# Patient Record
Sex: Male | Born: 1970 | Race: Black or African American | Hispanic: No | Smoking: Current every day smoker
Health system: Southern US, Community
[De-identification: ages and names within clinical notes are randomized; demographics above are authoritative.]

## PROBLEM LIST (undated history)

## (undated) DIAGNOSIS — K5730 Diverticulosis of large intestine without perforation or abscess without bleeding: Secondary | ICD-10-CM

## (undated) DIAGNOSIS — F32A Depression, unspecified: Secondary | ICD-10-CM

## (undated) DIAGNOSIS — F329 Major depressive disorder, single episode, unspecified: Secondary | ICD-10-CM

## (undated) DIAGNOSIS — N390 Urinary tract infection, site not specified: Secondary | ICD-10-CM

## (undated) DIAGNOSIS — K219 Gastro-esophageal reflux disease without esophagitis: Secondary | ICD-10-CM

## (undated) DIAGNOSIS — I10 Essential (primary) hypertension: Secondary | ICD-10-CM

## (undated) DIAGNOSIS — Z8719 Personal history of other diseases of the digestive system: Secondary | ICD-10-CM

## (undated) DIAGNOSIS — F419 Anxiety disorder, unspecified: Secondary | ICD-10-CM

## (undated) HISTORY — DX: Depression, unspecified: F32.A

## (undated) HISTORY — DX: Anxiety disorder, unspecified: F41.9

## (undated) HISTORY — PX: MOUTH SURGERY: SHX715

## (undated) HISTORY — DX: Major depressive disorder, single episode, unspecified: F32.9

---
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
## 2003-10-29 ENCOUNTER — Emergency Department (HOSPITAL_COMMUNITY): Admission: EM | Admit: 2003-10-29 | Discharge: 2003-10-30 | Payer: Self-pay | Admitting: Emergency Medicine

## 2004-01-25 ENCOUNTER — Emergency Department (HOSPITAL_COMMUNITY): Admission: EM | Admit: 2004-01-25 | Discharge: 2004-01-25 | Payer: Self-pay | Admitting: Emergency Medicine

## 2004-09-05 ENCOUNTER — Ambulatory Visit: Payer: Self-pay | Admitting: Internal Medicine

## 2004-09-05 ENCOUNTER — Inpatient Hospital Stay (HOSPITAL_COMMUNITY): Admission: EM | Admit: 2004-09-05 | Discharge: 2004-09-12 | Payer: Self-pay | Admitting: Emergency Medicine

## 2004-09-20 ENCOUNTER — Ambulatory Visit: Payer: Self-pay | Admitting: Internal Medicine

## 2004-11-30 ENCOUNTER — Emergency Department (HOSPITAL_COMMUNITY): Admission: EM | Admit: 2004-11-30 | Discharge: 2004-11-30 | Payer: Self-pay | Admitting: Emergency Medicine

## 2004-12-24 ENCOUNTER — Emergency Department (HOSPITAL_COMMUNITY): Admission: EM | Admit: 2004-12-24 | Discharge: 2004-12-24 | Payer: Self-pay | Admitting: Emergency Medicine

## 2004-12-28 ENCOUNTER — Emergency Department (HOSPITAL_COMMUNITY): Admission: EM | Admit: 2004-12-28 | Discharge: 2004-12-29 | Payer: Self-pay | Admitting: Emergency Medicine

## 2005-05-02 ENCOUNTER — Emergency Department: Payer: Self-pay | Admitting: Emergency Medicine

## 2005-05-03 ENCOUNTER — Emergency Department: Payer: Self-pay | Admitting: Emergency Medicine

## 2005-06-29 ENCOUNTER — Emergency Department (HOSPITAL_COMMUNITY): Admission: EM | Admit: 2005-06-29 | Discharge: 2005-06-29 | Payer: Self-pay | Admitting: Emergency Medicine

## 2005-07-17 ENCOUNTER — Emergency Department: Payer: Self-pay | Admitting: Emergency Medicine

## 2005-10-09 ENCOUNTER — Emergency Department (HOSPITAL_COMMUNITY): Admission: EM | Admit: 2005-10-09 | Discharge: 2005-10-09 | Payer: Self-pay | Admitting: Emergency Medicine

## 2005-12-27 ENCOUNTER — Emergency Department (HOSPITAL_COMMUNITY): Admission: EM | Admit: 2005-12-27 | Discharge: 2005-12-27 | Payer: Self-pay | Admitting: Emergency Medicine

## 2006-07-06 ENCOUNTER — Emergency Department (HOSPITAL_COMMUNITY): Admission: EM | Admit: 2006-07-06 | Discharge: 2006-07-06 | Payer: Self-pay | Admitting: Family Medicine

## 2006-09-10 ENCOUNTER — Emergency Department (HOSPITAL_COMMUNITY): Admission: EM | Admit: 2006-09-10 | Discharge: 2006-09-10 | Payer: Self-pay | Admitting: Emergency Medicine

## 2006-12-11 ENCOUNTER — Inpatient Hospital Stay (HOSPITAL_COMMUNITY): Admission: EM | Admit: 2006-12-11 | Discharge: 2006-12-16 | Payer: Self-pay | Admitting: Emergency Medicine

## 2006-12-14 ENCOUNTER — Encounter (INDEPENDENT_AMBULATORY_CARE_PROVIDER_SITE_OTHER): Payer: Self-pay | Admitting: Gastroenterology

## 2007-05-31 ENCOUNTER — Inpatient Hospital Stay (HOSPITAL_COMMUNITY): Admission: RE | Admit: 2007-05-31 | Discharge: 2007-06-06 | Payer: Self-pay | Admitting: Surgery

## 2007-05-31 ENCOUNTER — Encounter (INDEPENDENT_AMBULATORY_CARE_PROVIDER_SITE_OTHER): Payer: Self-pay | Admitting: Surgery

## 2007-05-31 HISTORY — PX: OTHER SURGICAL HISTORY: SHX169

## 2007-06-15 ENCOUNTER — Emergency Department (HOSPITAL_COMMUNITY): Admission: EM | Admit: 2007-06-15 | Discharge: 2007-06-15 | Payer: Self-pay | Admitting: Family Medicine

## 2007-06-29 ENCOUNTER — Emergency Department (HOSPITAL_COMMUNITY): Admission: EM | Admit: 2007-06-29 | Discharge: 2007-06-29 | Payer: Self-pay | Admitting: Emergency Medicine

## 2007-08-15 ENCOUNTER — Emergency Department (HOSPITAL_COMMUNITY): Admission: EM | Admit: 2007-08-15 | Discharge: 2007-08-15 | Payer: Self-pay | Admitting: Emergency Medicine

## 2007-09-06 ENCOUNTER — Emergency Department (HOSPITAL_COMMUNITY): Admission: EM | Admit: 2007-09-06 | Discharge: 2007-09-06 | Payer: Self-pay | Admitting: Family Medicine

## 2007-09-26 ENCOUNTER — Emergency Department (HOSPITAL_COMMUNITY): Admission: EM | Admit: 2007-09-26 | Discharge: 2007-09-26 | Payer: Self-pay | Admitting: Emergency Medicine

## 2007-11-12 ENCOUNTER — Other Ambulatory Visit: Payer: Self-pay

## 2007-11-12 ENCOUNTER — Emergency Department: Payer: Self-pay | Admitting: Emergency Medicine

## 2007-11-19 ENCOUNTER — Emergency Department (HOSPITAL_COMMUNITY): Admission: EM | Admit: 2007-11-19 | Discharge: 2007-11-19 | Payer: Self-pay | Admitting: Emergency Medicine

## 2007-12-28 ENCOUNTER — Emergency Department: Payer: Self-pay | Admitting: Internal Medicine

## 2007-12-28 ENCOUNTER — Emergency Department (HOSPITAL_COMMUNITY): Admission: EM | Admit: 2007-12-28 | Discharge: 2007-12-29 | Payer: Self-pay | Admitting: Emergency Medicine

## 2008-03-10 ENCOUNTER — Emergency Department (HOSPITAL_COMMUNITY): Admission: EM | Admit: 2008-03-10 | Discharge: 2008-03-10 | Payer: Self-pay | Admitting: Family Medicine

## 2008-04-03 ENCOUNTER — Emergency Department (HOSPITAL_COMMUNITY): Admission: EM | Admit: 2008-04-03 | Discharge: 2008-04-03 | Payer: Self-pay | Admitting: Emergency Medicine

## 2008-06-10 ENCOUNTER — Emergency Department (HOSPITAL_COMMUNITY): Admission: EM | Admit: 2008-06-10 | Discharge: 2008-06-10 | Payer: Self-pay | Admitting: Emergency Medicine

## 2008-07-22 ENCOUNTER — Emergency Department (HOSPITAL_COMMUNITY): Admission: EM | Admit: 2008-07-22 | Discharge: 2008-07-22 | Payer: Self-pay | Admitting: Family Medicine

## 2008-11-20 ENCOUNTER — Emergency Department: Payer: Self-pay | Admitting: Emergency Medicine

## 2009-12-31 ENCOUNTER — Emergency Department: Payer: Self-pay | Admitting: Emergency Medicine

## 2010-01-18 IMAGING — CR DG FINGER THUMB 2+V*L*
3 series · 3 of 3 positions shown · non-contrast
Comparison: None

CLINICAL DATA: Fell this morning.  Pain left MCP region

LEFT THUMB 2+V

[view not recorded (1 of 3)]
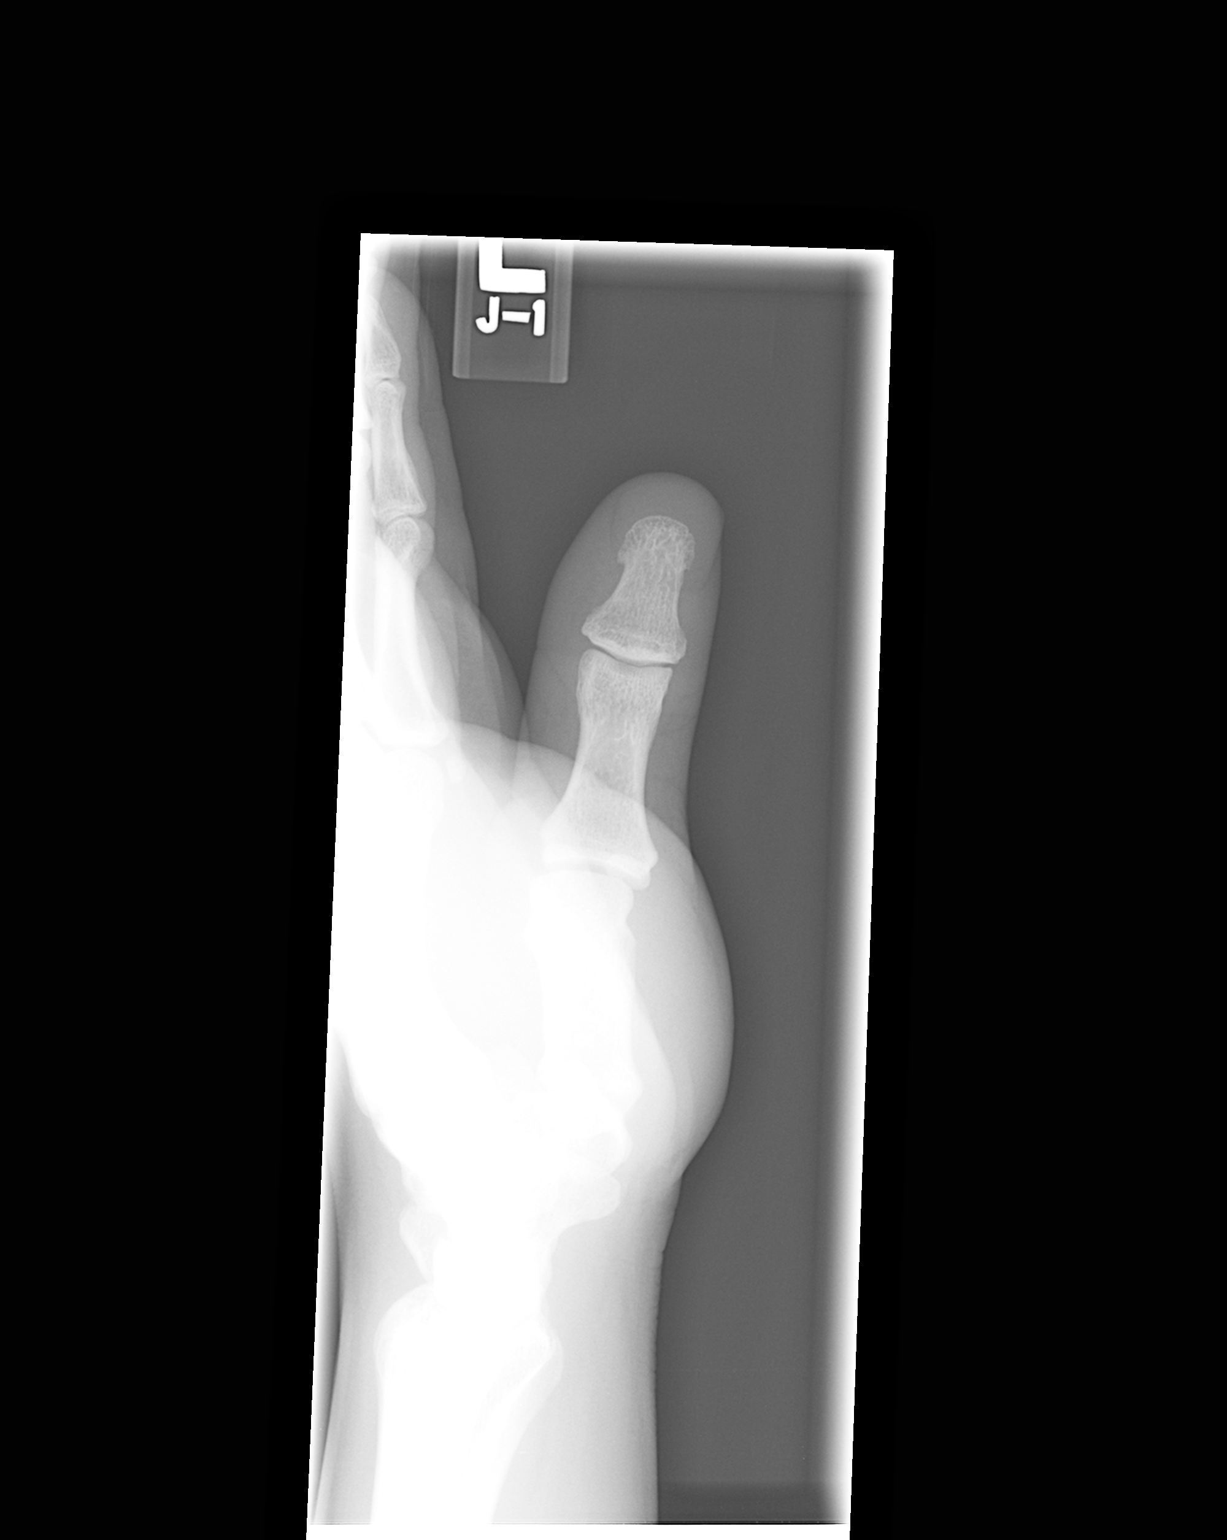

[view not recorded (2 of 3)]
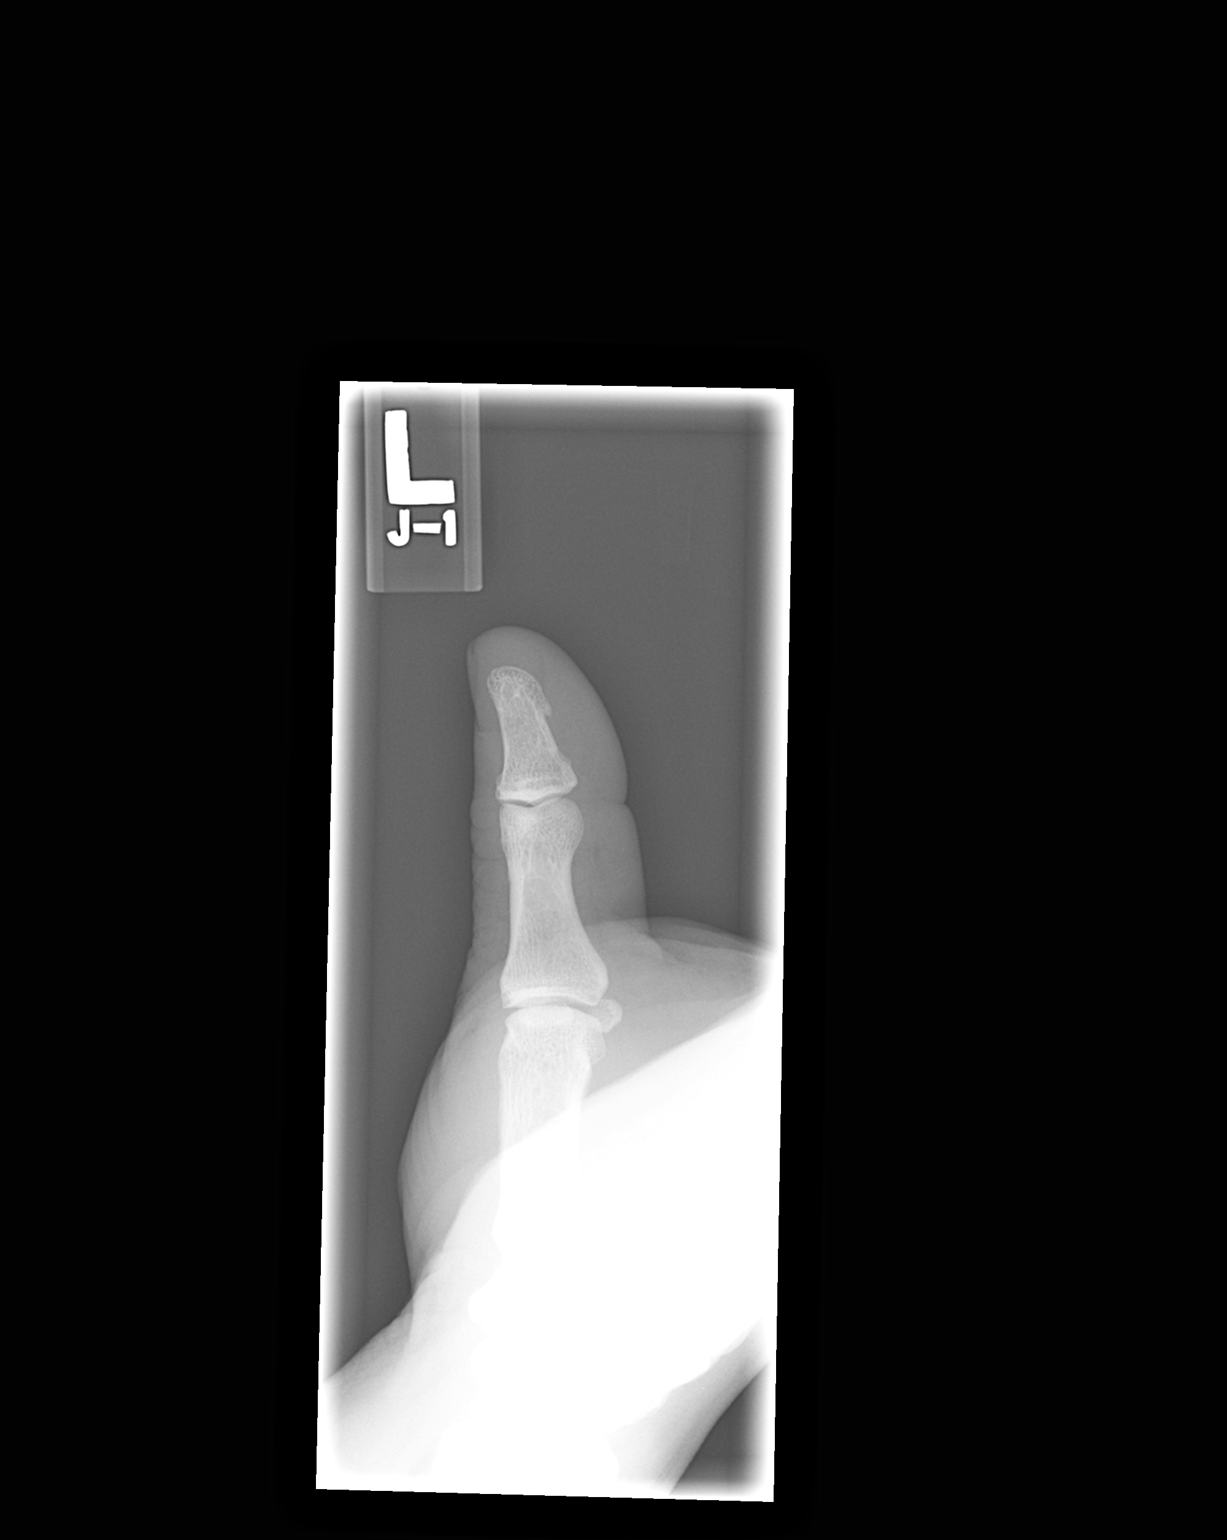

[view not recorded (3 of 3)]
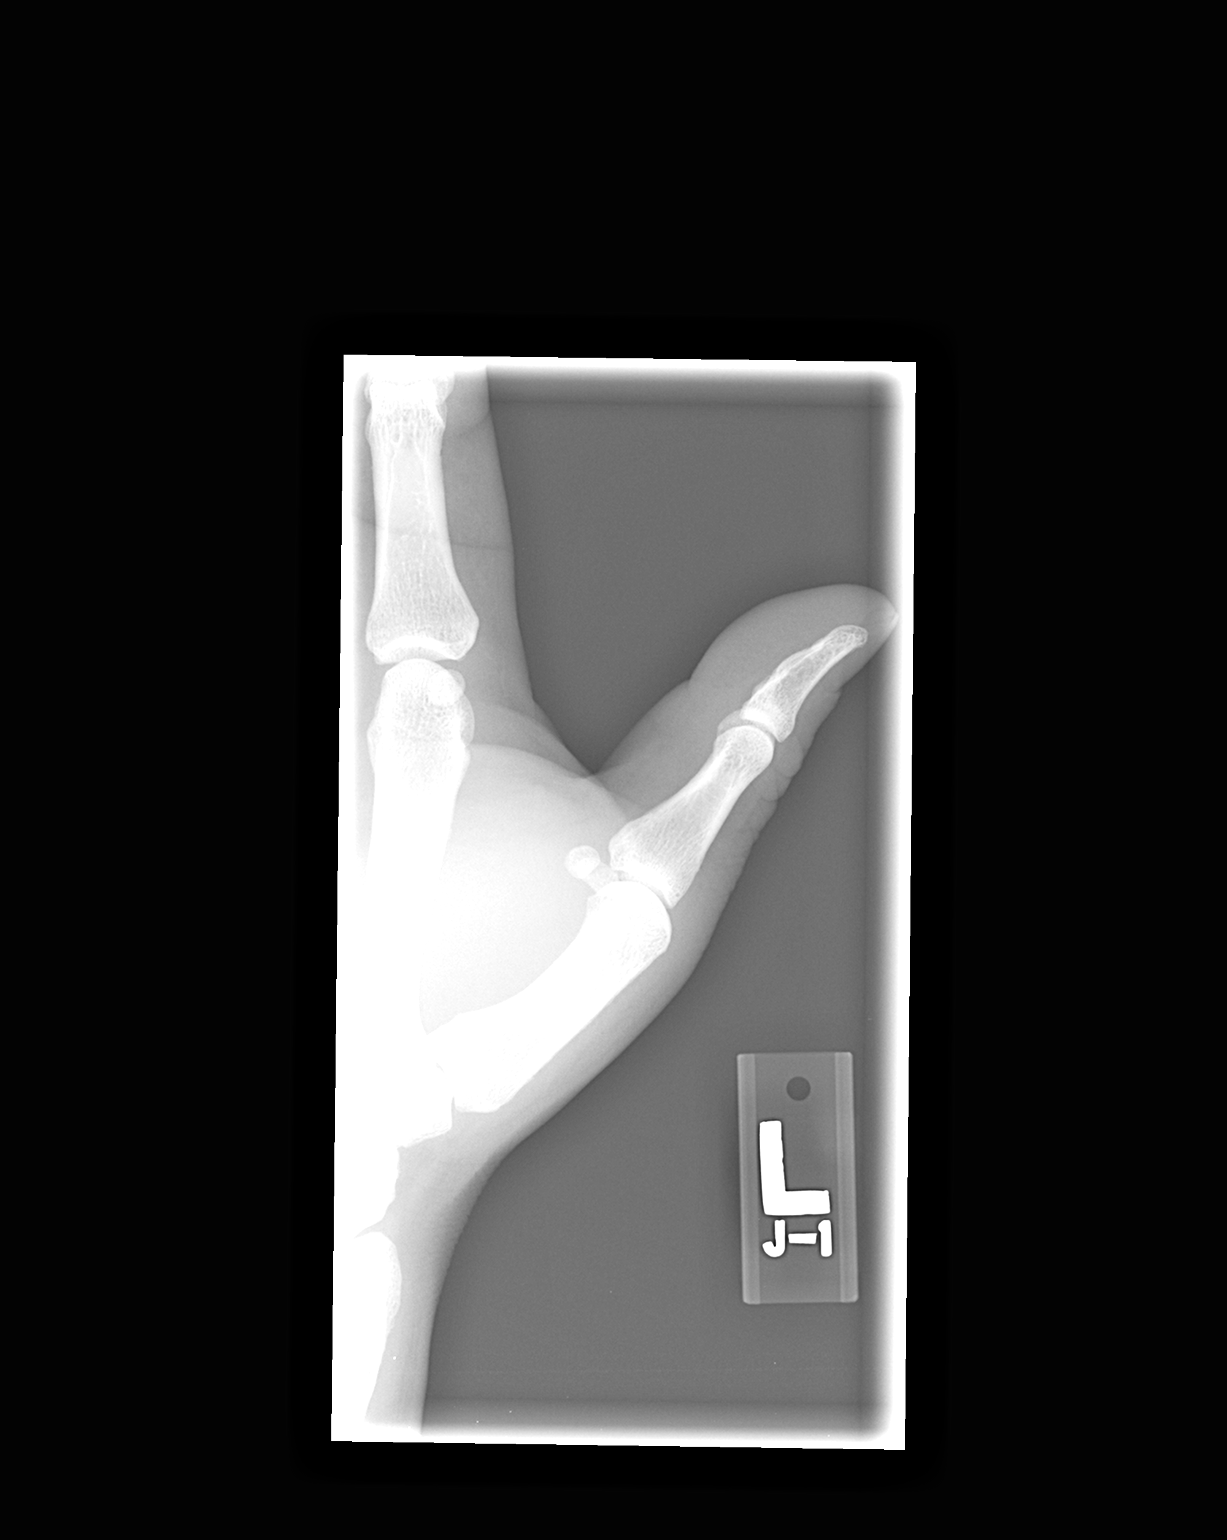

[3 of 3 positions shown; findings below may reference images not displayed]

FINDINGS: No fracture.  On the AP view the proximal phalanx appears
subluxed laterally to a slight degree in relation to the
metacarpal.  I would wonder if this may be due to ligamentous
injury.
IMPRESSION: No fracture.  Query mild pathologic subluxation at the left MCP
articulation.

## 2010-03-26 ENCOUNTER — Emergency Department: Payer: Self-pay | Admitting: Emergency Medicine

## 2010-06-23 LAB — BASIC METABOLIC PANEL
CO2: 24 mEq/L (ref 19–32)
Chloride: 102 mEq/L (ref 96–112)
GFR calc Af Amer: 52 mL/min — ABNORMAL LOW (ref >60)
Glucose, Bld: 118 mg/dL — ABNORMAL HIGH (ref 70–99)
Sodium: 132 mEq/L — ABNORMAL LOW (ref 135–145)

## 2010-06-23 LAB — CBC
HCT: 45.9 % (ref 39.0–52.0)
Hemoglobin: 15.6 g/dL (ref 13.0–17.0)
MCHC: 33.9 g/dL (ref 30.0–36.0)
MCV: 87.3 fL (ref 78.0–100.0)
Platelets: 269 10*3/uL (ref 150–400)
RBC: 5.26 MIL/uL (ref 4.22–5.81)
RDW: 14.4 % (ref 11.5–15.5)
WBC: 10.5 10*3/uL (ref 4.0–10.5)

## 2010-06-27 LAB — CBC
HCT: 45.8 % (ref 39.0–52.0)
Hemoglobin: 14.9 g/dL (ref 13.0–17.0)
MCHC: 32.4 g/dL (ref 30.0–36.0)
MCV: 89.2 fL (ref 78.0–100.0)
RBC: 5.14 MIL/uL (ref 4.22–5.81)

## 2010-06-27 LAB — BASIC METABOLIC PANEL
CO2: 26 mEq/L (ref 19–32)
Calcium: 9.3 mg/dL (ref 8.4–10.5)
Chloride: 104 mEq/L (ref 96–112)
GFR calc Af Amer: >60 mL/min (ref >60)
Potassium: 4.8 mEq/L (ref 3.5–5.1)
Sodium: 137 mEq/L (ref 135–145)

## 2010-06-27 LAB — DIFFERENTIAL
Basophils Relative: 1 % (ref 0–1)
Monocytes Absolute: 0.5 10*3/uL (ref 0.1–1.0)
Monocytes Relative: 10 % (ref 3–12)
Neutro Abs: 2.6 10*3/uL (ref 1.7–7.7)

## 2010-07-26 NOTE — Op Note (Signed)
NAMEMarland Kitchen  GIOVONI, BUNCH                  ACCOUNT NO.:  [id number]   MEDICAL RECORD NO.:  [id number]          PATIENT TYPE:  INP   LOCATION:  5705                         FACILITY:  [facility]   PHYSICIAN:  John C. Madilyn Fireman, M.D.    DATE OF BIRTH:  06/18/1970   DATE OF PROCEDURE:  12/14/2006  DATE OF DISCHARGE:                               OPERATIVE REPORT   INDICATIONS FOR PROCEDURE:  Patient with presentation of a third bout of  acute abdominal pain associated with CT abnormality to the left and  possibly right colon, previously presumed secondary to diverticulitis  but with current skip areas.  Also, suspicious for Crohn's colitis.   PROCEDURE:  The patient was placed in the left lateral decubitus  position and placed on the pulse monitor with continuous low flow oxygen  delivered by nasal cannula.  He was sedated with 100 mcg IV Fentanyl and  10 mg IV Versed, in addition to the 12.5 mg IV Phenergan.  The Olympus  video colonoscope was inserted into the rectum and advanced to the  cecum, confirmed by transillumination of McBurney's point and  visualization of the ileocecal valve and appendiceal orifice.  The prep  was good.  The terminal ileum was intubated and explored for several  centimeters and appeared normal.  Basically, the cecum, ascending, and  transverse colon appeared normal with no masses, polyps, diverticula or  any visible signs of inflammation.  Within the descending and sigmoid  colon there were a few scattered diverticula with no strictures, visible  edema, erythema, or other signs of inflammation.  The rectum likewise  appeared normal and retroflexed view of the anus revealed no obvious  internal hemorrhoids.  Due to the thickening or inflammation suggested  on CT scan, biopsies were taken of the ascending, descending, and  sigmoid colon respectively and sent in separate specimen containers.  The scope was then withdrawn and the patient returned to the recovery  room in stable  condition.  He tolerated the procedure well.  There were  no immediate complications.   IMPRESSION:  Remarkably normal colonoscopy with the exception of sigmoid  diverticulosis.  No definite endoscopic impression of colitis or any  visible abnormality strongly suggestive of diverticulitis with terminal  ileum appearing normal.   PLAN:  We will continue to treat for presumed diverticulitis and await  biopsy results.           ______________________________  Everardo All Madilyn Fireman, M.D.     JCH/MEDQ  D:  12/14/2006  T:  12/14/2006  Job:  [id number]   cc:   [name], MD

## 2010-07-26 NOTE — Discharge Summary (Signed)
NAMEMarland Harmon  RAEF, SPRIGG                  ACCOUNT NO.:  [id number]   MEDICAL RECORD NO.:  [id number]          PATIENT TYPE:  INP   LOCATION:  5705                         FACILITY:  [facility]   PHYSICIAN:  John C. Madilyn Fireman, M.D.    DATE OF BIRTH:  10/11/1970   DATE OF ADMISSION:  12/11/2006  DATE OF DISCHARGE:  12/16/2006                               DISCHARGE SUMMARY   HISTORY OF PRESENT ILLNESS:  The patient is a 40 year old black male who  during evaluation for possible surgical resection of presumed recurrent  diverticulitis as an outpatient developed sudden onset of left-sided  abdominal pain consistent with his two previous presentations and was  admitted for further evaluation.  His CT scan as on previous ones had  shown thickening of the sigmoid colon as well as air in the bowel wall,  but also some inflammation or thickening of the cecum and ascending  colon area raising a question of possible inflammatory bowel disease.  For details, please see admission history and physical.   COURSE IN HOSPITAL:  The patient was placed on Cipro and Flagyl.  He was  in significant pain the first day, but this improved steadily.  His WBC  count was normal and he was not febrile during this admission.  He was  kept on clear liquid diet and on October 2, we decided to prep him for  colonoscopy on October 3.  This was done and surprisingly showed hardly  any mucosal abnormality.  There were sigmoid diverticula noted but no  endoscopically obvious evidence of inflammation and no visible mucosal  inflammatory abnormalities.  The terminal ileum was intubated and  appeared to be within normal limits.  Due to the thickening on CT scan,  biopsy specimens were obtained and sent in separate bottles from the  cecum, splenic flexure area and sigmoid colon.  These were pending at  the time of discharge.  The patient was continued on Cipro and Flagyl  and did well, and on October 3, 4 and 5 his diet was advanced.  He  was  having some small semi-formed bowel movement, and his pain was  significantly improved and well controlled and on October 5 he was felt  ready for discharge.   DISCHARGE DIAGNOSIS:  Probable diverticulitis, rule out inflammatory  bowel disease.   CONDITION ON DISCHARGE:  Improved.   DISCHARGE MEDICATIONS:  1. Cipro 5 mg b.i.d.  2. Flagyl 500 mg b.i.d.  3. Vicodin p.r.n. for pain.   FOLLOWUP:  Follow up with Dr. Gerrit Friends in 2-3 weeks and Dr. Madilyn Fireman in 2-3  weeks.  Will need to reconsider surgical intervention in light of the  endoscopic findings and pending pathology as well as having his third  clinical episode.           ______________________________  Everardo All. Madilyn Fireman, M.D.     JCH/MEDQ  D:  12/16/2006  T:  12/17/2006  Job:  [id number]   cc:   [name], MD  Graylin Shiver, M.D.

## 2010-07-26 NOTE — Consult Note (Signed)
NAMEMarland Kitchen  MICHELL, KADER                  ACCOUNT NO.:  [id number]   MEDICAL RECORD NO.:  [id number]          PATIENT TYPE:  INP   LOCATION:  5705                         FACILITY:  [facility]   PHYSICIAN:  Lennie Muckle, MD      DATE OF BIRTH:  1971-01-10   DATE OF CONSULTATION:  12/11/2006  DATE OF DISCHARGE:                                 CONSULTATION   CONSULTING SURGEON:  Lennie Muckle, MD   Gastroenterology:  GI.  Primary:  None.   REASON FOR CONSULTATION:  Left upper quadrant abdominal pain.   HISTORY OF PRESENT ILLNESS:  [name] is a 40 year old male patient  known to see [facility] from prior hospitalization in 2006 for diverticulitis  with microperforation and left upper quadrant phlegmon.  At that time  the patient had fever and leukocytosis but was treated successfully with  medication and bowel rest.  He was seen during that admission by Dr.  Gerrit Friends.  The patient apparently has been stable until this summer, where  he presented to the ER in June of this year with similar symptoms but  again was stable without significant fever or leukocytosis and was able  to keep a diet down, so he was discharged on Cipro with recommendations  to follow up with Dr. Gerrit Friends as an outpatient.  The patient finally did  follow up with Dr. Gerrit Friends within the past 4-7 days because of increasing  left upper quadrant pain.  I do not have Dr. Ardine Eng notes but the  patient tells me that he did not feel at this time that the patient's  symptoms were coming from diverticular disease and recommended that he  follow up with GI for a colonoscopy.  He is planned to have a  colonoscopy with [facility] this Thursday but because of increased  severity of left upper quadrant pain, the patient states the pain was so  severe he could not walk, he presented to the ER today.  In the ER the  patient had no leukocytosis and a CT was ordered that showed diffuse  colitis with extension up into the terminal ileum, a pattern  consistent  with possible Crohn disease.  He also has a persistent left upper  quadrant phlegmon, no evidence of microdots or free air.  I have  discussed this patient with the EDP as well as with Dr. Freida Busman and we  recommend that gastroenterology admit the patient for further workup of  the colitis, possible Crohn symptoms.   REVIEW OF SYSTEMS:  The patient reports diarrhea that began on June 28.  He relates this to starting a liquid diet on December 08, 2006.  He  denies starting a bowel prep prior to the onset of the diarrhea.  He  denies fevers or chills or any melena or redness in his stool.   SOCIAL HISTORY:  He smokes one to 1-1/2 packs of cigarettes per day.  He  drinks alcohol socially.  He has a girlfriend.  He is disabled because  of his mental health issues.  He has two small children.  FAMILY HISTORY:  Colon cancer, diabetes and hypertension.   PAST MEDICAL HISTORY:  1. Depression.  2. Constipation.  3. Suspected hemorrhoids.   PAST SURGICAL HISTORY:  Hydrocele repair.   ALLERGIES:  No known drug allergies.   CURRENT MEDICATIONS:  Lexapro and Seroquel.  He obtains these from  [facility]   PHYSICAL EXAM:  GENERAL:  He is a pleasant male patient in obvious pain,  complaining of left upper quadrant abdominal pain.  VITAL SIGNS:  Temperature 98.1, BP 134/87, pulse 87, respirations 16.  NEUROLOGIC:  The patient is alert and oriented x3, moving all  extremities x4 without focal deficits.  HEENT:  Head normocephalic.  Sclera are not injected.  NECK:  Supple with no adenopathy.  CHEST:  Bilateral lung sounds clear to auscultation.  Respiratory effort  is nonlabored.  CARDIAC:  S1 and S2 without rubs, murmurs, thrills or gallops.  Pulse is  regular.  ABDOMEN:  Soft.  Bowel sounds are diminished.  He is tender in the left  middle and left upper quadrants with guarding, no rebounding.  EXTREMITIES:  Symmetrical in appearance.  No edema, cyanosis,  clubbing.  SKIN:  The patient has several tattoos on his extremities and one across  his abdomen that I noted.   LABS:  White count 9600, neutrophils 70%, hemoglobin 14.8.  Sodium 137,  potassium 4.8, CO2 24, glucose 93, BUN 9, creatinine 1.17.  LFTs are  normal.  Urinalysis is negative.  Diagnostic CT of the abdomen and  pelvis as noted.   IMPRESSION:  1. Known diverticular disease and prior left upper quadrant phlegmon,      which is currently stable.  2. Left upper quadrant pain with a new finding of colitis extending to      the terminal ileum, suspicious for possible new-onset Crohn      disease.   PLAN:  1. Admission to GI as noted so they can work up and possibly treat the      colitis first.  2. At this time there is no indication for surgery.  We will continue      to follow along with you.  Once the colitis issues are resolved,      then we can possibly reevaluate for colon surgery if indicated.  At      this time, the CT is not signifiantly changed since his last      imagining study and it seems as though his entire colon is affected      rather than a limited area.  3. Follow up on labs and other workup as noted.      Allison L. Kennith Center, MD  Electronically Signed    ALE/MEDQ  D:  12/11/2006  T:  12/12/2006  Job:  [id number]   cc:   [name], MD

## 2010-07-26 NOTE — H&P (Signed)
NAMEMarland Kitchen  Manuel Harmon, Manuel Harmon                  ACCOUNT NO.:  [id number]   MEDICAL RECORD NO.:  [id number]          PATIENT TYPE:  INP   LOCATION:  5705                         FACILITY:  [facility]   PHYSICIAN:  John C. Madilyn Fireman, M.D.    DATE OF BIRTH:  05/18/1970   DATE OF ADMISSION:  12/11/2006  DATE OF DISCHARGE:                              HISTORY & PHYSICAL   CHIEF COMPLAINT:  Abdominal pain.   HISTORY OF ILLNESS:  The patient is a 40 year old black male who  presents with third bout of left lower quadrant abdominal pain with the  first in 2006 requiring hospitalization and the second one treated in  2008 as an outpatient with Cipro and Flagyl.  On his initial  presentation, he had a CT scan which suggested diverticulitis with a  contained perforation.  After his bout treated as an outpatient in June,  he was referred to Dr. Gerrit Friends for possible surgery.  Dr. Gerrit Friends referred  him to Dr. Evette Cristal who set him for a colonoscopy to be scheduled next  week; however, yesterday morning he began having progressive left lower  quadrant abdominal pain, some small squirts of non-bloody diarrhea with  the pain persisting and worsening during the night and this morning to  where he could barely walk.  He came to the emergency room, was afebrile  with stable vital signs but appeared to be in significant distress with  focal significant tenderness in the left lower quadrant.  He had a  normal temperature, normal white blood cell count, but a CT scan showing  inflammation of the sigmoid and splenic flexure area with, again,  suggesting a contained perforation in the same area as seen in 2006, but  also with some ascending colon inflammation, raising the possibility of  a segmental colitis rather than diverticulitis; however, some  diverticula were noted.  The patient was admitted for inpatient  management and pain control.   PAST MEDICAL HISTORY:  1. Diverticulitis.  2. Anxiety and depression.   MEDICATIONS:   Lexapro, Seroquel.   FAMILY HISTORY:  Noncontributory.   SOCIAL HISTORY:  Patient admits to both alcohol and tobacco use.   ALLERGIES:  NONE KNOWN.   PHYSICAL EXAM:  GENERAL:  A well-developed, well-nourished black male in  no acute distress.  HEART:  Regular rate and rhythm without murmur.  LUNGS:  Clear.  ABDOMEN:  Soft, nondistended with normoactive bowel sounds.  No  hepatosplenomegaly or a mass.  There is marked tenderness with guarding  in the left lower quadrant.   IMPRESSION:  Presumed third bout of diverticulitis versus a segmental  colitis.   PLAN:  Admit, treat his diverticulitis, and when improved will need a  colonoscopy, or at least a sigmoidoscopy, to better discern whether he  has inflammatory bowel disease rather than diverticulitis.  If this is  diverticulitis, he will probably need surgery at some point.           ______________________________  Everardo All. Madilyn Fireman, M.D.     JCH/MEDQ  D:  12/11/2006  T:  12/12/2006  Job:  [id number]  cc:   Graylin Shiver, M.D.  [name], MD

## 2010-07-26 NOTE — Op Note (Signed)
NAMEMarland Kitchen  Manuel, Harmon                  ACCOUNT NO.:  [id number]   MEDICAL RECORD NO.:  [id number]          PATIENT TYPE:  INP   LOCATION:  5156                         FACILITY:  [facility]   PHYSICIAN:  [name], MD      DATE OF BIRTH:  [birth year]   DATE OF PROCEDURE:  05/31/2007  DATE OF DISCHARGE:                               OPERATIVE REPORT   PREOPERATIVE DIAGNOSIS:  Diverticular disease, left colon and sigmoid  colon.   POSTOPERATIVE DIAGNOSIS:  Diverticular disease, left colon and sigmoid  colon.   PROCEDURE:  1. Left colectomy.  2. Incidental appendectomy.   SURGEON:  [name], MD, FACS   ASSISTANT:  Consuello Bossier, MD, FACS   ANESTHESIA:  General.   ESTIMATED BLOOD LOSS:  100 mL.   PREPARATION:  Betadine.   COMPLICATIONS:  None.   INDICATIONS:  The patient is a 40 year old black male who had been seen  in September 2008 in our office for recurrent episodes of sigmoid  diverticulitis.  The patient was referred to Dr. Dorena Harmon for  colonoscopy.  However, the patient developed recurrent diverticulitis  and was admitted to [facility].  CT scan demonstrated  perforation at the sigmoid flexure which was contained.  There were  changes consistent with acute diverticulitis.  While hospitalized, the  patient underwent bowel prep and colonoscopy by Dr. Dorena Harmon; this  showed some diverticular disease in the sigmoid colon, but otherwise no  significant of abnormality was identified.  The patient improved on  antibiotic therapy and returned to our practice in February 2009 for  reevaluation for segmental colonic resection for treatment of recurrent  episodes of diverticular disease with history of microperforation at  splenic flexure.  The patient now comes to surgery for resection.   BODY OF REPORT:  Procedure is done in OR #16 at the [facility].  The patient is brought to the operating room and  placed in a supine position on the  operating room table.  Following  administration of general anesthesia, the patient is positioned in low  lithotomy and then prepped and draped in the usual strict aseptic  fashion.  After ascertaining that an adequate level of anesthesia had  been achieved, a low midline abdominal incision was made with a #10  blade.  Dissection was carried down through subcutaneous tissues.  Fascia was incised in the midline and peritoneal cavity is entered  cautiously.  Abdomen is explored.  A Balfour retractor is placed for  exposure.  Exploration reveals minimal diverticular disease involving  the sigmoid colon.  The descending colon is mildly thick-walled.  No  gross abnormality is identified.  Likewise, the splenic flexure and  distal transverse colon is mildly thick-walled, but again without gross  abnormality.  After discussion and review of the CT scans from October  2008 intraoperatively, a decision is made to proceed with left  colectomy.  Therefore, the splenic flexure is mobilized by lifting the  omentum off of the bowel and mobilizing it with the LigaSure.  A point  just  past to midway in the transverse colon is selected and the bowel is  transected with a GIA stapler.  Mesentery is taken down so as to  mobilize the entire splenic flexure.  Mesentery is divided with the  LigaSure as well as 2-0 silk suture ligatures.  Descending colon  mesentery is transected with the LigaSure.  Dissection is carried  distally to the sigmoid colon.  Approximately 80% of the sigmoid colon  is resected.  The distal sigmoid is transected with a GIA stapler.  Specimen is marked with sutures proximally and distally and submitted to  Pathology for review.  Good hemostasis is noted.  Balfour retractor is  repositioned.  The transverse colon is mobilized by dividing selected  mesenteric vessels with the LigaSure, providing for mobilization of the  bowel to reach the distal sigmoid colon.  Small bowel is moved to  the  right side of the abdomen.  Next, an end-to-end anastomosis is created  between the distal transverse colon and the distal sigmoid colon.  Previous staple lines are excised.  Anastomosis is created with  interrupted 3-0 silk sutures circumferentially.  Mesenteric defect is  closed with interrupted 2-0 silk sutures.  No tension is present on the  anastomosis.  Anastomosis admits 2 fingertips easily on palpation.  Abdomen is copiously irrigated with warm saline; this is evacuated.  Good hemostasis is noted.  Appendix is mobilized.  The mesoappendix is  divided with the LigaSure.  Base of the appendix is transected with a  GIA stapler and the appendix is submitted to Pathology for review.  Omentum is used to cover the small bowel and the anastomosis.  Midline  wound is closed with interrupted #1 Novofil sutures.  Subcutaneous  tissues are irrigated.  Skin is closed with stainless steel staples.  Sterile dressings are applied.  The patient is awakened from anesthesia  and brought to the recovery room in stable condition.  The patient  tolerated the procedure well.      [name], MD  Electronically Signed     TMG/MEDQ  D:  05/31/2007  T:  06/01/2007  Job:  [id number]   cc:   Everardo All. Madilyn Fireman, M.D.

## 2010-07-29 NOTE — Discharge Summary (Signed)
NAMEMarland Kitchen  Manuel, Harmon                  ACCOUNT NO.:  [id number]   MEDICAL RECORD NO.:  [id number]          PATIENT TYPE:  INP   LOCATION:  5727                         FACILITY:  [facility]   PHYSICIAN:  Yetta Barre, M.D.  DATE OF BIRTH:  [birth year]   DATE OF ADMISSION:  09/05/2004  DATE OF DISCHARGE:                                 DISCHARGE SUMMARY   No dictation for this job number.       SS/MEDQ  D:  09/12/2004  T:  09/12/2004  Job:  [id number]

## 2010-07-29 NOTE — Discharge Summary (Signed)
NAMEMarland Kitchen  DEJOHN, IBARRA                  ACCOUNT NO.:  [id number]   MEDICAL RECORD NO.:  [id number]          PATIENT TYPE:  INP   LOCATION:  5156                         FACILITY:  [facility]   PHYSICIAN:  [name], MD      DATE OF BIRTH:  [birth year]   DATE OF ADMISSION:  05/31/2007  DATE OF DISCHARGE:  06/06/2007                               DISCHARGE SUMMARY   REASON FOR ADMISSION:  Diverticular disease.   HISTORY OF PRESENT ILLNESS:  Manuel Harmon is a 40 year old black male,  well known to my surgical practice.  He has had recurrent episodes of  sigmoid diverticulitis.  He has developed a diverticular stricture.  He  is admitted at this time for sigmoid colectomy for diverticular disease  with stricture.   HOSPITAL COURSE:  The patient was admitted on May 31, 2007.  He was  taken directly to the operating room where he underwent left colectomy  and incidental appendectomy for diverticular disease.  Postoperative  course was relatively straightforward.  The patient began on a clear  liquid diet on the first postoperative day.  He was advanced to full  liquids on the second postoperative day.  The patient had gradual  resolution of his ileus and was finally advanced to a regular diet on  the fifth postoperative day.  He was discharged to home on June 06, 2007, the sixth postoperative day.   DISCHARGE PLAN:  The patient is discharged to home in good condition,  tolerating a regular diet, and ambulating independently.  Discharge  medications include Percocet as needed for pain.  The patient will be  seen back in my office at [facility] for wound check and  staple removal in 5 days.   FINAL PATHOLOGY:  Benign appendix, diverticulosis, and diverticular  stricture.   CONDITION ON DISCHARGE:  Good.      [name], MD  Electronically Signed     TMG/MEDQ  D:  07/31/2007  T:  08/01/2007  Job:  [id number]

## 2010-07-29 NOTE — Consult Note (Signed)
NAME:  Manuel Harmon, Manuel Harmon                  ACCOUNT NO.:  [id number]   MEDICAL RECORD NO.:  [id number]          PATIENT TYPE:  INP   LOCATION:  1829                         FACILITY:  [facility]   PHYSICIAN:  [name], MD      DATE OF BIRTH:  1971-01-11   DATE OF CONSULTATION:  09/05/2004  DATE OF DISCHARGE:                                   CONSULTATION   HPI: Manuel Harmon is a 40 year old male patient who had a two-day history of  progressive left lower quadrant pain as well as nausea and vomiting.  He  presented to the emergency room where a CT scan confirmed an acute left  ascending colon diverticulitis with tiny contained perforation with no  abscess.  The white blood cell count was 17,000 with a left shift.  The  patient remembers another episode in August of 2005 and he states I came  here and was treated and it resolved.  He did not require hospitalization at  that time.   REVIEW OF SYMPTOMS:  No chest pain, no shortness of breath.  Review of  systems otherwise negative.   ALLERGIES:  No known drug allergies.   MEDICATIONS:  None per patient, although the chart shows that he is on  Lexapro.   PAST MEDICAL HISTORY:  1.  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  2.  History of depression.   SOCIAL HISTORY:  No tobacco, alcohol or illicit drug use.  [place]  He is not married.   FAMILY HISTORY:  His dads history is unknown.  Mom has history of  hypertension.   LABORATORY STUDIES:  Normal lipase.  Normal BMET.  White blood cell count  17,000 with a left shift.   PHYSICAL EXAMINATION:  VITAL SIGNS:  T-max 102.8, blood pressure 144/76,  pulse 110, respirations 24, 97% on room air.  HEENT:  Grossly normal.  Sclerae are clear.  Conjunctivae normal.  Nares  without discharge.  NECK:  No carotid or subclavian bruit.  No JVD or thyromegaly.  LUNGS:  Clear to auscultation bilaterally.  No wheezing or rhonchi.  CARDIOVASCULAR:  Regular rate and rhythm,  no gross murmur, rub or ectopy.  SKIN:  Warm and dry.  ABDOMEN:  Left-sided tenderness.  Good bowel sounds.  Nondistended, no  masses.  NEUROLOGIC:  Cranial nerves II-XII grossly intact.   ASSESSMENT/PLAN:  1.  Acute diverticulitis with microperforations.  Agree with IV Cipro and IV      Flagyl.  Recommend re-CT the patient in three to four days to      reevaluate.  Eventually, because of his recurrent diverticulitis, he      will require a left colon/sigmoid resection and we hopefully can do that      when his infection has cleared in approximately six      to eight weeks.  2.  Depression, treated.   Patient was seen and examined by Dr. [name].       LB/MEDQ  D:  09/05/2004  T:  09/05/2004  Job:  [id number]  cc:   [name], MD  [phone] [address] [place]  [place] [address]

## 2010-07-29 NOTE — Discharge Summary (Signed)
NAMEMarland Kitchen  Manuel Harmon, Manuel Harmon                  ACCOUNT NO.:  [id number]   MEDICAL RECORD NO.:  [id number]          PATIENT TYPE:  INP   LOCATION:  5727                         FACILITY:  [facility]   PHYSICIAN:  Alvester Morin, M.D.  DATE OF BIRTH:  [birth year]   DATE OF ADMISSION:  09/05/2004  DATE OF DISCHARGE:  09/12/2004                                 DISCHARGE SUMMARY   DISCHARGE DIAGNOSIS:  1.  Acute diverticulitis with microperforation.  2.  Bipolar disorder/depression.   MEDICATIONS:  Lexapro 10 mg b.i.d. p.o., Zyprexa 15 mg q.h.s. p.o.,  Ciprofloxacin 500 mg p.o. q.8h., Flagyl 500 mg p.o. t.i.d., Tapazole 40 mg  p.o. q.d.w.c., sodium chloride IV 1000 mL q.7h., Phenergan 12.5 mg q.8h.  p.r.n., Tylenol 650 mg q.4h. p.r.n., Ambien 10 mg p.o. q.h.s. p.r.n.,  nicotine patch 21 mg daily.   PROCEDURES THIS ADMISSION:  None.   CONSULTATIONS:  [name], M.D., surgery.   IMAGING:  1.  CT of the abdomen with contrast done on September 05, 2004, shows minimal      dependent bibasilar atelectasis, normal heart size, trace small      bilateral effusions, the liver, gallbladder, biliary system, pancreas,      adrenal glands, kidneys, and spleen are all normal.  In the left upper      quadrant, there is acute inflammation and surrounding fluid about the      left descending colon.  There is wall thickening of the colon, as well.      A small amount of extraluminal air is suspected along the inflamed left      colon.  The findings are consistent with acute left descending colon      diverticulitis with contained perforation, no evidence of obstruction.      Final impression acute descending colon diverticulitis with contained      perforation and a small amount of entrapped air via adjacent mesentery.  2.  Pelvis CT with contrast shows small amount of pelvic fluid.  3.  CT pelvis with contrast done on September 08, 2004, shows stable CT      appearance of the pelvis.  4.  CT abdomen with contrast done on  September 08, 2004, shows inflammatory      phlegmon predominantly in the left upper quadrant but also tracking down      in the pericolic gutter and retroperitoneum, with reactive      retroperitoneal adenopathy and with increased amount of extraluminal gas      adjacent to the descending colon.  This is consistent with perforated      diverticulitis.   HISTORY OF PRESENT ILLNESS:  Manuel Harmon is a 40 year old male African American  who had a two day history of progressive left lower quadrant pain as well as  nausea and vomiting who presented to the emergency room where a CT scan  confirms an acute left colon diverticulitis with a tiny contained  perforation with no abscess.  The white blood cell count was 17,000 with a  left shift.  The patient remembers another episode in August  2005 and he  states he came here and was treated and it resolved.  He did not require  hospitalization at that time.   ALLERGIES:  No known drug allergies.   PAST MEDICAL HISTORY:  1.  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  2.  History of depression/bipolar disorder.   He is a smoker, he smokes 1 1/2 packs per day.  No history of alcohol or  illicit drug use.  [place].  He is not  married.   FAMILY HISTORY:  Mother has history of hypertension.  Father's history is  unknown.   PHYSICAL EXAMINATION:  Vital signs:  Temperature max 102.8, blood pressure  144/76, pulse 110, respirations 24, O2 saturations 97% on room air.  HEENT:  Grossly normal.  Sclerae clear.  Conjunctivae normal.  Nares without  discharge.  Neck:  No carotid or subclavian bruit, no JVD or thyromegaly.  Lungs:  Clear to auscultation bilaterally.  No wheezing or rhonchi.  Cardiovascular:  Regular rate and rhythm, no gross murmurs, gallops, and  rubs.  Skin warm and dry.  Abdomen:  Left sided tenderness.  Good bowel  sounds. Nondistended, no masses.  Neurological:  Cranial nerves 2-12 grossly  intact.    LABORATORY DATA:  Sodium 134, potassium 3.8, chloride 105, glucose 119, BUN  11, creatinine 1.1, calcium 8.9.  White blood cell count 17, hemoglobin  14.6, hematocrit 43.5, absolute neutrophil 13.1, platelets 293.  Lipase 20.  Prothrombin time 15.6, INR 1.2.  Total bilirubin 0.7, alkaline phos 65, AST  13, ALT 12, total protein 6.3, albumin 3.3.  Stools for occult blood  negative.   HOSPITAL COURSE:  Problem 1:  Acute diverticulitis with microperforation.  The patient was  seen by surgery, Dr. Darnell Level, who recommends conservative management.  The patient is given IV Ciprofloxacin and IV Flagyl, that has been stopped.  The patient is also being given Metronidazole 500 mg IV q.h.s.  Morphine  sulfate 2 mg being given for pain and Promethazine IV q.h.s.   Problem 2:  The patient also a second problem with leukocytosis and that is  secondary to diverticulitis being treated by antibiotics.   Problem 3:  Bipolar and depression.  The patient is being given Zyprexa and  Lexapro daily and the dosages were referred to earlier and Ambien was given  at night.   Problem 4:  GI prophylaxis, IV Protonix.   Problem 5:  DVT prophylaxis, PAS hoses.   ADDENDUM:  For diverticulitis, the patient needs to be seen by surgery to  decide what further management will be.       SS/MEDQ  D:  09/12/2004  T:  09/12/2004  Job:  [id number]

## 2010-10-13 ENCOUNTER — Emergency Department: Payer: Self-pay | Admitting: Internal Medicine

## 2010-10-23 ENCOUNTER — Emergency Department: Payer: Self-pay | Admitting: Emergency Medicine

## 2010-11-06 ENCOUNTER — Emergency Department: Payer: Self-pay | Admitting: Internal Medicine

## 2010-12-05 LAB — CBC
HCT: 44.2
HCT: 45.4
HCT: 46.7
Hemoglobin: 14.9
Hemoglobin: 15.4
Hemoglobin: 15.8
MCHC: 33.7
MCHC: 33.8
MCHC: 33.9
MCV: 86.9
MCV: 87.1
MCV: 87.2
Platelets: 271
Platelets: 287
Platelets: 290
RBC: 5.07
RBC: 5.23
RBC: 5.36
RDW: 14.9
RDW: 15.2
RDW: 15.4
WBC: 15.1 — ABNORMAL HIGH
WBC: 6.8
WBC: 8.5

## 2010-12-05 LAB — BASIC METABOLIC PANEL WITH GFR
BUN: 5 — ABNORMAL LOW
CO2: 29
Calcium: 9.5
Chloride: 103
Creatinine, Ser: 1.34
GFR calc non Af Amer: >60
Glucose, Bld: 126 — ABNORMAL HIGH
Potassium: 4.5
Sodium: 139

## 2010-12-05 LAB — DIFFERENTIAL
Lymphocytes Relative: 32
Lymphs Abs: 2.2
Neutrophils Relative %: 56

## 2010-12-05 LAB — COMPREHENSIVE METABOLIC PANEL
CO2: 29
Calcium: 10
Creatinine, Ser: 1.41
GFR calc Af Amer: >60
GFR calc non Af Amer: 58 — ABNORMAL LOW
Glucose, Bld: 111 — ABNORMAL HIGH

## 2010-12-05 LAB — PROTIME-INR
INR: 1
Prothrombin Time: 13.2

## 2010-12-05 LAB — URINALYSIS, ROUTINE W REFLEX MICROSCOPIC
Nitrite: NEGATIVE
Specific Gravity, Urine: 1.019
Urobilinogen, UA: 0.2

## 2010-12-05 LAB — BASIC METABOLIC PANEL
Calcium: 8.6
GFR calc Af Amer: >60
GFR calc non Af Amer: >60
Glucose, Bld: 111 — ABNORMAL HIGH
Sodium: 135

## 2010-12-08 LAB — GC/CHLAMYDIA PROBE AMP, GENITAL: Chlamydia, DNA Probe: NEGATIVE

## 2010-12-09 LAB — DIFFERENTIAL
Eosinophils Relative: 3
Lymphocytes Relative: 26
Lymphs Abs: 2.2
Monocytes Absolute: 0.5
Monocytes Relative: 5
Neutro Abs: 5.6

## 2010-12-09 LAB — COMPREHENSIVE METABOLIC PANEL
Alkaline Phosphatase: 75
BUN: 13
CO2: 25
Chloride: 106
Creatinine, Ser: 1.37
GFR calc non Af Amer: 59 — ABNORMAL LOW
Potassium: 3.9
Total Bilirubin: 0.9

## 2010-12-09 LAB — URINALYSIS, ROUTINE W REFLEX MICROSCOPIC
Glucose, UA: NEGATIVE
Ketones, ur: 15 — AB
Leukocytes, UA: NEGATIVE
Protein, ur: NEGATIVE
Urobilinogen, UA: 0.2

## 2010-12-09 LAB — CBC
HCT: 47.7
Hemoglobin: 16.5
RBC: 5.49
WBC: 8.5

## 2010-12-12 LAB — URINE CULTURE
Colony Count: NO GROWTH
Culture: NO GROWTH

## 2010-12-12 LAB — BASIC METABOLIC PANEL
BUN: 13
CO2: 29
Chloride: 104
Creatinine, Ser: 1.38
GFR calc Af Amer: >60
Potassium: 3.9

## 2010-12-12 LAB — CBC
HCT: 45.3
MCHC: 33.7
MCV: 86.3
RBC: 5.25
WBC: 5.5

## 2010-12-12 LAB — DIFFERENTIAL
Basophils Relative: 1
Eosinophils Absolute: 0.2
Eosinophils Relative: 5
Lymphs Abs: 2.3
Monocytes Absolute: 0.4
Monocytes Relative: 7
Neutrophils Relative %: 45

## 2010-12-12 LAB — URINALYSIS, ROUTINE W REFLEX MICROSCOPIC
Bilirubin Urine: NEGATIVE
Specific Gravity, Urine: 1.027
pH: 6

## 2010-12-12 LAB — HEPATIC FUNCTION PANEL
ALT: 11
AST: 17
Albumin: 4
Alkaline Phosphatase: 61
Bilirubin, Direct: 0.2
Indirect Bilirubin: 0.7
Total Bilirubin: 0.9
Total Protein: 6.9

## 2010-12-12 LAB — URINE MICROSCOPIC-ADD ON

## 2010-12-12 LAB — LIPASE, BLOOD: Lipase: 21

## 2010-12-14 LAB — POCT URINALYSIS DIP (DEVICE)
Glucose, UA: NEGATIVE
Nitrite: NEGATIVE
Protein, ur: NEGATIVE
Specific Gravity, Urine: 1.02
Urobilinogen, UA: 0.2
pH: 7

## 2010-12-14 LAB — URINE CULTURE

## 2010-12-14 LAB — GC/CHLAMYDIA PROBE AMP, GENITAL: GC Probe Amp, Genital: NEGATIVE

## 2010-12-22 LAB — BASIC METABOLIC PANEL
BUN: 2 — ABNORMAL LOW
CO2: 27
CO2: 29
CO2: 29
Chloride: 102
Chloride: 106
Chloride: 107
Creatinine, Ser: 1.15
Creatinine, Ser: 1.21
Creatinine, Ser: 1.38
GFR calc Af Amer: >60
GFR calc Af Amer: >60
Glucose, Bld: 106 — ABNORMAL HIGH
Glucose, Bld: 117 — ABNORMAL HIGH
Sodium: 136
Sodium: 139

## 2010-12-22 LAB — COMPREHENSIVE METABOLIC PANEL
ALT: 17
Alkaline Phosphatase: 71
Glucose, Bld: 93
Potassium: 4.8
Sodium: 137
Total Protein: 6.3

## 2010-12-22 LAB — OCCULT BLOOD X 1 CARD TO LAB, STOOL: Fecal Occult Bld: POSITIVE

## 2010-12-22 LAB — CBC
Hemoglobin: 13.4
Hemoglobin: 14
Hemoglobin: 14.8
MCHC: 33.8
MCHC: 34.1
MCV: 87.3
MCV: 87.7
MCV: 87.7
MCV: 89.1
Platelets: 278
Platelets: 336
RBC: 4.53
RBC: 4.77
RBC: 5.07
RDW: 14.1 — ABNORMAL HIGH
RDW: 14.5 — ABNORMAL HIGH
RDW: 14.8 — ABNORMAL HIGH
WBC: 11.7 — ABNORMAL HIGH
WBC: 4.4
WBC: 9.6

## 2010-12-22 LAB — URINALYSIS, ROUTINE W REFLEX MICROSCOPIC
Bilirubin Urine: NEGATIVE
Ketones, ur: NEGATIVE
Protein, ur: NEGATIVE
Urobilinogen, UA: 0.2

## 2010-12-22 LAB — DIFFERENTIAL
Basophils Relative: 0
Eosinophils Absolute: 0.2
Monocytes Absolute: 0.7
Monocytes Relative: 7
Neutrophils Relative %: 74

## 2011-09-04 ENCOUNTER — Emergency Department: Payer: Self-pay | Admitting: Emergency Medicine

## 2011-09-04 LAB — BASIC METABOLIC PANEL
Calcium, Total: 8.9 mg/dL (ref 8.5–10.1)
EGFR (African American): >60
EGFR (Non-African Amer.): >60
Glucose: 91 mg/dL (ref 65–99)
Osmolality: 277 (ref 275–301)
Potassium: 3.9 mmol/L (ref 3.5–5.1)
Sodium: 138 mmol/L (ref 136–145)

## 2011-09-04 LAB — TROPONIN I: Troponin-I: <0.02 ng/mL

## 2011-09-04 LAB — CBC
HCT: 45.8 % (ref 40.0–52.0)
HGB: 14.8 g/dL (ref 13.0–18.0)
MCH: 29.1 pg (ref 26.0–34.0)
MCHC: 32.4 g/dL (ref 32.0–36.0)
MCV: 90 fL (ref 80–100)
Platelet: 286 10*3/uL (ref 150–440)
RBC: 5.1 10*6/uL (ref 4.40–5.90)
RDW: 14.3 % (ref 11.5–14.5)
WBC: 6 10*3/uL (ref 3.8–10.6)

## 2011-09-11 ENCOUNTER — Emergency Department: Payer: Self-pay | Admitting: Emergency Medicine

## 2012-07-22 ENCOUNTER — Emergency Department: Payer: Self-pay | Admitting: Emergency Medicine

## 2012-08-09 ENCOUNTER — Emergency Department: Payer: Self-pay | Admitting: Emergency Medicine

## 2012-11-05 ENCOUNTER — Emergency Department: Payer: Self-pay | Admitting: Emergency Medicine

## 2012-11-05 LAB — URINALYSIS, COMPLETE
Bilirubin,UR: NEGATIVE
Blood: NEGATIVE
Leukocyte Esterase: NEGATIVE
Nitrite: NEGATIVE
Ph: 5 (ref 4.5–8.0)
Protein: 30
RBC,UR: 2 /HPF (ref 0–5)
Squamous Epithelial: 2
WBC UR: NONE SEEN /HPF (ref 0–5)

## 2012-11-05 LAB — BASIC METABOLIC PANEL
Calcium, Total: 8.9 mg/dL (ref 8.5–10.1)
Chloride: 108 mmol/L — ABNORMAL HIGH (ref 98–107)
Co2: 29 mmol/L (ref 21–32)
EGFR (Non-African Amer.): >60
Glucose: 135 mg/dL — ABNORMAL HIGH (ref 65–99)
Potassium: 3.9 mmol/L (ref 3.5–5.1)
Sodium: 140 mmol/L (ref 136–145)

## 2012-11-05 LAB — CBC
HCT: 41.5 % (ref 40.0–52.0)
HGB: 13.9 g/dL (ref 13.0–18.0)
Platelet: 298 10*3/uL (ref 150–440)

## 2012-11-16 ENCOUNTER — Emergency Department: Payer: Self-pay | Admitting: Emergency Medicine

## 2013-03-06 ENCOUNTER — Emergency Department: Payer: Self-pay | Admitting: Emergency Medicine

## 2013-03-06 LAB — BASIC METABOLIC PANEL
Anion Gap: 2 — ABNORMAL LOW (ref 7–16)
BUN: 14 mg/dL (ref 7–18)
Calcium, Total: 8.6 mg/dL (ref 8.5–10.1)
Co2: 30 mmol/L (ref 21–32)
Creatinine: 1.86 mg/dL — ABNORMAL HIGH (ref 0.60–1.30)
EGFR (Non-African Amer.): 44 — ABNORMAL LOW
Osmolality: 283 (ref 275–301)
Potassium: 3.7 mmol/L (ref 3.5–5.1)

## 2013-03-06 LAB — CBC WITH DIFFERENTIAL/PLATELET
Basophil #: 0.2 10*3/uL — ABNORMAL HIGH (ref 0.0–0.1)
Basophil %: 4.2 %
Eosinophil #: 0.2 10*3/uL (ref 0.0–0.7)
HCT: 39.8 % — ABNORMAL LOW (ref 40.0–52.0)
Lymphocyte #: 1.9 10*3/uL (ref 1.0–3.6)
MCH: 29.9 pg (ref 26.0–34.0)
MCV: 88 fL (ref 80–100)
Monocyte #: 0.4 x10 3/mm (ref 0.2–1.0)
Neutrophil #: 3 10*3/uL (ref 1.4–6.5)

## 2013-03-06 LAB — URINALYSIS, COMPLETE
Hyaline Cast: 10
Nitrite: NEGATIVE
RBC,UR: 3 /HPF (ref 0–5)
WBC UR: 11 /HPF (ref 0–5)

## 2013-03-06 LAB — TROPONIN I: Troponin-I: <0.02 ng/mL

## 2013-05-26 ENCOUNTER — Emergency Department: Payer: Self-pay | Admitting: Emergency Medicine

## 2013-05-26 LAB — CBC WITH DIFFERENTIAL/PLATELET
Basophil #: 0 10*3/uL (ref 0.0–0.1)
Basophil %: 0.3 %
Eosinophil #: 0.3 10*3/uL (ref 0.0–0.7)
Eosinophil %: 4.9 %
HCT: 43.6 % (ref 40.0–52.0)
HGB: 14.1 g/dL (ref 13.0–18.0)
LYMPHS ABS: 2.4 10*3/uL (ref 1.0–3.6)
Lymphocyte %: 43 %
MCH: 29.1 pg (ref 26.0–34.0)
MCHC: 32.3 g/dL (ref 32.0–36.0)
MCV: 90 fL (ref 80–100)
Monocyte #: 0.4 x10 3/mm (ref 0.2–1.0)
Monocyte %: 7.7 %
NEUTROS ABS: 2.4 10*3/uL (ref 1.4–6.5)
Neutrophil %: 44.1 %
PLATELETS: 270 10*3/uL (ref 150–440)
RBC: 4.84 10*6/uL (ref 4.40–5.90)
RDW: 14.6 % — AB (ref 11.5–14.5)
WBC: 5.5 10*3/uL (ref 3.8–10.6)

## 2013-05-26 LAB — URINALYSIS, COMPLETE
BACTERIA: NONE SEEN
BLOOD: NEGATIVE
Bilirubin,UR: NEGATIVE
GLUCOSE, UR: NEGATIVE mg/dL (ref 0–75)
LEUKOCYTE ESTERASE: NEGATIVE
Nitrite: NEGATIVE
PH: 5 (ref 4.5–8.0)
Protein: NEGATIVE
RBC,UR: 3 /HPF (ref 0–5)
SPECIFIC GRAVITY: 1.031 (ref 1.003–1.030)
Squamous Epithelial: 2

## 2013-05-26 LAB — BASIC METABOLIC PANEL
ANION GAP: 3 — AB (ref 7–16)
BUN: 19 mg/dL — ABNORMAL HIGH (ref 7–18)
Calcium, Total: 8.9 mg/dL (ref 8.5–10.1)
Chloride: 107 mmol/L (ref 98–107)
Co2: 30 mmol/L (ref 21–32)
Creatinine: 1.4 mg/dL — ABNORMAL HIGH (ref 0.60–1.30)
EGFR (Non-African Amer.): >60
Glucose: 116 mg/dL — ABNORMAL HIGH (ref 65–99)
OSMOLALITY: 283 (ref 275–301)
POTASSIUM: 3.8 mmol/L (ref 3.5–5.1)
SODIUM: 140 mmol/L (ref 136–145)

## 2013-05-26 LAB — TROPONIN I

## 2013-06-08 ENCOUNTER — Emergency Department: Payer: Self-pay | Admitting: Internal Medicine

## 2013-07-18 ENCOUNTER — Emergency Department: Payer: Self-pay | Admitting: Emergency Medicine

## 2013-12-31 ENCOUNTER — Ambulatory Visit (INDEPENDENT_AMBULATORY_CARE_PROVIDER_SITE_OTHER): Payer: Self-pay | Admitting: Surgery

## 2014-01-09 ENCOUNTER — Emergency Department: Payer: Self-pay | Admitting: Student

## 2014-01-09 LAB — COMPREHENSIVE METABOLIC PANEL
ALBUMIN: 3.6 g/dL (ref 3.4–5.0)
ALK PHOS: 70 U/L
ANION GAP: 6 — AB (ref 7–16)
BILIRUBIN TOTAL: 0.2 mg/dL (ref 0.2–1.0)
BUN: 15 mg/dL (ref 7–18)
Calcium, Total: 8.5 mg/dL (ref 8.5–10.1)
Chloride: 111 mmol/L — ABNORMAL HIGH (ref 98–107)
Co2: 29 mmol/L (ref 21–32)
Creatinine: 1.26 mg/dL (ref 0.60–1.30)
EGFR (African American): >60
Glucose: 119 mg/dL — ABNORMAL HIGH (ref 65–99)
OSMOLALITY: 293 (ref 275–301)
Potassium: 3.8 mmol/L (ref 3.5–5.1)
SGOT(AST): 10 U/L — ABNORMAL LOW (ref 15–37)
SGPT (ALT): 18 U/L
Sodium: 146 mmol/L — ABNORMAL HIGH (ref 136–145)
TOTAL PROTEIN: 6.9 g/dL (ref 6.4–8.2)

## 2014-01-09 LAB — CBC WITH DIFFERENTIAL/PLATELET
Basophil #: 0.1 10*3/uL (ref 0.0–0.1)
Basophil %: 1.3 %
Eosinophil #: 0.2 10*3/uL (ref 0.0–0.7)
Eosinophil %: 4.2 %
HCT: 42.2 % (ref 40.0–52.0)
HGB: 13.8 g/dL (ref 13.0–18.0)
LYMPHS ABS: 2.2 10*3/uL (ref 1.0–3.6)
Lymphocyte %: 38.5 %
MCH: 29.3 pg (ref 26.0–34.0)
MCHC: 32.6 g/dL (ref 32.0–36.0)
MCV: 90 fL (ref 80–100)
Monocyte #: 0.4 x10 3/mm (ref 0.2–1.0)
Monocyte %: 7.7 %
NEUTROS ABS: 2.8 10*3/uL (ref 1.4–6.5)
Neutrophil %: 48.3 %
Platelet: 288 10*3/uL (ref 150–440)
RBC: 4.7 10*6/uL (ref 4.40–5.90)
RDW: 14.8 % — AB (ref 11.5–14.5)
WBC: 5.8 10*3/uL (ref 3.8–10.6)

## 2014-01-09 LAB — URINALYSIS, COMPLETE
BILIRUBIN, UR: NEGATIVE
Bacteria: NONE SEEN
Glucose,UR: NEGATIVE mg/dL (ref 0–75)
Leukocyte Esterase: NEGATIVE
Nitrite: NEGATIVE
Ph: 5 (ref 4.5–8.0)
RBC,UR: 4 /HPF (ref 0–5)
Specific Gravity: 1.029 (ref 1.003–1.030)
Squamous Epithelial: 1
WBC UR: 2 /HPF (ref 0–5)

## 2014-01-10 LAB — LIPASE, BLOOD: Lipase: 164 U/L (ref 73–393)

## 2014-01-11 LAB — URINE CULTURE

## 2014-01-12 LAB — GC/CHLAMYDIA PROBE AMP

## 2014-01-13 ENCOUNTER — Encounter (HOSPITAL_BASED_OUTPATIENT_CLINIC_OR_DEPARTMENT_OTHER): Payer: Self-pay | Admitting: *Deleted

## 2014-01-13 NOTE — Progress Notes (Signed)
NPO AFTER MN.  ARRIVE AT 0830.  NEEDS ISTAT AND EKG.   

## 2014-01-15 ENCOUNTER — Encounter (HOSPITAL_BASED_OUTPATIENT_CLINIC_OR_DEPARTMENT_OTHER)
Admission: RE | Disposition: A | Discharge status: Home / Self Care | Payer: Medicaid Other | Source: Ambulatory Visit | Attending: Surgery

## 2014-01-15 ENCOUNTER — Ambulatory Visit (HOSPITAL_BASED_OUTPATIENT_CLINIC_OR_DEPARTMENT_OTHER): Payer: Medicaid Other | Admitting: Anesthesiology

## 2014-01-15 ENCOUNTER — Other Ambulatory Visit: Payer: Self-pay

## 2014-01-15 ENCOUNTER — Ambulatory Visit (HOSPITAL_BASED_OUTPATIENT_CLINIC_OR_DEPARTMENT_OTHER)
Admission: RE | Admit: 2014-01-15 | Discharge: 2014-01-15 | Disposition: A | Discharge status: Home / Self Care | Payer: Medicaid Other | Source: Ambulatory Visit | Attending: Surgery | Admitting: Surgery

## 2014-01-15 ENCOUNTER — Encounter (HOSPITAL_BASED_OUTPATIENT_CLINIC_OR_DEPARTMENT_OTHER): Payer: Self-pay | Admitting: Surgery

## 2014-01-15 DIAGNOSIS — L905 Scar conditions and fibrosis of skin: Secondary | ICD-10-CM | POA: Diagnosis not present

## 2014-01-15 DIAGNOSIS — R109 Unspecified abdominal pain: Secondary | ICD-10-CM | POA: Diagnosis present

## 2014-01-15 DIAGNOSIS — I10 Essential (primary) hypertension: Secondary | ICD-10-CM | POA: Diagnosis not present

## 2014-01-15 DIAGNOSIS — Z4802 Encounter for removal of sutures: Secondary | ICD-10-CM | POA: Insufficient documentation

## 2014-01-15 DIAGNOSIS — Z9049 Acquired absence of other specified parts of digestive tract: Secondary | ICD-10-CM | POA: Insufficient documentation

## 2014-01-15 DIAGNOSIS — F17210 Nicotine dependence, cigarettes, uncomplicated: Secondary | ICD-10-CM | POA: Insufficient documentation

## 2014-01-15 DIAGNOSIS — T8189XA Other complications of procedures, not elsewhere classified, initial encounter: Secondary | ICD-10-CM | POA: Diagnosis present

## 2014-01-15 HISTORY — DX: Personal history of other diseases of the digestive system: Z87.19

## 2014-01-15 HISTORY — PX: SCAR REVISION: SHX5285

## 2014-01-15 HISTORY — DX: Urinary tract infection, site not specified: N39.0

## 2014-01-15 HISTORY — DX: Diverticulosis of large intestine without perforation or abscess without bleeding: K57.30

## 2014-01-15 SURGERY — REVISION, SCAR
Anesthesia: General | Site: Abdomen

## 2014-01-15 MED ORDER — LACTATED RINGERS IV SOLN
INTRAVENOUS | Status: DC
  Administered 2014-01-15: 10:00:00 via INTRAVENOUS
  Filled 2014-01-15: qty 1000

## 2014-01-15 MED ORDER — FENTANYL CITRATE 0.05 MG/ML IJ SOLN
INTRAMUSCULAR | Status: DC | PRN
  Administered 2014-01-15 (×3): 25 ug via INTRAVENOUS
  Administered 2014-01-15: 50 ug via INTRAVENOUS

## 2014-01-15 MED ORDER — LIDOCAINE HCL (CARDIAC) 20 MG/ML IV SOLN
INTRAVENOUS | Status: DC | PRN
  Administered 2014-01-15: 50 mg via INTRAVENOUS

## 2014-01-15 MED ORDER — SODIUM CHLORIDE 0.9 % IR SOLN
Status: DC | PRN
Start: 2014-01-15 — End: 2014-01-15
  Administered 2014-01-15: 500 mL

## 2014-01-15 MED ORDER — FENTANYL CITRATE 0.05 MG/ML IJ SOLN
INTRAMUSCULAR | Status: AC
  Filled 2014-01-15: qty 2

## 2014-01-15 MED ORDER — FENTANYL CITRATE 0.05 MG/ML IJ SOLN
INTRAMUSCULAR | Status: AC
  Filled 2014-01-15: qty 6

## 2014-01-15 MED ORDER — CEFAZOLIN SODIUM-DEXTROSE 2-3 GM-% IV SOLR
INTRAVENOUS | Status: AC
  Filled 2014-01-15: qty 50

## 2014-01-15 MED ORDER — CEFAZOLIN SODIUM-DEXTROSE 2-3 GM-% IV SOLR
2.0000 g | INTRAVENOUS | Status: AC
  Administered 2014-01-15: 2 g via INTRAVENOUS
  Filled 2014-01-15: qty 50

## 2014-01-15 MED ORDER — HYDROCODONE-ACETAMINOPHEN 5-325 MG PO TABS
ORAL_TABLET | ORAL | Status: AC
  Filled 2014-01-15: qty 1

## 2014-01-15 MED ORDER — FENTANYL CITRATE 0.05 MG/ML IJ SOLN
25.0000 ug | INTRAMUSCULAR | Status: DC | PRN
  Administered 2014-01-15: 25 ug via INTRAVENOUS
  Filled 2014-01-15: qty 1

## 2014-01-15 MED ORDER — PROPOFOL 10 MG/ML IV BOLUS
INTRAVENOUS | Status: DC | PRN
  Administered 2014-01-15: 200 mg via INTRAVENOUS

## 2014-01-15 MED ORDER — HYDROCODONE-ACETAMINOPHEN 5-325 MG PO TABS: 1.0000 | ORAL_TABLET | ORAL | Status: DC | PRN

## 2014-01-15 MED ORDER — ONDANSETRON HCL 4 MG/2ML IJ SOLN
INTRAMUSCULAR | Status: DC | PRN
Start: 2014-01-15 — End: 2014-01-15
  Administered 2014-01-15: 4 mg via INTRAVENOUS

## 2014-01-15 MED ORDER — DEXAMETHASONE SODIUM PHOSPHATE 4 MG/ML IJ SOLN
INTRAMUSCULAR | Status: DC | PRN
  Administered 2014-01-15: 8 mg via INTRAVENOUS

## 2014-01-15 MED ORDER — HYDROCODONE-ACETAMINOPHEN 5-325 MG PO TABS
1.0000 | ORAL_TABLET | ORAL | Status: DC | PRN
  Administered 2014-01-15: 1 via ORAL
  Filled 2014-01-15: qty 2

## 2014-01-15 MED ORDER — MIDAZOLAM HCL 2 MG/2ML IJ SOLN
INTRAMUSCULAR | Status: AC
  Filled 2014-01-15: qty 2

## 2014-01-15 MED ORDER — BUPIVACAINE HCL (PF) 0.25 % IJ SOLN
INTRAMUSCULAR | Status: DC | PRN
  Administered 2014-01-15: 20 mL

## 2014-01-15 MED ORDER — MIDAZOLAM HCL 5 MG/5ML IJ SOLN
INTRAMUSCULAR | Status: DC | PRN
  Administered 2014-01-15: 2 mg via INTRAVENOUS

## 2014-01-15 SURGICAL SUPPLY — 44 items
BENZOIN TINCTURE PRP APPL 2/3 (GAUZE/BANDAGES/DRESSINGS) ×3 IMPLANT
BLADE CLIPPER SURG (BLADE) ×3 IMPLANT
BLADE SURG 15 STRL LF DISP TIS (BLADE) ×1 IMPLANT
BLADE SURG 15 STRL SS (BLADE) ×2
CANISTER SUCTION 1200CC (MISCELLANEOUS) IMPLANT
CHLORAPREP W/TINT 26ML (MISCELLANEOUS) ×3 IMPLANT
CLEANER CAUTERY TIP 5X5 PAD (MISCELLANEOUS) ×1 IMPLANT
CLOSURE WOUND 1/2 X4 (GAUZE/BANDAGES/DRESSINGS) ×1
CLOTH BEACON ORANGE TIMEOUT ST (SAFETY) IMPLANT
COVER MAYO STAND STRL (DRAPES) ×3 IMPLANT
COVER TABLE BACK 60X90 (DRAPES) ×3 IMPLANT
DRAIN PENROSE 18X1/2 LTX STRL (DRAIN) IMPLANT
DRAPE PED LAPAROTOMY (DRAPES) ×3 IMPLANT
ELECT REM PT RETURN 9FT ADLT (ELECTROSURGICAL) ×3
ELECTRODE REM PT RTRN 9FT ADLT (ELECTROSURGICAL) ×1 IMPLANT
GAUZE SPONGE 4X4 12PLY STRL LF (GAUZE/BANDAGES/DRESSINGS) IMPLANT
GLOVE BIO SURGEON STRL SZ 6.5 (GLOVE) ×2 IMPLANT
GLOVE BIO SURGEONS STRL SZ 6.5 (GLOVE) ×1
GLOVE BIOGEL PI IND STRL 6.5 (GLOVE) ×2 IMPLANT
GLOVE BIOGEL PI IND STRL 7.5 (GLOVE) ×1 IMPLANT
GLOVE BIOGEL PI INDICATOR 6.5 (GLOVE) ×4
GLOVE BIOGEL PI INDICATOR 7.5 (GLOVE) ×2
GLOVE SURG ORTHO 8.0 STRL STRW (GLOVE) ×3 IMPLANT
GLOVE SURG SS PI 7.5 STRL IVOR (GLOVE) ×3 IMPLANT
GOWN STRL REIN XL XLG (GOWN DISPOSABLE) ×6 IMPLANT
NEEDLE HYPO 25X1 1.5 SAFETY (NEEDLE) ×3 IMPLANT
NS IRRIG 500ML POUR BTL (IV SOLUTION) ×3 IMPLANT
PACK BASIN DAY SURGERY FS (CUSTOM PROCEDURE TRAY) ×3 IMPLANT
PAD CLEANER CAUTERY TIP 5X5 (MISCELLANEOUS) ×2
PAD PREP 24X48 CUFFED NSTRL (MISCELLANEOUS) IMPLANT
PENCIL BUTTON HOLSTER BLD 10FT (ELECTRODE) ×3 IMPLANT
SPONGE GAUZE 4X4 12PLY STER LF (GAUZE/BANDAGES/DRESSINGS) ×3 IMPLANT
STRIP CLOSURE SKIN 1/2X4 (GAUZE/BANDAGES/DRESSINGS) ×2 IMPLANT
SUT MNCRL AB 4-0 PS2 18 (SUTURE) IMPLANT
SUT NOVA NAB GS-22 2 0 T19 (SUTURE) ×3 IMPLANT
SUT PROLENE 0 CT 1 CR/8 (SUTURE) IMPLANT
SUT SILK 2 0 SH (SUTURE) IMPLANT
SUT SILK 2 0 TIES 17X18 (SUTURE)
SUT SILK 2-0 18XBRD TIE BLK (SUTURE) IMPLANT
SUT VIC AB 3-0 SH 18 (SUTURE) ×3 IMPLANT
SUT VICRYL 4-0 PS2 18IN ABS (SUTURE) ×3 IMPLANT
SYR CONTROL 10ML LL (SYRINGE) ×3 IMPLANT
WATER STERILE IRR 500ML POUR (IV SOLUTION) IMPLANT
YANKAUER SUCT BULB TIP NO VENT (SUCTIONS) IMPLANT

## 2014-01-15 NOTE — Anesthesia Postprocedure Evaluation (Signed)
  Anesthesia Post-op Note  Patient: Manuel Harmon  Procedure(s) Performed: Procedure(s) (LRB): SCAR REVISION AND REMOVAL OF SUTURE MATERIAL FROM ABDOMINAL WALL (N/A)  Patient Location: PACU  Anesthesia Type: General  Level of Consciousness: awake and alert   Airway and Oxygen Therapy: Patient Spontanous Breathing  Post-op Pain: mild  Post-op Assessment: Post-op Vital signs reviewed, Patient's Cardiovascular Status Stable, Respiratory Function Stable, Patent Airway and No signs of Nausea or vomiting  Last Vitals:  Filed Vitals:   01/15/14 1200  BP: 131/72  Pulse: 59  Temp:   Resp: 11    Post-op Vital Signs: stable   Complications: No apparent anesthesia complications

## 2014-01-15 NOTE — Discharge Instructions (Signed)

## 2014-01-15 NOTE — Brief Op Note (Signed)
01/15/2014  10:49 AM  PATIENT:  Manuel Harmon  43 y.o. male  PRE-OPERATIVE DIAGNOSIS:  suture reacton, abdominal pain  POST-OPERATIVE DIAGNOSIS:  suture reacton, abdominal pain  PROCEDURE:  Procedure(s): SCAR REVISION AND REMOVAL OF SUTURE MATERIAL FROM ABDOMINAL WALL (N/A)  SURGEON:  Surgeon(s) and Role:    * Darnell Levelodd Starlet Gallentine, MD - Primary  ANESTHESIA:   general  EBL:  Total I/O In: 400 [I.V.:400] Out: -   BLOOD ADMINISTERED:none  DRAINS: none   LOCAL MEDICATIONS USED:  MARCAINE     SPECIMEN:  No Specimen  DISPOSITION OF SPECIMEN:  N/A  COUNTS:  YES  TOURNIQUET:  * No tourniquets in log *  DICTATION: .Other Dictation: Dictation Number [id number]  PLAN OF CARE: Discharge to home after PACU  PATIENT DISPOSITION:  PACU - hemodynamically stable.   Delay start of Pharmacological VTE agent (>24hrs) due to surgical blood loss or risk of bleeding: yes  Manuel Hecklerodd M. Naima Veldhuizen, MD, [facility] Office: [phone]

## 2014-01-15 NOTE — Op Note (Signed)
NAME:  Manuel AlbaKING, Manuel Harmon                   ACCOUNT NO.:  [id number]  MEDICAL RECORD NO.:  [id number]  LOCATION:                                 FACILITY:  PHYSICIAN:  Velora Hecklerodd M Marti Acebo, MD      DATE OF BIRTH:  Sep 24, 1970  DATE OF PROCEDURE:  01/15/2014                              OPERATIVE REPORT   PREOPERATIVE DIAGNOSIS:  Retained suture material, abdominal pain.  POSTOPERATIVE DIAGNOSIS:  Retained suture material, abdominal pain.  PROCEDURE:  Excision of retained suture material, scar revision.  SURGEON:  Velora Hecklerodd M Janaisha Tolsma, MD, FACS  ANESTHESIA:  General.  ESTIMATED BLOOD LOSS:  Minimal.  PREPARATION:  ChloraPrep.  COMPLICATIONS:  None.  INDICATIONS:  The patient is a 43 year old male who underwent left colectomy in 2009.  The patient's midline abdominal incision was closed with permanent suture material.  The patient has developed intermittent episodes of discomfort along the suture line primarily in the area of the umbilicus.  The patient is quite thin and suture material was palpable in the subcutaneous tissues.  The patient now comes to Surgery for scar revision and removal of suture material.  BODY OF REPORT:  Pro[facility].  The patient was brought to the operating room, placed in supine position on the operating room table.  Following administration of general anesthesia, the patient was prepped and draped in the usual aseptic fashion.  After ascertaining that an adequate level of anesthesia had been achieved, the previous scar was excised with a #15 blade from the cephalad portion of the midline incision to approximately 3 cm below the level of the umbilicus.  The scar was completely excised and discarded.  Dissection was carried down through subcutaneous tissue and scar until the permanent sutures in the midline fascia are identified.  These were then extracted.  Good hemostasis was achieved with the  electrocautery.  Subcutaneous tissues were closed with interrupted 3-0 Vicryl sutures. Skin was closed with a running 4-0 Monocryl subcuticular suture.  Local field block was placed with Marcaine.  Wound was washed and dried and benzoin and Steri-Strips were applied.  Sterile dressings were applied. The patient was awakened from anesthesia and brought to the recovery room.  The patient tolerated the procedure well.   Velora Hecklerodd M. Antonius Hartlage, MD, [facility] Office: [phone]    TMG/MEDQ  D:  01/15/2014  T:  01/15/2014  Job:  [id number]

## 2014-01-15 NOTE — H&P (Signed)
General Surgery [facility]  Manuel MediateRalph J. Harmon 12/31/2013 9:43 AM Location: [facility] Patient #: [id number] DOB: 06/09/1970 Married / Language: English / Race: Black or African American Male  History of Present Illness Manuel Harmon(Manuel Harmon M. Manuel Swango MD; 12/31/2013 9:57 AM) Patient words: post-op.  The patient is a 43 year old male who presents with non-malignant abdominal pain.  Patient referred by Dr. Maryruth HancockSallie Harmon at [facility] [place].  The patient complains of peri-umbilical pain.  The patient underwent left colectomy for diverticular disease in March of 2009. He also had incidental appendectomy at that time. Abdominal incision was closed with permanent suture material. Patient has had intermittent episodes of discomfort along the suture line, primarily around the area of the umbilicus. Because he is very thin, he can palpate the permanent suture material and it causes discomfort. He presents today for evaluation and recommendations for management.   Other Problems Manuel Harmon(Manuel Harmon, CMA; 12/31/2013 9:44 AM) Diverticulosis High blood pressure  Past Surgical History Manuel Harmon(Manuel Harmon, CMA; 12/31/2013 9:44 AM) Resection of Stomach  Diagnostic Studies History Manuel Harmon(Manuel Harmon, CMA; 12/31/2013 9:44 AM) Colonoscopy 1-5 years ago  Allergies Manuel Harmon(Manuel Harmon, CMA; 12/31/2013 9:43 AM) Wellbutrin *ANTIDEPRESSANTS* Ibuprin *ANALGESICS - ANTI-INFLAMMATORY*  Medication History (Manuel Harmon, CMA; 12/31/2013 9:43 AM) Flonase Allergy Relief (Nasal) Specific dose unknown - Active. Lisinopril (Oral) Specific dose unknown - Active. ClonazePAM ODT (Oral) Specific dose unknown - Active. Remeron (Oral) Specific dose unknown - Active.  Social History Manuel Harmon(Manuel Harmon, CMA; 12/31/2013 9:44 AM) Alcohol use Occasional alcohol use. Caffeine use Coffee. No drug use Tobacco use Current every day smoker.  Family History Manuel Harmon(Manuel Harmon, CMA; 12/31/2013  9:44 AM) Alcohol Abuse Father, Sister. Depression Brother, Family Members [place], Sister. Hypertension Brother, Family Members [place], Mother, Sister. Prostate Cancer Family Members In General.  Review of Systems Manuel Harmon(Manuel Harmon CMA; 12/31/2013 9:44 AM) General Present- Appetite Loss, Fatigue and Weight Loss. Not Present- Chills, Fever, Night Sweats and Weight Gain. Skin Present- Dryness. Not Present- Change in Wart/Mole, Hives, Jaundice, New Lesions, Non-Healing Wounds, Rash and Ulcer. HEENT Present- Seasonal Allergies. Not Present- Earache, Hearing Loss, Hoarseness, Nose Bleed, Oral Ulcers, Ringing in the Ears, Sinus Pain, Sore Throat, Visual Disturbances, Wears glasses/contact lenses and Yellow Eyes. Cardiovascular Present- Swelling of Extremities. Not Present- Chest Pain, Difficulty Breathing Lying Down, Leg Cramps, Palpitations, Rapid Heart Rate and Shortness of Breath. Gastrointestinal Present- Abdominal Pain, Change in Bowel Habits and Nausea. Not Present- Bloating, Bloody Stool, Chronic diarrhea, Constipation, Difficulty Swallowing, Excessive gas, Gets full quickly at meals, Hemorrhoids, Indigestion, Rectal Pain and Vomiting. Musculoskeletal Not Present- Back Pain, Joint Pain, Joint Stiffness, Muscle Pain, Muscle Weakness and Swelling of Extremities. Neurological Present- Fainting and Tingling. Not Present- Decreased Memory, Headaches, Numbness, Seizures, Tremor, Trouble walking and Weakness. Psychiatric Present- Anxiety, Depression and Frequent crying. Not Present- Bipolar, Change in Sleep Pattern and Fearful. Endocrine Not Present- Cold Intolerance, Excessive Hunger, Hair Changes, Heat Intolerance, Hot flashes and New Diabetes. Hematology Not Present- Easy Bruising, Excessive bleeding, Gland problems, HIV and Persistent Infections.   Vitals (Manuel Harmon CMA; 12/31/2013 9:45 AM) 12/31/2013 9:44 AM Weight: 171 lb Height: 70in Body Surface Area: 1.96 m Body Mass Index:  24.54 kg/m Temp.: 71F(Temporal)  Pulse: 78 (Regular)  BP: 136/80 (Sitting, Left Arm, Standard)    Physical Exam Manuel Harmon(Manuel Harmon M. Manuel Cannady MD; 12/31/2013 9:57 AM) The physical exam findings are as follows: Note:General - appears comfortable, no distress; not diaphorectic  HEENT - normocephalic; sclerae clear, gaze conjugate;  mucous membranes moist, dentition good; voice normal  Neck - symmetric on extension; no palpable anterior or posterior cervical adenopathy; no palpable masses in the thyroid bed  Chest - clear bilaterally with rhonchi, rales, or wheeze  Cor - regular rhythm with normal rate; no significant murmur  Abd - soft without distension; Midline abdominal incision is well healed. With Valsalva there is no sign of incisional hernia. There is palpable permanent suture material at the level of the umbilicus which is tender to palpation. There is palpable suture material along the inferior aspect of the incision which is nontender.  Ext - non-tender without significant edema or lymphedema  Neuro - grossly intact; no tremor    Assessment & Plan Manuel Harmon(Manuel Harmon M. Manuel Greenlaw MD; 12/31/2013 10:00 AM) SUTURE REACTION, INITIAL ENCOUNTER (998.89  T81.89XA) Current Plans  Instructed to keep follow-up appointment as scheduled  The patient complains of pain in the incision from his left colectomy in 2009. Fascia was closed with permanent suture material which is palpable particularly at the level of the umbilicus. This causes discomfort. There is no sign of incisional hernia.  The patient states that he has pain at the umbilicus several times a week. He would like to have surgical intervention in hopes of relieving the discomfort. I have recommended excision of the scar around the level of the umbilicus and removal of the underlying permanent suture material. This can be performed as an outpatient procedure under anesthesia. Patient agrees with this plan and would like to proceed with surgery in the  near future.  The risks and benefits of the procedure have been discussed at length with the patient. The patient understands the proposed procedure, potential alternative treatments, and the course of recovery to be expected. All of the patient's questions have been answered at this time. The patient wishes to proceed with surgery.  Manuel Hecklerodd M. Jamala Kohen, MD, [facility] Office: [phone]

## 2014-01-15 NOTE — Anesthesia Procedure Notes (Signed)
Procedure Name: LMA Insertion Date/Time: 01/15/2014 10:09 AM Performed by: Renella CunasHAZEL, Annleigh Knueppel D Pre-anesthesia Checklist: Patient identified, Emergency Drugs available, Suction available and Patient being monitored Patient Re-evaluated:Patient Re-evaluated prior to inductionOxygen Delivery Method: Circle System Utilized Preoxygenation: Pre-oxygenation with 100% oxygen Intubation Type: IV induction Ventilation: Mask ventilation without difficulty LMA: LMA inserted LMA Size: 5.0 Number of attempts: 1 Airway Equipment and Method: bite block Placement Confirmation: positive ETCO2 Tube secured with: Tape Dental Injury: Teeth and Oropharynx as per pre-operative assessment

## 2014-01-15 NOTE — Anesthesia Preprocedure Evaluation (Signed)
Anesthesia Evaluation  Patient identified by MRN, date of birth, ID band Patient awake    Reviewed: Allergy & Precautions, H&P , NPO status , Patient's Chart, lab work & pertinent test results  Airway Mallampati: II  TM Distance: >3 FB Neck ROM: full    Dental no notable dental hx.    Pulmonary Current Smoker,  breath sounds clear to auscultation  Pulmonary exam normal       Cardiovascular Exercise Tolerance: Good negative cardio ROS  Rhythm:regular Rate:Normal     Neuro/Psych negative neurological ROS  negative psych ROS   GI/Hepatic negative GI ROS, Neg liver ROS,   Endo/Other  negative endocrine ROS  Renal/GU negative Renal ROS  negative genitourinary   Musculoskeletal   Abdominal   Peds  Hematology negative hematology ROS (+)   Anesthesia Other Findings   Reproductive/Obstetrics negative OB ROS                             Anesthesia Physical Anesthesia Plan  ASA: II  Anesthesia Plan: General   Post-op Pain Management:    Induction: Intravenous  Airway Management Planned: LMA  Additional Equipment:   Intra-op Plan:   Post-operative Plan:   Informed Consent: I have reviewed the patients History and Physical, chart, labs and discussed the procedure including the risks, benefits and alternatives for the proposed anesthesia with the patient or authorized representative who has indicated his/her understanding and acceptance.   Dental Advisory Given  Plan Discussed with: CRNA and Surgeon  Anesthesia Plan Comments:         Anesthesia Quick Evaluation

## 2014-01-15 NOTE — Transfer of Care (Signed)
Immediate Anesthesia Transfer of Care Note  Patient: Manuel MediateRalph J Lile  Procedure(s) Performed: Procedure(s) (LRB): SCAR REVISION AND REMOVAL OF SUTURE MATERIAL FROM ABDOMINAL WALL (N/A)  Patient Location: PACU  Anesthesia Type: General  Level of Consciousness: awake, oriented, sedated and patient cooperative  Airway & Oxygen Therapy: Patient Spontanous Breathing and Patient connected to face mask oxygen  Post-op Assessment: Report given to PACU RN and Post -op Vital signs reviewed and stable  Post vital signs: Reviewed and stable  Complications: No apparent anesthesia complications

## 2014-01-16 ENCOUNTER — Encounter (HOSPITAL_BASED_OUTPATIENT_CLINIC_OR_DEPARTMENT_OTHER): Payer: Self-pay | Admitting: Surgery

## 2014-01-16 NOTE — Progress Notes (Signed)
Quick Note:  EKG is acceptable for scheduled surgery.  Juliannah Ohmann M. Duong Haydel, MD, FACS [facility] Office: [phone]   ______ 

## 2014-03-11 ENCOUNTER — Emergency Department: Payer: Self-pay | Admitting: Emergency Medicine

## 2014-03-11 LAB — URINALYSIS, COMPLETE
Bacteria: NONE SEEN
Bilirubin,UR: NEGATIVE
GLUCOSE, UR: NEGATIVE mg/dL (ref 0–75)
Ketone: NEGATIVE
LEUKOCYTE ESTERASE: NEGATIVE
NITRITE: NEGATIVE
Ph: 5 (ref 4.5–8.0)
Protein: NEGATIVE
RBC,UR: 3 /HPF (ref 0–5)
SPECIFIC GRAVITY: 1.023 (ref 1.003–1.030)
Squamous Epithelial: 1

## 2014-03-11 LAB — COMPREHENSIVE METABOLIC PANEL
ALK PHOS: 92 U/L
AST: 22 U/L (ref 15–37)
Albumin: 4.1 g/dL (ref 3.4–5.0)
Anion Gap: 3 — ABNORMAL LOW (ref 7–16)
BUN: 16 mg/dL (ref 7–18)
Bilirubin,Total: 0.5 mg/dL (ref 0.2–1.0)
CO2: 30 mmol/L (ref 21–32)
Calcium, Total: 9.6 mg/dL (ref 8.5–10.1)
Chloride: 106 mmol/L (ref 98–107)
Creatinine: 1.45 mg/dL — ABNORMAL HIGH (ref 0.60–1.30)
EGFR (Non-African Amer.): 57 — ABNORMAL LOW
Glucose: 116 mg/dL — ABNORMAL HIGH (ref 65–99)
Osmolality: 280 (ref 275–301)
Potassium: 4.4 mmol/L (ref 3.5–5.1)
SGPT (ALT): 18 U/L
SODIUM: 139 mmol/L (ref 136–145)
Total Protein: 8 g/dL (ref 6.4–8.2)

## 2014-03-11 LAB — DRUG SCREEN, URINE
AMPHETAMINES, UR SCREEN: NEGATIVE (ref ?–1000)
BARBITURATES, UR SCREEN: NEGATIVE (ref ?–200)
Benzodiazepine, Ur Scrn: NEGATIVE (ref ?–200)
Cannabinoid 50 Ng, Ur ~~LOC~~: POSITIVE (ref ?–50)
Cocaine Metabolite,Ur ~~LOC~~: POSITIVE (ref ?–300)
MDMA (Ecstasy)Ur Screen: NEGATIVE (ref ?–500)
METHADONE, UR SCREEN: NEGATIVE (ref ?–300)
Opiate, Ur Screen: NEGATIVE (ref ?–300)
PHENCYCLIDINE (PCP) UR S: NEGATIVE (ref ?–25)
Tricyclic, Ur Screen: NEGATIVE (ref ?–1000)

## 2014-03-11 LAB — SALICYLATE LEVEL: Salicylates, Serum: 2.8 mg/dL

## 2014-03-11 LAB — ETHANOL

## 2014-03-11 LAB — CBC
HCT: 51.5 % (ref 40.0–52.0)
HGB: 16.6 g/dL (ref 13.0–18.0)
MCH: 29.5 pg (ref 26.0–34.0)
MCHC: 32.2 g/dL (ref 32.0–36.0)
MCV: 92 fL (ref 80–100)
PLATELETS: 338 10*3/uL (ref 150–440)
RBC: 5.63 10*6/uL (ref 4.40–5.90)
RDW: 15.4 % — ABNORMAL HIGH (ref 11.5–14.5)
WBC: 8 10*3/uL (ref 3.8–10.6)

## 2014-03-11 LAB — ACETAMINOPHEN LEVEL: Acetaminophen: <2 ug/mL
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
## 2014-03-13 ENCOUNTER — Inpatient Hospital Stay (HOSPITAL_COMMUNITY)
Admission: AD | Admit: 2014-03-13 | Discharge: 2014-03-17 | DRG: 885 | Disposition: A | Discharge status: Home / Self Care | Payer: Medicaid Other | Source: Intra-hospital | Attending: Psychiatry | Admitting: Psychiatry

## 2014-03-13 ENCOUNTER — Encounter (HOSPITAL_COMMUNITY): Payer: Self-pay | Admitting: *Deleted

## 2014-03-13 DIAGNOSIS — Z599 Problem related to housing and economic circumstances, unspecified: Secondary | ICD-10-CM | POA: Diagnosis not present

## 2014-03-13 DIAGNOSIS — F1290 Cannabis use, unspecified, uncomplicated: Secondary | ICD-10-CM

## 2014-03-13 DIAGNOSIS — F1490 Cocaine use, unspecified, uncomplicated: Secondary | ICD-10-CM

## 2014-03-13 DIAGNOSIS — F1410 Cocaine abuse, uncomplicated: Secondary | ICD-10-CM | POA: Diagnosis present

## 2014-03-13 DIAGNOSIS — F332 Major depressive disorder, recurrent severe without psychotic features: Principal | ICD-10-CM | POA: Diagnosis present

## 2014-03-13 DIAGNOSIS — F17210 Nicotine dependence, cigarettes, uncomplicated: Secondary | ICD-10-CM | POA: Diagnosis present

## 2014-03-13 DIAGNOSIS — F4310 Post-traumatic stress disorder, unspecified: Secondary | ICD-10-CM | POA: Diagnosis present

## 2014-03-13 DIAGNOSIS — F411 Generalized anxiety disorder: Secondary | ICD-10-CM | POA: Diagnosis present

## 2014-03-13 DIAGNOSIS — Z62810 Personal history of physical and sexual abuse in childhood: Secondary | ICD-10-CM | POA: Diagnosis present

## 2014-03-13 DIAGNOSIS — R45851 Suicidal ideations: Secondary | ICD-10-CM | POA: Diagnosis present

## 2014-03-13 DIAGNOSIS — F1220 Cannabis dependence, uncomplicated: Secondary | ICD-10-CM | POA: Diagnosis present

## 2014-03-13 DIAGNOSIS — Z559 Problems related to education and literacy, unspecified: Secondary | ICD-10-CM | POA: Diagnosis present

## 2014-03-13 DIAGNOSIS — F333 Major depressive disorder, recurrent, severe with psychotic symptoms: Secondary | ICD-10-CM

## 2014-03-13 DIAGNOSIS — F419 Anxiety disorder, unspecified: Secondary | ICD-10-CM | POA: Diagnosis present

## 2014-03-13 DIAGNOSIS — F4010 Social phobia, unspecified: Secondary | ICD-10-CM | POA: Diagnosis present

## 2014-03-13 DIAGNOSIS — F329 Major depressive disorder, single episode, unspecified: Secondary | ICD-10-CM | POA: Diagnosis present

## 2014-03-13 DIAGNOSIS — F32A Depression, unspecified: Secondary | ICD-10-CM | POA: Diagnosis present

## 2014-03-13 LAB — BASIC METABOLIC PANEL
Anion gap: 7 (ref 5–15)
BUN: 19 mg/dL (ref 6–23)
CO2: 33 mmol/L — AB (ref 19–32)
CREATININE: 1.54 mg/dL — AB (ref 0.50–1.35)
Calcium: 9.7 mg/dL (ref 8.4–10.5)
Chloride: 105 mEq/L (ref 96–112)
GFR, EST AFRICAN AMERICAN: 63 mL/min — AB (ref >90)
GFR, EST NON AFRICAN AMERICAN: 54 mL/min — AB (ref >90)
Glucose, Bld: 98 mg/dL (ref 70–99)
Potassium: 4.7 mmol/L (ref 3.5–5.1)
Sodium: 145 mmol/L (ref 135–145)

## 2014-03-13 LAB — CBC
HCT: 48.3 % (ref 39.0–52.0)
HEMOGLOBIN: 15.1 g/dL (ref 13.0–17.0)
MCH: 28.7 pg (ref 26.0–34.0)
MCHC: 31.3 g/dL (ref 30.0–36.0)
MCV: 91.7 fL (ref 78.0–100.0)
Platelets: 334 10*3/uL (ref 150–400)
RBC: 5.27 MIL/uL (ref 4.22–5.81)
RDW: 15.1 % (ref 11.5–15.5)
WBC: 5.2 10*3/uL (ref 4.0–10.5)

## 2014-03-13 MED ORDER — ALUM & MAG HYDROXIDE-SIMETH 200-200-20 MG/5ML PO SUSP: 30.0000 mL | ORAL | Status: DC | PRN

## 2014-03-13 MED ORDER — HYDROXYZINE HCL 25 MG PO TABS: 25.0000 mg | ORAL_TABLET | Freq: Every evening | ORAL | Status: DC | PRN

## 2014-03-13 MED ORDER — HYDROXYZINE HCL 25 MG PO TABS: 25.0000 mg | ORAL_TABLET | Freq: Four times a day (QID) | ORAL | Status: AC | PRN

## 2014-03-13 MED ORDER — ONDANSETRON 4 MG PO TBDP: 4.0000 mg | ORAL_TABLET | Freq: Four times a day (QID) | ORAL | Status: AC | PRN

## 2014-03-13 MED ORDER — LORAZEPAM 1 MG PO TABS: 1.0000 mg | ORAL_TABLET | Freq: Four times a day (QID) | ORAL | Status: AC | PRN

## 2014-03-13 MED ORDER — NICOTINE 21 MG/24HR TD PT24
21.0000 mg | MEDICATED_PATCH | Freq: Every day | TRANSDERMAL | Status: DC
  Filled 2014-03-13 (×6): qty 1

## 2014-03-13 MED ORDER — LISINOPRIL 20 MG PO TABS
20.0000 mg | ORAL_TABLET | Freq: Every day | ORAL | Status: DC
  Administered 2014-03-13 – 2014-03-17 (×5): 20 mg via ORAL
  Filled 2014-03-13 (×7): qty 1

## 2014-03-13 MED ORDER — LOPERAMIDE HCL 2 MG PO CAPS: 2.0000 mg | ORAL_CAPSULE | ORAL | Status: AC | PRN

## 2014-03-13 MED ORDER — MAGNESIUM HYDROXIDE 400 MG/5ML PO SUSP: 30.0000 mL | Freq: Every day | ORAL | Status: DC | PRN

## 2014-03-13 MED ORDER — ACETAMINOPHEN 325 MG PO TABS: 650.0000 mg | ORAL_TABLET | Freq: Four times a day (QID) | ORAL | Status: DC | PRN

## 2014-03-13 MED ORDER — DULOXETINE HCL 20 MG PO CPEP
20.0000 mg | ORAL_CAPSULE | Freq: Every day | ORAL | Status: DC
  Administered 2014-03-13 – 2014-03-14 (×2): 20 mg via ORAL
  Filled 2014-03-13 (×5): qty 1

## 2014-03-13 MED ORDER — ADULT MULTIVITAMIN W/MINERALS CH
1.0000 | ORAL_TABLET | Freq: Every day | ORAL | Status: DC
  Administered 2014-03-13 – 2014-03-15 (×3): 1 via ORAL
  Filled 2014-03-13 (×6): qty 1

## 2014-03-13 NOTE — Progress Notes (Signed)
Patient ID: Manuel Harmon, male   DOB: 02-15-1971, 44 y.o.   MRN: [id number] Admit Note. Patient has been admitted to Adult Unit [facility] for voluntary admission . Patient reports he has been increasingly depressed, stating that '' during the holidays I always get down, this is around the time my daughter died, and holidays are hard thinking about her. '' patient reports long hx of depression being treated currently with remeron and klonopin. Upon admission patient reports he has been compliant with medications, and denies drug use. He reports having fleeting passive thoughts of '' what if I could just jump the bridge that day ' but denies any plan to do so stating '' it was just a thought that came in my mind, i don't want to die, I have other children I love that need me. I'm not suicidal and I don't want to hurt myself, I told the doctor that and then they put me here. '' upon admission pt denies any SI/HI/A/ V hallucinations. He is able to contract for safety. Skin searched and noted long scar to mid adominal area where patient reports surgery one month ago for ''diverticulitis'' . Pt oriented to unit. Low fall risk. Notified May NP that patient needs admission orders. Will continue to monitor q 15 minutes for safety.
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
## 2014-03-13 NOTE — [facility] Suicide Risk Assessment (Signed)
   Nursing information obtained from:  Patient Demographic factors:  Male, Low socioeconomic status, Unemployed Current Mental Status:  Self-harm thoughts Loss Factors:  NA Historical Factors:  Anniversary of important loss Risk Reduction Factors:  Responsible for children under 42 years of age Total Time spent with patient: 30 minutes  CLINICAL FACTORS:   Alcohol/Substance Abuse/Dependencies Unstable or Poor Therapeutic Relationship Previous Psychiatric Diagnoses and Treatments  Psychiatric Specialty Exam: Physical Exam  ROS  Blood pressure 133/86, pulse 82, temperature 98.3 F (36.8 C), temperature source Oral, resp. rate 18, height 5' 10.75" (1.797 m), weight 79.379 kg (175 lb).Body mass index is 24.58 kg/(m^2).  General Appearance: Fairly Groomed  Patent attorney::  Good  Speech:  Normal Rate  Volume:  Normal  Mood:  Anxious and Depressed  Affect:  Labile  Thought Process:  Coherent  Orientation:  Full (Time, Place, and Person)  Thought Content:  WDL  Suicidal Thoughts:  No  Homicidal Thoughts:  No  Memory:  Immediate;   Fair Recent;   Fair Remote;   Fair  Judgement:  Impaired  Insight:  Fair  Psychomotor Activity:  Normal  Concentration:  Fair  Recall:  [employer] of Knowledge:Fair  Language: Fair  Akathisia:  No  Handed:  Right  AIMS (if indicated):     Assets:  Communication Skills Desire for Improvement  Sleep:      Musculoskeletal: Strength & Muscle Tone: within normal limits Gait & Station: normal Patient leans: N/A  COGNITIVE FEATURES THAT CONTRIBUTE TO RISK:  Polarized thinking    SUICIDE RISK:   Moderate:  Frequent suicidal ideation with limited intensity, and duration, some specificity in terms of plans, no associated intent, good self-control, limited dysphoria/symptomatology, some risk factors present, and identifiable protective factors, including available and accessible social support.  PLAN OF CARE:Please see H&P.   I certify that inpatient  services furnished can reasonably be expected to improve the patient's condition.  Jailene Cupit MD 03/13/2014, 1:20 PM

## 2014-03-13 NOTE — Tx Team (Signed)
Initial Interdisciplinary Treatment Plan   PATIENT STRESSORS: Financial difficulties Health problems   PATIENT STRENGTHS: Ability for insight Average or above average intelligence Communication skills General fund of knowledge   PROBLEM LIST: Problem List/Patient Goals Date to be addressed Date deferred Reason deferred Estimated date of resolution  Suicide risk 03/13/14   dc  depression 03/13/14   dc                                             DISCHARGE CRITERIA:  Ability to meet basic life and health needs Improved stabilization in mood, thinking, and/or behavior Motivation to continue treatment in a less acute level of care Reduction of life-threatening or endangering symptoms to within safe limits Verbal commitment to aftercare and medication compliance  PRELIMINARY DISCHARGE PLAN: Outpatient therapy Return to previous living arrangement  PATIENT/FAMIILY INVOLVEMENT: This treatment plan has been presented to and reviewed with the patient, Manuel Harmon, and/or family member, .  The patient and family have been given the opportunity to ask questions and make suggestions.  Malva Limes 03/13/2014, 11:19 AM

## 2014-03-14 LAB — LIPID PANEL
CHOL/HDL RATIO: 2.6 ratio
Cholesterol: 137 mg/dL (ref 0–200)
HDL: 52 mg/dL (ref >39)
LDL CALC: 56 mg/dL (ref 0–99)
Triglycerides: 143 mg/dL (ref <150)
VLDL: 29 mg/dL (ref 0–40)

## 2014-03-14 LAB — TSH: TSH: 0.76 u[IU]/mL (ref 0.350–4.500)

## 2014-03-14 MED ORDER — DULOXETINE HCL 30 MG PO CPEP
30.0000 mg | ORAL_CAPSULE | Freq: Every day | ORAL | Status: DC
  Administered 2014-03-15: 30 mg via ORAL
  Filled 2014-03-14 (×2): qty 1

## 2014-03-14 NOTE — Progress Notes (Signed)
Adult Psychoeducational Group Note  Date:  03/14/2014 Time: 09:35  Group Topic/Focus:  Orientation:   The focus of this group is to educate the patient on the purpose and policies of crisis stabilization and provide a format to answer questions about their admission.  The group details unit policies and expectations of patients while admitted.  Participation Level:  Did Not Attend  Participation Quality: n/a  Affect: n/a  Cognitive: n/a  Insight:n/a  Engagement in Group:n/a  Modes of Intervention:  Discussion, Education, Orientation and Support  Additional Comments:  Pt did not attend group. Pt in bed asleep.   Aurora Mask 03/14/2014, 11:07 AM

## 2014-03-14 NOTE — [facility] Group Notes (Signed)
[facility] LCSW Group Therapy 03/14/2014 1:15pm  Type of Therapy and Topic: Group Therapy: Avoiding Self-Sabotaging and Enabling Behaviors   Participation Level: Active  Description of Group:  Learn how to identify obstacles, self-sabotaging and enabling behaviors, what are they, why do we do them and what needs do these behaviors meet? Discuss unhealthy relationships and how to have positive healthy boundaries with those that sabotage and enable. Explore aspects of self-sabotage and enabling in yourself and how to limit these self-destructive behaviors in everyday life. A scaling question is used to help patient look at where they are now in their motivation to change, from 1 to 10 (lowest to highest motivation).   Therapeutic Goals:  1. Patient will identify one obstacle that relates to self-sabotage and enabling behaviors 2. Patient will identify one personal self-sabotaging or enabling behavior they did prior to admission 3. Patient able to establish a plan to change the above identified behavior they did prior to admission:  4. Patient will demonstrate ability to communicate their needs through discussion and/or role plays.  Summary of Patient Progress:  Pt participated actively in group discussion and was able to identify his anger as a self-sabotaging emotion that leads to negative behaviors. Pt demonstrates developing insight as he is able to identify consequences and reasoning for his behaviors. Further, he discussed the improvement of his anger in the past year as he is now able to control his outbursts more regularly. Pt reported that he would like to improve his communication with family and friends as he feels this is beneficial in his ability to control his anger.   Therapeutic Modalities:  Cognitive Behavioral Therapy  Person-Centered Therapy  Motivational Interviewing   Chad Cordial, LCSWA 03/14/2014 12:12 PM

## 2014-03-14 NOTE — Plan of Care (Signed)
Problem: Alteration in mood Goal: LTG-Patient reports reduction in suicidal thoughts (Patient reports reduction in suicidal thoughts and is able to verbalize a safety plan for whenever patient is feeling suicidal)  Outcome: Progressing Pt denied SI this shift.  He verbally contracted for safety.       

## 2014-03-14 NOTE — Progress Notes (Signed)
Adult Psychoeducational Group Note  Date:  03/14/2014 Time:  12:10 AM  Group Topic/Focus:  Wrap-Up Group:   The focus of this group is to help patients review their daily goal of treatment and discuss progress on daily workbooks.  Participation Level:  Active  Participation Quality:  Appropriate and Attentive  Affect:  Appropriate  Cognitive:  Appropriate  Insight: Appropriate  Engagement in Group:  Engaged  Modes of Intervention:  Discussion  Additional Comments:  Pt stated he had a good day. Pt stated he met new people and walked with others today. Pt stated he is here because he made a statement to his doctor about harming himself. Pt stated he wants to work on dealing with the death of his daughter from 2 years ago.  Clint Bolder 03/14/2014, 12:10 AM

## 2014-03-14 NOTE — Progress Notes (Signed)
D   Pt is irritable and forwards little   He covered his face during 1:1 with pt and did not want to talk  He was visible on the milieu but did not interact much  He did request extra blankets as his roommate keeps the room cold   A   Verbal support given   Medications administered and effectiveness monitored   Q 15 min checks  Provided him with blankets and talked to the roommate to agree on a compromise in the room temperature R   Pt safe at present

## 2014-03-14 NOTE — Progress Notes (Addendum)
D: Pt has depressed affect and mood.  Pt reports his goal today was "to open up a little more in group."  Pt reports he accomplished this goal.  Pt reports he may discharge Monday and feels like he is ready for discharge.  Pt reports he will go to an "anger management group.  I'm scheduled to be there on the 7th."  Pt reports this is a tough time of the year for him because "I lost my child 11/22/2011.  I was taking my anger out on everyone else."  Pt reports he is "taking it day by day."  Pt denies SI/HI, denies hallucinations, denies withdrawal symptoms.   A: Met with pt 1:1 and provided support and encouragement.  Safety maintained.  Positive coping skills encouraged.   R: Pt is receptive to staff's support.  He verbally contracts for safety.  Pt reports he will notify staff of needs and concerns.  Will continue to monitor and assess for safety.

## 2014-03-14 NOTE — Progress Notes (Signed)
Patient ID: Manuel Harmon, male   DOB: 05/07/1970, 44 y.o.   MRN: [id number]   Pt currently presents with a blunted affect and anxious/irritable behavior. Pt minimizes his depression and blames others for his current problems. Per self inventory, pt rates depression at a 5, hopelessness 5 and anxiety 6. Pt's daily goal is to " to handle my depression a lot better" and they intend to do so by  "maybe open up more." Pt reports fair sleep, good concentration and a good appetite.  Pt provided with medications per providers orders. Pt's labs and vitals were monitored throughout the day. Pt supported emotionally and encouraged to express concerns and questions. Pt consulted with Clinical research associate. Pt educated on coping skills and medications.  Pt's safety ensured with 15 minute and environmental checks. Pt currently denies SI/HI and A/V hallucinations. Pt verbally agrees to seek staff if SI/HI or A/VH occurs and to consult with staff before acting on these thoughts.

## 2014-03-14 NOTE — Progress Notes (Signed)
Patient ID: Manuel Harmon, male   DOB: 1970-09-09, 44 y.o.   MRN: [id number] Adult Psychoeducational Group Note  Date:  03/14/2014 Time:  1325  Group Topic/Focus:  Coping With Mental Health Crisis:   The purpose of this group is to help patients identify strategies for coping with mental health crisis.  Group discusses possible causes of crisis and ways to manage them effectively.  Participation Level:  Engaged  Participation Quality:  Appropriate   Affect:  Flat  Cognitive:  Alert  Insight: Appropriate  Engagement in Group: Engaged  Modes of Intervention:  Confrontation, Discussion, Education and Support  Additional Comments:  Pt able to identify one positive aspect of self and one coping skill to utilize during times of stress.   Aurora Mask 03/14/2014, 3:38 PM
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
## 2014-03-14 NOTE — Progress Notes (Addendum)
[facility] MD Progress Note  03/14/2014 11:33 AM Manuel Harmon  MRN:  [id number] Subjective: Patient states 'I am Ok ,I feel better.' Objective: Patient seen and chart reviewed. Patient presented after worsening depression after the death of his daughter due to brain tumor in 2013. Patient today continues to have a depressed affect ,reports some improvement on the medications. Reports sleep as Ok and appetite as improving. Patient denies SI/HI/AH/VH. Patient denies any new concerns today.  Patient with multiple psychosocial stressors- like death of daughter ,legal issues with upcoming court date in January.    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 rate 20, height 5' 10.75" (1.797 m), weight 79.379 kg (175 lb).Body mass index is 24.58 kg/(m^2).  General Appearance: Fairly Groomed  Engineer, water::  Fair  Speech:  Clear and Coherent  Volume:  Normal  Mood:  Depressed  Affect:  Restricted  Thought Process:  Coherent  Orientation:  Full (Time, Place, and Person)  Thought Content:  WDL  Suicidal Thoughts:  No  Homicidal Thoughts:  No  Memory:  Immediate;   Fair Recent;   Fair Remote;   Fair  Judgement:  Impaired  Insight:  Shallow  Psychomotor Activity:  Normal  Concentration:  Fair  Recall:  [place]  Language: Fair  Akathisia:  No  Handed:  Right  AIMS (if indicated):     Assets:  Communication Skills Desire for Improvement  Sleep:  Number of Hours: 6.75   Musculoskeletal: Strength & Muscle Tone:  within normal limits Gait & Station: normal Patient leans: N/A  Current Medications: Current Facility-Administered Medications  Medication Dose Route Frequency Provider Last Rate Last Dose  . acetaminophen (TYLENOL) tablet 650 mg  650 mg Oral Q6H PRN Janett Labella, NP      . alum & mag hydroxide-simeth (MAALOX/MYLANTA) 200-200-20 MG/5ML suspension 30 mL  30 mL Oral Q4H PRN Janett Labella, NP      . Derrill Memo ON 03/15/2014] DULoxetine (CYMBALTA) DR capsule 30 mg  30 mg Oral Daily Waynetta Metheny, MD      . hydrOXYzine (ATARAX/VISTARIL) tablet 25 mg  25 mg Oral Q6H PRN Ursula Alert, MD      . lisinopril (PRINIVIL,ZESTRIL) tablet 20 mg  20 mg Oral Daily Sheila May Agustin, NP   20 mg at 03/14/14 0831  . loperamide (IMODIUM) capsule 2-4 mg  2-4 mg Oral PRN Ursula Alert, MD      . LORazepam (ATIVAN) tablet 1 mg  1 mg Oral Q6H PRN Magda Muise, MD      . magnesium hydroxide (MILK OF MAGNESIA) suspension 30 mL  30 mL Oral Daily PRN Freda Munro May Agustin, NP      . multivitamin with minerals tablet 1 tablet  1 tablet Oral Daily Ursula Alert, MD   1 tablet at 03/14/14 0831  . nicotine (NICODERM CQ - dosed in mg/24 hours) patch 21 mg  21 mg Transdermal Daily Janett Labella, NP   Stopped at 03/14/14 [phone]  . ondansetron (ZOFRAN-ODT) disintegrating tablet 4 mg  4 mg Oral Q6H  PRN Ursula Alert, MD        Lab Results:  Results for orders placed or performed during the hospital encounter of 03/13/14 (from the past 48 hour(s))  TSH     Status: None   Collection Time: 03/13/14  7:17 PM  Result Value Ref Range   TSH 0.760 0.350 - 4.500 uIU/mL    Comment: Performed at [facility]  CBC     Status: None   Collection Time: 03/13/14  7:17 PM  Result Value Ref Range   WBC 5.2 4.0 - 10.5 K/uL   RBC 5.27 4.22 - 5.81 MIL/uL   Hemoglobin 15.1 13.0 - 17.0 g/dL   HCT 48.3 39.0 - 52.0 %   MCV 91.7 78.0 - 100.0 fL   MCH 28.7 26.0 - 34.0 pg   MCHC 31.3 30.0 - 36.0 g/dL   RDW 15.1 11.5 - 15.5  %   Platelets 334 150 - 400 K/uL    Comment: Performed at [facility] metabolic panel     Status: Abnormal   Collection Time: 03/13/14  7:17 PM  Result Value Ref Range   Sodium 145 135 - 145 mmol/L    Comment: Please note change in reference range.   Potassium 4.7 3.5 - 5.1 mmol/L    Comment: Please note change in reference range.   Chloride 105 96 - 112 mEq/L   CO2 33 (H) 19 - 32 mmol/L   Glucose, Bld 98 70 - 99 mg/dL   BUN 19 6 - 23 mg/dL   Creatinine, Ser 1.54 (H) 0.50 - 1.35 mg/dL   Calcium 9.7 8.4 - 10.5 mg/dL   GFR calc non Af Amer 54 (L) >90 mL/min   GFR calc Af Amer 63 (L) >90 mL/min    Comment: (NOTE) The eGFR has been calculated using the CKD EPI equation. This calculation has not been validated in all clinical situations. eGFR's persistently <90 mL/min signify possible Chronic Kidney Disease.    Anion gap 7 5 - 15    Comment: Performed at [facility]  Lipid panel     Status: None   Collection Time: 03/13/14  7:17 PM  Result Value Ref Range   Cholesterol 137 0 - 200 mg/dL   Triglycerides 143 <150 mg/dL   HDL 52 >39 mg/dL   Total CHOL/HDL Ratio 2.6 RATIO   VLDL 29 0 - 40 mg/dL   LDL Cholesterol 56 0 - 99 mg/dL    Comment:        Total Cholesterol/HDL:CHD Risk Coronary Heart Disease Risk Table                     Men   Women  1/2 Average Risk   3.4   3.3  Average Risk       5.0   4.4  2 X Average Risk   9.6   7.1  3 X Average Risk  23.4   11.0        Use the calculated Patient Ratio above and the CHD Risk Table to determine the patient's CHD Risk.        ATP III CLASSIFICATION (LDL):  <100     mg/dL   Optimal  100-129  mg/dL   Near or Above                    Optimal  130-159  mg/dL   Borderline  160-189  mg/dL   High  >  190     mg/dL   Very High Performed at [facility]     Physical Findings: AIMS:  , ,  ,  ,    CIWA:  CIWA-Ar Total: 0 COWS:     Treatment Plan Summary: Daily contact with  patient to assess and evaluate symptoms and progress in treatment Medication management  Plan:  Will continue Cymbalta ,will increase the dose to 30 mg po tomorrow. Patient denies any side effects. Will continue Vistaril prn for anxiety symptoms. Patient per initial notes has UDS positive for cocaine as well as cannabis - however patient denies cocaine use and reports he smoked some marijuana a few days ago. Patient will need to be referred for substance abuse care once stable.   CSW will work on disposition. Patient will also need psychotherapy appointment on DC -IPT /Grief.    Medical Decision Making Problem Points:  Established problem, stable/improving (1), Review of last therapy session (1) and Review of psycho-social stressors (1) Data Points:  Review or order clinical lab tests (1) Review of medication regiment & side effects (2) Review of new medications or change in dosage (2)  I certify that inpatient services furnished can reasonably be expected to improve the patient's condition.   Kalah Pflum MD 03/14/2014, 11:33 AM

## 2014-03-15 ENCOUNTER — Encounter (HOSPITAL_COMMUNITY): Payer: Self-pay | Admitting: Registered Nurse

## 2014-03-15 MED ORDER — DULOXETINE HCL 60 MG PO CPEP
60.0000 mg | ORAL_CAPSULE | Freq: Every day | ORAL | Status: DC
  Administered 2014-03-16 – 2014-03-17 (×2): 60 mg via ORAL
  Filled 2014-03-15 (×3): qty 1

## 2014-03-15 MED ORDER — TRAZODONE HCL 50 MG PO TABS
50.0000 mg | ORAL_TABLET | Freq: Every day | ORAL | Status: DC
  Administered 2014-03-15: 50 mg via ORAL
  Filled 2014-03-15 (×3): qty 1

## 2014-03-15 NOTE — Progress Notes (Signed)
D: Pt has depressed affect and mood.  Pt reports his goal today was to "try and get out of here."  Pt reports he may be discharging tomorrow but he isn't sure.  He reports he is going to talk to his social worker about possibly discharging tomorrow.  Pt reported not sleeping well last night because the beds are "too small" and his roommate had a sitter.  Pt reports "I feel like I'm ready to go home."  Pt denies SI/HI, denies hallucinations, denies pain.  He reports he is still depressed and anxious.   A: Medications administered per order.  Safety maintained.  Met with pt 1:1 and provided support and encouragement. R: Pt is compliant with HS medication.  He verbally contracts for safety.  He reported that he will notify writer of needs and concerns.  Will continue to monitor and assess for safety.   

## 2014-03-15 NOTE — Progress Notes (Signed)
Patient ID: Manuel Harmon, male   DOB: 02/11/1971, 44 y.o.   MRN: [id number] [facility] MD Progress Note  03/15/2014 11:20 AM Manuel Harmon  MRN:  [id number]  Subjective: Patient states "I'm feeling better.  I'm happy.  I just want to go home and see my wife and kids. Ya know I was just looking for some help and made statements that I shouldn't have; I am depressed and it just gets worse around the holidays cause of my daughter that died.  I noticed that I just got irritable with everyone and angry; I knowed it won't their fault; so I got into anger management.  I feeling better since I been in here; I'm just having a hard time with my grief"  Objective:   Patient seen and chart reviewed.  Patient states that he is tolerating medications without adverse effects.  Apatite has improved and sleeping without difficulty. Patient continues to endorse depressing rates 5/10 and anxiety 5/10,  Patient denies suicidal/homicidal ideation, auditory/visual/tactile hallucinations, delusional thoughts, and paranoia.      Patient states that he is scheduled for anger management appointment  03/19/14.  Patient states that he will continue outpatient services with [facility] but would like assistance sitting up grief counseling with hospice.    Patient with multiple psychosocial stressors- like death of daughter ,legal issues with upcoming court date in January.    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 He is not agitated. Thought content is not paranoid. He exhibits a depressed mood (Improving, stable). He  expresses no homicidal and no suicidal ideation.    Review of Systems  Neurological: Negative for tremors.  Psychiatric/Behavioral: Positive for depression (Stable) and substance abuse. Negative for suicidal ideas, hallucinations and memory loss. The patient is not nervous/anxious and does not have insomnia.   All other systems reviewed and are negative.   Blood pressure 132/94, pulse 78, temperature 98.8 F (37.1 C), temperature source Oral, resp. rate 16, height 5' 10.75" (1.797 m), weight 79.379 kg (175 lb), SpO2 100 %.Body mass index is 24.58 kg/(m^2).  General Appearance: Fairly Groomed  Engineer, water::  Good  Speech:  Clear and Coherent  Volume:  Normal  Mood:  Depressed  Affect:  Congruent and Depressed  Thought Process:  Coherent  Orientation:  Full (Time, Place, and Person)  Thought Content:  WDL  Suicidal Thoughts:  No  Homicidal Thoughts:  No  Memory:  Immediate;   Good Recent;   Good Remote;   Good  Judgement:  Fair  Insight:  Fair and Present  Psychomotor Activity:  Normal  Concentration:  Fair  Recall:  Good  Fund of Knowledge:Fair  Language: Fair  Akathisia:  No  Handed:  Right  AIMS (if indicated):     Assets:  Communication Skills Desire for Improvement  Sleep:  Number of Hours: 6   Musculoskeletal: Strength & Muscle Tone: within normal limits Gait & Station: normal Patient leans: N/A  Current Medications: Current Facility-Administered Medications  Medication Dose Route Frequency Provider Last Rate Last Dose  . acetaminophen (TYLENOL) tablet 650 mg  650 mg Oral Q6H PRN  Freda Munro May Agustin, NP      . alum & mag hydroxide-simeth (MAALOX/MYLANTA) 200-200-20 MG/5ML suspension 30 mL  30 mL Oral Q4H PRN Janett Labella, NP      . Derrill Memo ON 03/16/2014] DULoxetine (CYMBALTA) DR capsule 60 mg  60 mg Oral Daily Saramma Eappen, MD      . hydrOXYzine (ATARAX/VISTARIL) tablet 25 mg  25 mg Oral Q6H PRN Ursula Alert, MD      . lisinopril (PRINIVIL,ZESTRIL) tablet 20  mg  20 mg Oral Daily Sheila May Agustin, NP   20 mg at 03/15/14 0755  . loperamide (IMODIUM) capsule 2-4 mg  2-4 mg Oral PRN Ursula Alert, MD      . LORazepam (ATIVAN) tablet 1 mg  1 mg Oral Q6H PRN Saramma Eappen, MD      . magnesium hydroxide (MILK OF MAGNESIA) suspension 30 mL  30 mL Oral Daily PRN Freda Munro May Agustin, NP      . multivitamin with minerals tablet 1 tablet  1 tablet Oral Daily Ursula Alert, MD   1 tablet at 03/15/14 0755  . nicotine (NICODERM CQ - dosed in mg/24 hours) patch 21 mg  21 mg Transdermal Daily Janett Labella, NP   Stopped at 03/14/14 [phone]  . ondansetron (ZOFRAN-ODT) disintegrating tablet 4 mg  4 mg Oral Q6H PRN Ursula Alert, MD        Lab Results:  Results for orders placed or performed during the hospital encounter of 03/13/14 (from the past 48 hour(s))  TSH     Status: None   Collection Time: 03/13/14  7:17 PM  Result Value Ref Range   TSH 0.760 0.350 - 4.500 uIU/mL    Comment: Performed at [facility]  CBC     Status: None   Collection Time: 03/13/14  7:17 PM  Result Value Ref Range   WBC 5.2 4.0 - 10.5 K/uL   RBC 5.27 4.22 - 5.81 MIL/uL   Hemoglobin 15.1 13.0 - 17.0 g/dL   HCT 48.3 39.0 - 52.0 %   MCV 91.7 78.0 - 100.0 fL   MCH 28.7 26.0 - 34.0 pg   MCHC 31.3 30.0 - 36.0 g/dL   RDW 15.1 11.5 - 15.5 %   Platelets 334 150 - 400 K/uL    Comment: Performed at [facility] metabolic panel     Status: Abnormal   Collection Time: 03/13/14  7:17 PM  Result Value Ref Range   Sodium 145 135 - 145 mmol/L    Comment: Please note change in reference range.   Potassium 4.7 3.5 - 5.1 mmol/L    Comment: Please note change in reference range.   Chloride 105 96 - 112 mEq/L   CO2 33 (H) 19 - 32 mmol/L   Glucose, Bld 98 70 - 99 mg/dL   BUN 19 6 - 23 mg/dL   Creatinine, Ser 1.54 (H) 0.50 - 1.35 mg/dL   Calcium 9.7 8.4 - 10.5 mg/dL   GFR calc non Af Amer 54 (L) >90 mL/min   GFR calc Af Amer 63 (L) >90 mL/min    Comment:  (NOTE) The eGFR has been calculated using the CKD EPI equation. This calculation has not been validated in all clinical situations. eGFR's persistently <90 mL/min signify possible Chronic Kidney Disease.    Anion gap 7 5 - 15    Comment: Performed at [facility]  Lipid panel     Status: None   Collection Time: 03/13/14  7:17 PM  Result Value Ref Range   Cholesterol 137 0 - 200 mg/dL   Triglycerides 143 <150 mg/dL   HDL 52 >39 mg/dL   Total CHOL/HDL Ratio 2.6 RATIO   VLDL 29 0 - 40 mg/dL   LDL Cholesterol 56 0 - 99 mg/dL    Comment:        Total Cholesterol/HDL:CHD Risk Coronary Heart Disease Risk Table                     Men   Women  1/2 Average Risk   3.4   3.3  Average Risk       5.0   4.4  2 X Average Risk   9.6   7.1  3 X Average Risk  23.4   11.0        Use the calculated Patient Ratio above and the CHD Risk Table to determine the patient's CHD Risk.        ATP III CLASSIFICATION (LDL):  <100     mg/dL   Optimal  100-129  mg/dL   Near or Above                    Optimal  130-159  mg/dL   Borderline  160-189  mg/dL   High  >190     mg/dL   Very High Performed at [facility]     Physical Findings: AIMS:  , ,  ,  ,    CIWA:  CIWA-Ar Total: 0 COWS:     Treatment Plan Summary: Daily contact with patient to assess and evaluate symptoms and progress in treatment Medication management  Plan:  Cymbalta was increased to 60 mg daily.  Patient denies any side effects. Will continue Vistaril prn for anxiety symptoms. Patient will continue to need referral to substance abuse care once stable related to positive UDS for marijuana and Cocaine  CSW will work on disposition. Patient will also need psychotherapy appointment on DC -IPT /Grief.   Possible discharge for by Tuesday 03/17/14  Medical Decision Making Problem Points:  Established problem, stable/improving (1), Review of last therapy session (1) and Review of psycho-social stressors  (1) Data Points:  Review or order clinical lab tests (1) Review of medication regiment & side effects (2) Review of new medications or change in dosage (2)  I certify that inpatient services furnished can reasonably be expected to improve the patient's condition.   Rankin, Shuvon FNP-BC 03/15/2014, 11:20 AM

## 2014-03-15 NOTE — [facility] Group Notes (Signed)
[facility] Group Notes:  (Nursing/MHT/Case Management/Adjunct)  Date:  03/15/2014  Time:  1:52 PM  Type of Therapy:  Psychoeducational Skills  Participation Level:  Active  Participation Quality:  Appropriate  Affect:  Appropriate  Cognitive:  Appropriate  Insight:  Appropriate  Engagement in Group:  Engaged  Modes of Intervention:  Discussion  Summary of Progress/Problems: Pt did attend self inventory group.   Jacquelyne Balint Shanta 03/15/2014, 1:52 PM

## 2014-03-15 NOTE — Progress Notes (Signed)
Adult Psychoeducational Group Note  Date:  03/15/2014 Time:  11:11 PM  Group Topic/Focus:  Wrap-Up Group:   The focus of this group is to help patients review their daily goal of treatment and discuss progress on daily workbooks.  Participation Level:  Active  Participation Quality:  Appropriate and Attentive  Affect:  Appropriate  Cognitive:  Appropriate  Insight: Appropriate  Engagement in Group:  Engaged  Modes of Intervention:  Discussion  Additional Comments:  Pt he had a good day. They were able to smell outside. Pt stated tomorrow is going to work on getting his discharge papers.  Caswell Corwin 03/15/2014, 11:11 PM

## 2014-03-15 NOTE — Plan of Care (Signed)
Problem: Diagnosis: Increased Risk For Suicide Attempt Goal: STG-Patient Will Attend All Groups On The Unit Outcome: Progressing Pt attended evening group on 03/15/13. Goal: STG-Patient Will Comply With Medication Regime Outcome: Progressing Pt was compliant with scheduled HS medication this shift.

## 2014-03-15 NOTE — Progress Notes (Deleted)
[facility] MD Progress Note  03/15/2014 11:26 AM Manuel Harmon  MRN:  [id number] Subjective: Patient states 'I am Ok ,I feel better.' Objective: Patient seen and chart reviewed. Patient presented after worsening depression after the death of his daughter due to brain tumor in 2013. Patient today continues to have a depressed affect ,reports some improvement on the medications. Reports sleep as poor"tossing and turning" ,and depression as improving. Patient had a talk with his mother yesterday ,which seemed to help since they have not spoken in a long time . Patient denies SI/HI/AH/VH. Patient reports wanting to pursue anger management classes on discharge.  Patient with multiple psychosocial stressors- like death of daughter ,legal issues with upcoming court date in January.    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 rate 16, height 5' 10.75" (1.797 m), weight 79.379 kg (175 lb), SpO2 100 %.Body mass index is 24.58 kg/(m^2).  General Appearance: Fairly Groomed  Engineer, water::  Fair  Speech:  Clear and Coherent  Volume:  Normal  Mood:  Depressed improving  Affect:  Restricted but improving  Thought Process:  Coherent  Orientation:  Full (Time, Place, and Person)  Thought Content:  WDL  Suicidal Thoughts:  No  Homicidal Thoughts:  No  Memory:  Immediate;   Fair Recent;   Fair Remote;   Fair  Judgement:  Impaired  Insight:  Shallow  Psychomotor Activity:  Normal  Concentration:  Fair  Recall:  [place]gan: Fair  Akathisia:  No  Handed:  Right  AIMS (if indicated):     Assets:  Communication Skills Desire for Improvement  Sleep:  Number of Hours: 6   Musculoskeletal: Strength & Muscle Tone: within normal limits Gait & Station: normal Patient leans: N/A  Current Medications: Current Facility-Administered Medications  Medication Dose Route Frequency Provider Last Rate Last Dose  . acetaminophen (TYLENOL) tablet 650 mg  650 mg Oral Q6H PRN Janett Labella, NP      . alum & mag hydroxide-simeth (MAALOX/MYLANTA) 200-200-20 MG/5ML suspension 30 mL  30 mL Oral Q4H PRN Janett Labella, NP      . Derrill Memo ON 03/16/2014] DULoxetine (CYMBALTA) DR capsule 60 mg  60 mg Oral Daily Joua Bake, MD      . hydrOXYzine (ATARAX/VISTARIL) tablet 25 mg  25 mg Oral Q6H PRN Ursula Alert, MD      . lisinopril (PRINIVIL,ZESTRIL) tablet 20 mg  20 mg Oral Daily Sheila May Agustin, NP   20 mg at 03/15/14 0755  . loperamide (IMODIUM) capsule 2-4 mg  2-4 mg Oral PRN Ursula Alert, MD      . LORazepam (ATIVAN) tablet 1 mg  1 mg Oral Q6H PRN Raydel Hosick, MD      . magnesium hydroxide (MILK OF MAGNESIA) suspension 30 mL  30 mL Oral Daily PRN Freda Munro May Agustin, NP      . multivitamin with minerals tablet 1 tablet  1 tablet Oral Daily Ursula Alert, MD   1 tablet at 03/15/14 0755  . nicotine (NICODERM CQ - dosed  in mg/24 hours) patch 21 mg  21 mg Transdermal Daily Janett Labella, NP   Stopped at 03/14/14 [phone]  . ondansetron (ZOFRAN-ODT) disintegrating tablet 4 mg  4 mg Oral Q6H PRN Ursula Alert, MD        Lab Results:  Results for orders placed or performed during the hospital encounter of 03/13/14 (from the past 48 hour(s))  TSH     Status: None   Collection Time: 03/13/14  7:17 PM  Result Value Ref Range   TSH 0.760 0.350 - 4.500 uIU/mL    Comment: Performed at [facility]  CBC     Status: None   Collection Time: 03/13/14  7:17 PM  Result Value Ref Range   WBC 5.2 4.0 - 10.5  K/uL   RBC 5.27 4.22 - 5.81 MIL/uL   Hemoglobin 15.1 13.0 - 17.0 g/dL   HCT 48.3 39.0 - 52.0 %   MCV 91.7 78.0 - 100.0 fL   MCH 28.7 26.0 - 34.0 pg   MCHC 31.3 30.0 - 36.0 g/dL   RDW 15.1 11.5 - 15.5 %   Platelets 334 150 - 400 K/uL    Comment: Performed at [facility] metabolic panel     Status: Abnormal   Collection Time: 03/13/14  7:17 PM  Result Value Ref Range   Sodium 145 135 - 145 mmol/L    Comment: Please note change in reference range.   Potassium 4.7 3.5 - 5.1 mmol/L    Comment: Please note change in reference range.   Chloride 105 96 - 112 mEq/L   CO2 33 (H) 19 - 32 mmol/L   Glucose, Bld 98 70 - 99 mg/dL   BUN 19 6 - 23 mg/dL   Creatinine, Ser 1.54 (H) 0.50 - 1.35 mg/dL   Calcium 9.7 8.4 - 10.5 mg/dL   GFR calc non Af Amer 54 (L) >90 mL/min   GFR calc Af Amer 63 (L) >90 mL/min    Comment: (NOTE) The eGFR has been calculated using the CKD EPI equation. This calculation has not been validated in all clinical situations. eGFR's persistently <90 mL/min signify possible Chronic Kidney Disease.    Anion gap 7 5 - 15    Comment: Performed at [facility]  Lipid panel     Status: None   Collection Time: 03/13/14  7:17 PM  Result Value Ref Range   Cholesterol 137 0 - 200 mg/dL   Triglycerides 143 <150 mg/dL   HDL 52 >39 mg/dL   Total CHOL/HDL Ratio 2.6 RATIO   VLDL 29 0 - 40 mg/dL   LDL Cholesterol 56 0 - 99 mg/dL    Comment:        Total Cholesterol/HDL:CHD Risk Coronary Heart Disease Risk Table                     Men   Women  1/2 Average Risk   3.4   3.3  Average Risk       5.0   4.4  2 X Average Risk   9.6   7.1  3 X Average Risk  23.4   11.0        Use the calculated Patient Ratio above and the CHD Risk Table to determine the patient's CHD Risk.        ATP III CLASSIFICATION (LDL):  <100     mg/dL   Optimal  100-129  mg/dL   Near or Above  Optimal  130-159  mg/dL   Borderline  160-189   mg/dL   High  >190     mg/dL   Very High Performed at [facility]     Physical Findings: AIMS:  , ,  ,  ,    CIWA:  CIWA-Ar Total: 0 COWS:     Treatment Plan Summary: Daily contact with patient to assess and evaluate symptoms and progress in treatment Medication management  Plan:  Will continue Cymbalta ,will increase the dose to 50m po daily. Patient denies any side effects. Will add Trazodone 50 mg po qhs for sleep. Will continue Vistaril prn for anxiety symptoms. Patient per initial notes has UDS positive for cocaine as well as cannabis - however patient denies cocaine use and reports he smoked some marijuana a few days ago. Patient will need to be referred for substance abuse care once stable.   CSW will work on disposition. Patient will also need psychotherapy appointment on DC -IPT /Grief/anger management classes.   Medical Decision Making Problem Points:  Established problem, stable/improving (1), Review of last therapy session (1) and Review of psycho-social stressors (1) Data Points:  Review or order clinical lab tests (1) Review of medication regiment & side effects (2) Review of new medications or change in dosage (2)  I certify that inpatient services furnished can reasonably be expected to improve the patient's condition.   Dainelle Hun MD 03/15/2014, 11:26 AM

## 2014-03-15 NOTE — Progress Notes (Signed)
Type of Therapy:  Psychoeducational Skills  Participation Level:  Active  Participation Quality:  Appropriate  Affect:  Appropriate  Cognitive:  Appropriate  Insight:  Appropriate  Engagement in Group:  Engaged  Modes of Intervention:  Discussion  Summary of Progress/Problems: Pt did attend healthy support systems.

## 2014-03-15 NOTE — Progress Notes (Signed)
D: Patient presents with depressed affect and mood. He reported on the self inventory sheet that he's sleeping fair, appetite and ability to pay attention are both improving and energy level is normal. Patient rates depression "5" and feelings of hopelessness "6". He's actively participating in groups, visible in the milieu and interactive with peers in the dayroom. In compliance with current medication regimen.  A: Support and encouragement provided to patient. Scheduled medications given per MD orders. Maintain Q15 minute checks for safety.  R: Patient receptive. Denies SI/HI/AVH. Patient remains safe on the hall.

## 2014-03-15 NOTE — Progress Notes (Signed)
Adult Psychoeducational Group Note  Date:  03/15/2014 Time:  1:00 AM  Group Topic/Focus:  Wrap-Up Group:   The focus of this group is to help patients review their daily goal of treatment and discuss progress on daily workbooks.  Participation Level:  Active  Participation Quality:  Appropriate  Affect:  Appropriate  Cognitive:  Appropriate  Insight: Appropriate  Engagement in Group:  Engaged  Modes of Intervention:  Discussion  Additional Comments:  Pt stated he had a good day today. His goal is to work on getting his discharge papers and staying positive.  [name] C 03/15/2014, 1:00 AM

## 2014-03-15 NOTE — [facility] Group Notes (Signed)
[facility] LCSW Group Therapy  03/15/2014   11:00 AM   Type of Therapy:  Group Therapy  Participation Level:  Active  Participation Quality:  Appropriate and Attentive  Affect:  Appropriate, Bright  Cognitive:  Alert and Appropriate  Insight:  Developing/Improving and Engaged  Engagement in Therapy:  Developing/Improving and Engaged  Modes of Intervention:  Clarification, Confrontation, Discussion, Education, Exploration, Limit-setting, Orientation, Problem-solving, Rapport Building, Dance movement psychotherapist, Socialization and Support  Summary of Progress/Problems: The main focus of today's process group was to identify the patient's current support system and decide on other supports that can be put in place.  An emphasis was placed on using counselor, doctor, therapy groups, 12-step groups, and problem-specific support groups to expand supports, as well as doing something different than has been done before. Pt was an active participant in group discussion.  Pt states that his grandmother, mom, wife and church are his support system.  Pt discussed the need to get back into church as he recognizes that when he is involved in church he does better.  Pt appropriately confronted a peer about asking for help but turning everything down, telling him that life will not always be easy and that it takes work.  Pt states that he plans to stay positive and use better communication, as he reports he used the wrong words to get help, causing him to still be in the hospital now.  Pt is hopeful to d/c tomorrow.     Reyes Ivan, LCSW 03/15/2014 1:44 PM

## 2014-03-16 NOTE — Progress Notes (Signed)
Adult Psychoeducational Group Note  Date:  03/16/2014 Time:  8:49 PM  Group Topic/Focus:  Wrap-Up Group:   The focus of this group is to help patients review their daily goal of treatment and discuss progress on daily workbooks.  Participation Level:  Active  Participation Quality:  Appropriate  Affect:  Appropriate  Cognitive:  Appropriate  Insight: Appropriate  Engagement in Group:  Engaged  Modes of Intervention:  Discussion  Additional Comments:  Pt goal was to be d/c today whoch it wasn't accomplished , but pt will be d/c tomoorw aorund noon per MD.  Bernadene Person H 03/16/2014, 8:49 PM
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
## 2014-03-16 NOTE — Progress Notes (Signed)
Adult Psychoeducational Group Note  Date:  03/16/2014 Time:  1:56 PM  Group Topic/Focus:  Wellness Toolbox:   The focus of this group is to discuss various aspects of wellness, balancing those aspects and exploring ways to increase the ability to experience wellness.  Patients will create a wellness toolbox for use upon discharge.  Participation Level:  Active  Participation Quality:  Appropriate  Affect:  Appropriate  Cognitive:  Appropriate  Insight: Appropriate  Engagement in Group:  Engaged  Modes of Intervention:  Education and Support  Additional Comments:  Pt participated in group. Pt stated that he needs to stay on his medications to maintain his wellness.  Marquis Lunch, Ica Daye 03/16/2014, 1:56 PM

## 2014-03-16 NOTE — Tx Team (Signed)
  Interdisciplinary Treatment Plan Update   Date Reviewed:  03/16/2014  Time Reviewed:  8:46 AM  Progress in Treatment:   Attending groups: Yes Participating in groups: Yes Taking medication as prescribed: Yes  Tolerating medication: Yes Family/Significant other contact made: Yes  Patient understands diagnosis: Yes AEB asking for help with depression and subsequent SI Discussing patient identified problems/goals with staff: Yes  See initial care plan Medical problems stabilized or resolved: Yes Denies suicidal/homicidal ideation: Yes  In tx team Patient has not harmed self or others: Yes  For review of initial/current patient goals, please see plan of care.  Estimated Length of Stay:  2-3 days  Reason for Continuation of Hospitalization: Depression Medication stabilization  New Problems/Goals identified:  N/A  Discharge Plan or Barriers:   return home, follow up outpt  Additional Comments:  Manuel Harmon is an 44 y.o. male, separated, who presents to the emergency department at [facility] stating "because I'm very depressed." Patient is under involuntary commitment. Patient presents with depression and substance abuse of cocaine and canabis who's brought to the emergency department by his aunt and placed under involuntary status after he reported having passive suicidal ideations. Blood alcohol was negative, and urine drug screen positive for cocaine and cannabis. During the psychiatric evaluation, patient was sad, irritable, evasive, somewhat guarded, and generally cooperative. Of note, he flatly denied using any alcohol or drugs despite gentle confrontation of result of drug screen results. He reported feeling "depressed for a long time since 08-08-2002". He cited that the death of his 41 year old daughter from a brain tumor 1 1/2 years ago has been bothering him, especially during the anniversary in holiday events.  Today patient states he is not distressed as he was at admission, and  cites help from his peers, getting back on meds and mending fences with his mother as the reason for feeling better.  Hopes to d/c tomorrow.   Attendees:  Signature: Ivin Booty, MD 03/16/2014 8:46 AM   Signature: Richelle Ito, LCSW 03/16/2014 8:46 AM  Signature: 03/16/2014 8:46 AM  Signature: 03/16/2014 8:46 AM  Signature: Liborio Nixon, RN 03/16/2014 8:46 AM  Signature:  03/16/2014 8:46 AM  Signature:   03/16/2014 8:46 AM  Signature:    Signature:    Signature:    Signature:    Signature:    Signature:      Scribe for Treatment Team:   Richelle Ito, LCSW  03/16/2014 8:46 AM

## 2014-03-16 NOTE — [facility] Group Notes (Signed)
[facility] LCSW Aftercare Discharge Planning Group Note   03/16/2014 10:47 AM  Participation Quality:  Engaged  Mood/Affect:  Appropriate  Depression Rating:  4  Anxiety Rating:  4  Thoughts of Suicide:  No Will you contract for safety?   NA  Current AVH:  No  Plan for Discharge/Comments:  Manuel Harmon admits that he was distressed and having thoughts of SI prior to admission, but states that a therapeutic milieu, medicaton and talking to his mother from whom he was estranged for a year and a half have helped him feel a lot better.  States he is here due to to the holidays and the death of his 23 YO daughter a year and a half ago. Plans to return home to grandmother's and follow up with [facility] and counselor.  Transportation Means: family  Supports: family  [place], Manuel Harmon

## 2014-03-16 NOTE — Progress Notes (Signed)
D: Pt presents with flat affect and depressed mood. Pt reports decreasing depression. Pt rates depression 5/10 from an initial 10/10. Pt denies feeling suicidal/homicidal. Pt reports difficulty sleeping last night due to it been to cold in the room. Pt reported that he sleeps better w/o the trazodone. Pt compliant with taking meds and attending groups.  A: Medications administered as ordered per MD. Verbal support given. Pt encouraged to attend groups. 15 minute checks performed for safety.  R: Pt stated goal is to be discharged today. Pt safety maintained.

## 2014-03-16 NOTE — Progress Notes (Signed)
Patient ID: Manuel Harmon, male   DOB: Sep 08, 1970, 44 y.o.   MRN: [id number] [facility] MD Progress Note  03/16/2014 3:01 PM Manuel Harmon  MRN:  [id number]  Subjective: Patient states  He is feeling better and is hoping for discharge soon. He states " it's just that the holidays are really rough for me since my daughter died". He denies medication side effects, and does feel medications are helping.  Objective:   Patient seen and chart reviewed. At this time, as above, patient is reporting feeling better and is focused on being discharged soon.  Denies any SI, denies any hallucinations, and does not appear internally preoccupied. Tolerating medications ( Cymbalta) well without side effects. States he smokes cannabis occasionally- we reviewed potential negative impact of drug use on mood, and encouraged full abstinence from illegal drugs. No disruptive behaviors on unit. Going to groups, interacting  Appropriately with peers .     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 He is not agitated. Thought content is not paranoid. He exhibits a depressed mood (Improving, stable). He expresses no homicidal and no suicidal ideation.    Review of Systems  Neurological: Negative for tremors.  Psychiatric/Behavioral: Positive for depression (Stable) and substance abuse. Negative for suicidal ideas, hallucinations and memory loss. The patient is not nervous/anxious and does not have insomnia.   All other systems reviewed and are negative.   Blood pressure 145/87, pulse 59, temperature 98.2  F (36.8 C), temperature source Oral, resp. rate 18, height 5' 10.75" (1.797 m), weight 79.379 kg (175 lb), SpO2 100 %.Body mass index is 24.58 kg/(m^2).  General Appearance: improved grooming  Eye Contact::  Good  Speech:  Clear and Coherent  Volume:  Normal  Mood:  states he is feeling better. Affect is reactive, full in range  Affect:  Appropriate  Thought Process:  Coherent  Orientation:  Full (Time, Place, and Person)  Thought Content:  denies hallucinations, no delusions, does not appear internally preoccupied   Suicidal Thoughts:  No  Homicidal Thoughts:  No  Memory:  Recent and remote grossly intact   Judgement:  Fair  Insight:  Fair  Psychomotor Activity:  Normal  Concentration:  Good  Recall:  Good  Fund of Knowledge:Good  Language: Good  Akathisia:  No  Handed:  Right  AIMS (if indicated):     Assets:  Communication Skills Desire for Improvement  Sleep:  Number of Hours: 6.25   Musculoskeletal: Strength & Muscle Tone: within normal limits Gait & Station: normal Patient leans: N/A  Current Medications: Current Facility-Administered Medications  Medication Dose Route Frequency Provider Last Rate Last Dose  . acetaminophen (TYLENOL) tablet 650 mg  650 mg Oral Q6H PRN Lindwood Qua, NP      . alum & mag hydroxide-simeth (MAALOX/MYLANTA) 200-200-20 MG/5ML suspension 30 mL  30 mL Oral Q4H PRN Lindwood Qua, NP      . DULoxetine (CYMBALTA) DR capsule 60 mg  60 mg Oral Daily Jomarie Longs, MD   60 mg at 03/16/14 0815  . lisinopril (PRINIVIL,ZESTRIL) tablet 20 mg  20 mg Oral  Daily Lindwood Qua, NP   20 mg at 03/16/14 2130  . magnesium hydroxide (MILK OF MAGNESIA) suspension 30 mL  30 mL Oral Daily PRN Velna Hatchet May Agustin, NP      . multivitamin with minerals tablet 1 tablet  1 tablet Oral Daily Jomarie Longs, MD   1 tablet at 03/15/14 0755  . nicotine (NICODERM CQ - dosed in mg/24 hours) patch 21 mg  21 mg Transdermal Daily Lindwood Qua, NP   Stopped  at 03/14/14 [phone]  . traZODone (DESYREL) tablet 50 mg  50 mg Oral QHS Jomarie Longs, MD   50 mg at 03/15/14 2211    Lab Results:  No results found for this or any previous visit (from the past 48 hour(s)).  Physical Findings: AIMS:  , ,  ,  ,    CIWA:  CIWA-Ar Total: 0 COWS:      Assessment: At this time patient improved- mood improved, affect fuller in range, no psychotic symptoms. Tolerating medications well. Patient focusing on discharge, and hoping to be discharged tomorrow.  Treatment Plan Summary: Daily contact with patient to assess and evaluate symptoms and progress in treatment Medication management  Plan: Consider discharge soon as he continues to stabilize Cymbalta  60 mg QDAY      Medical Decision Making Problem Points:  Established problem, stable/improving (1), Review of last therapy session (1) and Review of psycho-social stressors (1) Data Points:  Review of medication regiment & side effects (2)  I certify that inpatient services furnished can reasonably be expected to improve the patient's condition.   COBOS, FERNANDO 03/16/2014, 3:01 PM

## 2014-03-16 NOTE — [facility] Suicide Risk Assessment (Signed)
[facility] INPATIENT:  Family/Significant Other Suicide Prevention Education  Suicide Prevention Education:  Education Completed; Manuel Harmon, grandmother, [phone]  has been identified by the patient as the family member/significant other with whom the patient will be residing, and identified as the person(s) who will aid the patient in the event of a mental health crisis (suicidal ideations/suicide attempt).  With written consent from the patient, the family member/significant other has been provided the following suicide prevention education, prior to the and/or following the discharge of the patient.  The suicide prevention education provided includes the following:  Suicide risk factors  Suicide prevention and interventions  National Suicide Hotline telephone number  [facility] assessment telephone number  Manuel Harmon [facility] Emergency Assistance 911  [facility] and/or Residential Mobile Crisis Unit telephone number  Request made of family/significant other to:  Remove weapons (e.g., guns, rifles, knives), all items previously/currently identified as safety concern.    Remove drugs/medications (over-the-counter, prescriptions, illicit drugs), all items previously/currently identified as a safety concern.  The family member/significant other verbalizes understanding of the suicide prevention education information provided.  The family member/significant other agrees to remove the items of safety concern listed above.  Manuel Harmon 03/16/2014, 10:54 AM

## 2014-03-16 NOTE — Progress Notes (Signed)
D Pt. Denies SI and HI, no complaints of pain or discomfort noted.  A Writer offered support and encouragement,  Discussed discharge plans with pt.  R Pt. Remains safe on the unit, rates his depression at a 4 and his anxiety at a 5.  Pt. States he will go to Hospice for grief counseling d/t  Depression over losing his Daughter in 2013 to a brain tumor,  Pt. Also states he attend [facility], ans go to anger management classes. States he is ready for discharge tomorrow.

## 2014-03-17 MED ORDER — ADULT MULTIVITAMIN W/MINERALS CH: 1.0000 | ORAL_TABLET | Freq: Every day | ORAL | Status: DC

## 2014-03-17 MED ORDER — TRAZODONE HCL 50 MG PO TABS: 50.0000 mg | ORAL_TABLET | Freq: Every day | ORAL | Status: DC

## 2014-03-17 MED ORDER — LISINOPRIL 20 MG PO TABS: 20.0000 mg | ORAL_TABLET | Freq: Every morning | ORAL | Status: DC

## 2014-03-17 MED ORDER — DULOXETINE HCL 60 MG PO CPEP: 60.0000 mg | ORAL_CAPSULE | Freq: Every day | ORAL | Status: DC

## 2014-03-17 MED ORDER — VITAMIN D3 25 MCG (1000 UT) PO CAPS: 1.0000 | ORAL_CAPSULE | Freq: Every day | ORAL | Status: DC

## 2014-03-17 NOTE — Progress Notes (Signed)
Discharge note: Pt received both written and verbal discharge instructions. Pt agreed to f/u appt and med regimen. Pt verbalized understanding of discharge instructions. Pt received sample meds, prescriptions and belongings. Pt safely left [facility]. Pt denies SI/HI at time of discharge.

## 2014-03-17 NOTE — [facility] Group Notes (Signed)
[facility] Group Notes:  (Nursing/MHT/Case Management/Adjunct)  Date:  03/17/2014  Time:  9:30am  Type of Therapy:  Nurse Education - Recover Goals  Participation Level:  Active  Participation Quality:  Appropriate  Affect:  Appropriate  Cognitive:  Alert  Insight:  Good  Engagement in Group:  Engaged  Modes of Intervention:  Discussion, Education and Support  Summary of Progress/Problems: Patient was active in group discussion today. States his long term goal is to better deal with his daughter's death and he also hopes to continue smoking cessation that was begun in the hospital.  Lawrence MarseillesFriedman, Ned Kakar Eakes 03/17/2014, 2:50 PM

## 2014-03-17 NOTE — [facility] Suicide Risk Assessment (Signed)
   Demographic Factors:  Male and Unemployed  Total Time spent with patient: 45 minutes  Psychiatric Specialty Exam: Physical Exam  ROS  Blood pressure 143/89, pulse 64, temperature 97.4 F (36.3 C), temperature source Oral, resp. rate 18, height 5' 10.75" (1.797 m), weight 79.379 kg (175 lb), SpO2 100 %.Body mass index is 24.58 kg/(m^2).  General Appearance: Casual  Eye Contact::  Fair  Speech:  Clear and Coherent  Volume:  Normal  Mood:  Euthymic  Affect:  Appropriate  Thought Process:  Coherent  Orientation:  Full (Time, Place, and Person)  Thought Content:  WDL  Suicidal Thoughts:  No  Homicidal Thoughts:  No  Memory:  Immediate;   Fair Recent;   Fair Remote;   Fair  Judgement:  Fair  Insight:  Fair  Psychomotor Activity:  Normal  Concentration:  Fair  Recall:  [employer]  Fund of Knowledge:Fair  Language: Fair  Akathisia:  No  Handed:  Right  AIMS (if indicated):     Assets:  Communication Skills Desire for Improvement  Sleep:  Number of Hours: 5.75    Musculoskeletal: Strength & Muscle Tone: within normal limits Gait & Station: normal Patient leans: N/A   Mental Status Per Nursing Assessment::   On Admission:  Self-harm thoughts  Current Mental Status by Physician: Patient denies SI/HI/AH/VH.  Loss Factors: Decrease in vocational status  Historical Factors: Impulsivity  Risk Reduction Factors:   Positive social support and Positive therapeutic relationship  Continued Clinical Symptoms:  Alcohol/Substance Abuse/Dependencies Previous Psychiatric Diagnoses and Treatments  Cognitive Features That Contribute To Risk:  Polarized thinking    Suicide Risk:  Minimal: No identifiable suicidal ideation.     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 History of diverticulitis of colon   . Diverticulosis of colon   . UTI (lower urinary tract infection)     Plan Of Care/Follow-up recommendations:  Activity:  NO RESTRICTIONS Diet:  REGULAR  Is patient on multiple antipsychotic therapies at discharge:  No   Has Patient had three or more failed trials of antipsychotic monotherapy by history:  No  Recommended Plan for Multiple Antipsychotic Therapies: NA    Manuel Sliger md 03/17/2014, 9:40 AM

## 2014-03-17 NOTE — Discharge Summary (Signed)
Physician Discharge Summary Note  Patient:  Manuel Harmon is an 44 y.o., male MRN:  [id number] DOB:  09/26/1970 Patient phone:  [phone] (home)  Patient address:   [address] [place] [id number],  Total Time spent with patient: 30 minutes  Date of Admission:  03/13/2014 Date of Discharge: 03/17/14  Reason for Admission:  Mood stabilization treatments  Discharge Diagnoses: Principal Problem:   Cannabis use disorder, moderate, dependence Active Problems:   Depression   MDD (major depressive disorder), recurrent severe, without psychosis   Anxiety disorder   PTSD (post-traumatic stress disorder)   Cocaine use disorder, mild, abuse   Psychiatric Specialty Exam: Physical Exam  Review of Systems  Constitutional: Negative.   HENT: Negative.   Eyes: Negative.   Respiratory: Negative.   Cardiovascular: Negative.   Gastrointestinal: Negative.   Genitourinary: Negative.   Musculoskeletal: Negative.   Skin: Negative.   Neurological: Negative.   Endo/Heme/Allergies: Negative.   Psychiatric/Behavioral: Positive for depression (Stabilized with treatment) and substance abuse (Stabilized with treatment). The patient is nervous/anxious (Stabilized with treatment).     Blood pressure 143/89, pulse 64, temperature 97.4 F (36.3 C), temperature source Oral, resp. rate 18, height 5' 10.75" (1.797 m), weight 79.379 kg (175 lb), SpO2 100 %.Body mass index is 24.58 kg/(m^2).  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 History of diverticulitis of colon   . Diverticulosis of colon   . UTI (lower urinary tract infection)     Level of Care:  OP  Hospital Course:  Manuel MediateRalph J Fontanez is an 44 y.o. male, separated, who presents [facility] the emergency department at [facility] reporting severe depression.  Patient is under involuntary commitment. Patient also presented with substance abuse of cocaine and canabis. He was brought to the emergency department by his aunt and placed under involuntary status after he reported having passive suicidal ideations. Blood alcohol was negative, and urine drug screen positive for cocaine and cannabis. During the psychiatric evaluations the patient was sad, irritable, evasive, somewhat guarded, and generally cooperative. He denied using any alcohol or drugs despite gentle confrontation of result of drug screen results. He reported feeling "depressed for a long time since 2004". He cited that the death of his 172 year old daughter from a brain tumor 1 1/2 years ago has been bothering him, especially during the anniversary in holiday events.         Manuel MediateRalph J Kari was admitted to the adult unit. He was evaluated and his symptoms were identified. Medication management was discussed and initiated. Patient was started on Cymbalta at 20 mg daily to target his symptoms of depression and anxiety. The dose was gradually increased to 60 mg daily by time of discharge. He was oriented to the unit and encouraged to participate in unit programming. Medical problems were identified and treated appropriately. Home medication of Lisinopril 20 mg daily for blood pressure control was restarted.         The patient was evaluated each day by a clinical provider  to ascertain the patient's response to treatment.  Improvement was noted by the patient's report of decreasing symptoms, improved sleep and appetite, affect,  medication tolerance, behavior, and participation in unit programming.  He was asked each day to complete a self inventory noting mood, mental status, pain, new symptoms, anxiety and concerns.         He responded well to medication and being in a therapeutic and supportive environment. Positive and appropriate behavior was noted and the patient was motivated for recovery. He talked in group about his depression and his difficulty in coping with the Holidays since his daughter's death. The patient worked closely with the treatment team and case manager to develop a discharge plan with appropriate goals. Coping skills, problem solving as well as relaxation therapies were also part of the unit programming. Patient talked to staff about his cannabis use and was advised to abstain due to potential negative effects on his mental health.          By the day of discharge he was in much improved condition than upon admission.  Symptoms were reported as significantly decreased or resolved completely. The patient denied SI/HI and voiced no AVH. He was motivated to continue taking medication with a goal of continued improvement in mental health.  HEINZ ECKERT was discharged home with a plan to follow up as noted below. Patient was provided with medication samples and prescriptions at time of discharge. He left [facility] in stable condition with all belongings returned to him.   Consults:  None  Significant Diagnostic Studies:  Chemistry panel, Lipid profile, CBC, TSH,   Discharge Vitals:   Blood pressure 143/89, pulse 64, temperature 97.4 F (36.3 C), temperature source Oral, resp. rate 18, height 5' 10.75" (1.797 m), weight 79.379 kg (175 lb), SpO2 100 %. Body mass index is 24.58 kg/(m^2). Lab Results:   No results found for this or any previous visit (from the past 72 hour(s)).  Physical Findings: AIMS: Facial and Oral Movements Muscles of Facial Expression: None, normal Lips and Perioral Area: None, normal Jaw:  None, normal Tongue: None, normal,Extremity Movements Upper (arms, wrists, hands, fingers): None, normal Lower (legs, knees, ankles, toes): None, normal, Trunk Movements Neck, shoulders, hips: None, normal, Overall Severity Severity of abnormal movements (highest score from questions above): None, normal Incapacitation due to abnormal movements: None, normal Patient's awareness of abnormal movements (rate only patient's report): No Awareness, Dental Status Current problems with teeth and/or dentures?: No Does patient usually wear dentures?: No  CIWA:  CIWA-Ar Total: 0 COWS:     Psychiatric Specialty Exam: See Psychiatric Specialty Exam and Suicide Risk Assessment completed by Attending Physician prior to discharge.  Discharge destination:  Home  Is patient on multiple antipsychotic therapies at discharge:  No   Has Patient had three or more failed trials of antipsychotic monotherapy by history:  No  Recommended Plan for Multiple Antipsychotic Therapies: NA  Discharge Instructions    Discharge instructions    Complete by:  As directed   Please follow up with your Primary Care Provider for further management of medical problems such as Hypertension.            Medication List    STOP taking these medications        chlorpheniramine-HYDROcodone 10-8 MG/5ML Lqcr  Commonly known as:  TUSSIONEX     clonazePAM 1 MG tablet  Commonly known as:  KLONOPIN     fluticasone 50 MCG/ACT nasal spray  Commonly known as:  FLONASE     mirtazapine 30 MG tablet  Commonly known as:  REMERON      TAKE these medications      Indication   DULoxetine 60 MG capsule  Commonly known as:  CYMBALTA  Take 1 capsule (60 mg total) by mouth daily.   Indication:  Generalized Anxiety Disorder, Major Depressive Disorder     lisinopril 20 MG tablet  Commonly known as:  PRINIVIL,ZESTRIL  Take 1 tablet (20 mg total) by mouth every morning.   Indication:  High Blood Pressure     multivitamin with  minerals Tabs tablet  Take 1 tablet by mouth daily.   Indication:  Vitamin Supplementation     traZODone 50 MG tablet  Commonly known as:  DESYREL  Take 1 tablet (50 mg total) by mouth at bedtime.   Indication:  Trouble Sleeping     Vitamin D3 1000 UNITS Caps  Take 1 capsule (1,000 Units total) by mouth daily.   Indication:  Vitamin D Deficiency           Follow-up Information    Follow up with [facility].   Why:  Since you did not have another appointment scheduled with the Dr after your last one in October, they asked that you walk in M-F between 8 and 11 AM for your hospital follow up appointment   Contact information:   [address]  [facility] [phone]      Follow-up recommendations:   Activity: NO RESTRICTIONS Diet: REGULAR  Comments:   Take all your medications as prescribed by your mental healthcare provider.  Report any adverse effects and or reactions from your medicines to your outpatient provider promptly.  Patient is instructed and cautioned to not engage in alcohol and or illegal drug use while on prescription medicines.  In the event of worsening symptoms, patient is instructed to call the crisis hotline, 911 and or go to the nearest ED for appropriate evaluation and treatment of symptoms.  Follow-up with your primary care provider for your other medical issues, concerns and or health care needs.   Total Discharge Time:  Greater than 30 minutes.  SignedFransisca Kaufmann NP-C 03/17/2014, 10:34 AM

## 2014-03-18 NOTE — Progress Notes (Signed)
Patient Discharge Instructions:  After Visit Summary (AVS):   Faxed to:  03/18/14 Discharge Summary Note:   Faxed to:  03/18/14 Psychiatric Admission Assessment Note:   Faxed to:  03/18/14 Suicide Risk Assessment - Discharge Assessment:   Faxed to:  03/18/14 Faxed/Sent to the Next Level Care provider:  03/18/14 Faxed to [facility] @ [phone]  Jerelene ReddenSheena E [place], 03/18/2014, 4:00 PM

## 2014-03-19 NOTE — Progress Notes (Signed)
[facility] :  Will you be returning to the same living situation after discharge: Yes,  home At discharge, do you have transportation home?:Yes,  family Do you have the ability to pay for your medications:Yes,  MCD  Release of information consent forms completed and in the chart;  Patient's signature needed at discharge.  Patient to Follow up at: Follow-up Information    Follow up with [facility].   Why:  Since you did not have another appointment scheduled with the Dr after your last one in October, they asked that you walk in M-F between 8 and 11 AM for your hospital follow up appointment   Contact information:   [address]  [facility] [336] [phone]      Patient denies SI/HI:   Yes,  yes    Safety Planning and Suicide Prevention discussed:  Yes,  yes  N/A patient is not a smoker  Daryel Geraldorth, [place] B 03/19/2014, 8:47 AM

## 2014-06-30 ENCOUNTER — Emergency Department
Admit: 2014-06-30 | Disposition: A | Discharge status: Home / Self Care | Payer: Self-pay | Admitting: Emergency Medicine

## 2014-07-11 ENCOUNTER — Emergency Department: Admit: 2014-07-11 | Discharge status: Against Medical Advice | Payer: Self-pay | Admitting: Emergency Medicine

## 2015-03-22 ENCOUNTER — Ambulatory Visit: Payer: Self-pay | Admitting: Obstetrics and Gynecology

## 2015-03-23 ENCOUNTER — Encounter: Payer: Self-pay | Admitting: *Deleted

## 2015-03-23 DIAGNOSIS — I10 Essential (primary) hypertension: Secondary | ICD-10-CM | POA: Insufficient documentation

## 2015-03-23 DIAGNOSIS — Z720 Tobacco use: Secondary | ICD-10-CM | POA: Insufficient documentation

## 2015-03-25 ENCOUNTER — Encounter: Payer: Self-pay | Admitting: Obstetrics and Gynecology

## 2015-03-25 ENCOUNTER — Ambulatory Visit (INDEPENDENT_AMBULATORY_CARE_PROVIDER_SITE_OTHER): Payer: Medicaid Other | Admitting: Obstetrics and Gynecology

## 2015-03-25 VITALS — BP 127/83 | HR 98 | Ht 70.0 in | Wt 174.8 lb

## 2015-03-25 DIAGNOSIS — R310 Gross hematuria: Secondary | ICD-10-CM

## 2015-03-25 LAB — MICROSCOPIC EXAMINATION

## 2015-03-25 LAB — URINALYSIS, COMPLETE
BILIRUBIN UA: NEGATIVE
GLUCOSE, UA: NEGATIVE
KETONES UA: NEGATIVE
Leukocytes, UA: NEGATIVE
Nitrite, UA: NEGATIVE
Urobilinogen, Ur: 0.2 mg/dL (ref 0.2–1.0)
pH, UA: 5.5 (ref 5.0–7.5)

## 2015-03-25 NOTE — Progress Notes (Signed)
03/25/2015 3:34 PM   Manuel Harmon 06/07/1970 [id number]  Referring provider: No referring provider defined for this encounter.  Chief Complaint  Patient presents with  . Hematuria    New Patient    HPI:   Patient is a 45 year old male presenting today as a referral from his primary care provider with complaints of gross hematuria.  He states that his PCP prescribed him an antibiotic after he noted blood in his urine and gross hematuria resolved but when he returned for a recheck blood was noted on UA and urine culture was negative. He does reports occasional intermittent lower back pain left greater than right. He denies fevers, dysuria, frequency, urgency or sensation of incomplete bladder emptying.  Recent STI screening negative.  No new sexual partners. No penile pain or discharge.  No history of kidney stones.  Current smoker 1 ppd x 15 years.  No previous episodes of gross hematuria.   2 UAs positive in 2015 for microscopic hematuria.  Grandfather had prostate cancer.    PMH: Past Medical History  Diagnosis Date  . History of diverticulitis of colon   . Diverticulosis of colon   . UTI (lower urinary tract infection)   . Anxiety and depression     Surgical History: Past Surgical History  Procedure Laterality Date  . Left colectomy and appendectomy  05-31-2007  . Right orchiopexy inguinal approach and hydrocelectomy  1989  . Scar revision N/A 01/15/2014    Procedure: SCAR REVISION AND REMOVAL OF SUTURE MATERIAL FROM ABDOMINAL WALL;  Surgeon: Darnell Level, MD;  Location: [facility]  Service: General;  Laterality: N/A;    Home Medications:    Medication List       This list is accurate as of: 03/25/15  3:34 PM.  Always use your most recent med list.               clonazePAM 1 MG tablet  Commonly known as:  KLONOPIN  Take 1 mg by mouth 2 (two) times daily.     lisinopril 20 MG tablet  Commonly known as:  PRINIVIL,ZESTRIL  Take 1 tablet  (20 mg total) by mouth every morning.     mirabegron ER 25 MG Tb24 tablet  Commonly known as:  MYRBETRIQ  Take 25 mg by mouth daily.     multivitamin with minerals Tabs tablet  Take 1 tablet by mouth daily.     traZODone 50 MG tablet  Commonly known as:  DESYREL  Take 1 tablet (50 mg total) by mouth at bedtime.     Vitamin D3 1000 units Caps  Take 1 capsule (1,000 Units total) by mouth daily.        Allergies:  Allergies  Allergen Reactions  . Wellbutrin [Bupropion] Hives and Other (See Comments)  . Ibuprofen Nausea And Vomiting, Other (See Comments) and Rash    GI Issues "UPSET STOMACH LINING"    Family History: Family History  Problem Relation Age of Onset  . Drug abuse Father   . Bladder Cancer Neg Hx   . Prostate cancer Paternal Grandfather   . Kidney cancer Neg Hx     Social History:  reports that he has been smoking Cigarettes.  He has a 33 pack-year smoking history. He has never used smokeless tobacco. He reports that he drinks about 1.2 oz of alcohol per week. He reports that he does not use illicit drugs.  ROS: UROLOGY Frequent Urination?: No Hard to postpone urination?: No Burning/pain with urination?: No  Get up at night to urinate?: No Leakage of urine?: Yes Urine stream starts and stops?: No Trouble starting stream?: No Do you have to strain to urinate?: No Blood in urine?: Yes Urinary tract infection?: No Sexually transmitted disease?: No Injury to kidneys or bladder?: No Painful intercourse?: No Weak stream?: No Erection problems?: No Penile pain?: No  Gastrointestinal Nausea?: No Vomiting?: No Indigestion/heartburn?: No Diarrhea?: No Constipation?: No  Constitutional Fever: No Night sweats?: No Weight loss?: No Fatigue?: No  Skin Skin rash/lesions?: Yes Itching?: Yes  Eyes Blurred vision?: No Double vision?: No  Ears/Nose/Throat Sore throat?: No Sinus problems?: No  Hematologic/Lymphatic Swollen glands?: No Easy  bruising?: No  Cardiovascular Leg swelling?: No Chest pain?: No  Respiratory Cough?: No Shortness of breath?: No  Endocrine Excessive thirst?: No  Musculoskeletal Back pain?: Yes Joint pain?: No  Neurological Headaches?: No Dizziness?: No  Psychologic Depression?: Yes Anxiety?: Yes  Physical Exam: BP 127/83 mmHg  Pulse 98  Ht 5\' 10"  (1.778 m)  Wt 174 lb 12.8 oz (79.289 kg)  BMI 25.08 kg/m2  Constitutional:  Alert and oriented, No acute distress. HEENT: [name], moist mucus membranes.  Trachea midline, no masses. Cardiovascular: No clubbing, cyanosis, or edema. Respiratory: Normal respiratory effort, no increased work of breathing. GI: Abdomen is soft, nontender, nondistended, no abdominal masses GU: No CVA tenderness. Genital Exam: declined  DRE: declined Skin: No rashes, bruises or suspicious lesions. Lymph: No cervical or inguinal adenopathy. Neurologic: Grossly intact, no focal deficits, moving all 4 extremities. Psychiatric: Normal mood and affect.  Laboratory Data:   Urinalysis    Component Value Date/Time   COLORURINE Yellow 03/11/2014 1544   COLORURINE YELLOW 12/28/2007 2109   APPEARANCEUR Clear 03/11/2014 1544   APPEARANCEUR CLEAR 12/28/2007 2109   LABSPEC 1.023 03/11/2014 1544   LABSPEC 1.027 12/28/2007 2109   PHURINE 5.0 03/11/2014 1544   PHURINE 6.0 12/28/2007 2109   GLUCOSEU Negative 03/11/2014 1544   GLUCOSEU NEGATIVE 12/28/2007 2109   HGBUR 1+ 03/11/2014 1544   HGBUR SMALL* 12/28/2007 2109   BILIRUBINUR Negative 03/11/2014 1544   BILIRUBINUR NEGATIVE 12/28/2007 2109   KETONESUR Negative 03/11/2014 1544   KETONESUR NEGATIVE 12/28/2007 2109   PROTEINUR Negative 03/11/2014 1544   PROTEINUR NEGATIVE 12/28/2007 2109   UROBILINOGEN 0.2 12/28/2007 2109   NITRITE Negative 03/11/2014 1544   NITRITE NEGATIVE 12/28/2007 2109   LEUKOCYTESUR Negative 03/11/2014 1544   LEUKOCYTESUR SMALL* 12/28/2007 2109    Pertinent Imaging:   Assessment &  Plan:   1. Gross hematuria- We discussed the differential diagnosis for hematuria including nephrolithiasis, renal or upper tract tumors, bladder stones, UTIs, or bladder tumors as well as undetermined etiologies. Per AUA guidelines, I did recommend complete microscopic hematuria evaluation including CTU, possible urine cytology, and office cystoscopy. - Urinalysis, Complete  2. Prostate cancer screening- Declined DRE.  PSA drawn today.  Return for CT Urogram results and cystoscopy.  These notes generated with voice recognition software. I apologize for typographical errors.  Earlie LouLindsay Theressa Piedra, FNP  [facility] [address], [address] [place], [place] [address] [phone]

## 2015-03-25 NOTE — Patient Instructions (Signed)
Cystoscopy Cystoscopy is a procedure that is used to help your caregiver diagnose and sometimes treat conditions that affect your lower urinary tract. Your lower urinary tract includes your bladder and the tube through which urine passes from your bladder out of your body (urethra). Cystoscopy is performed with a thin, tube-shaped instrument (cystoscope). The cystoscope has lenses and a light at the end so that your caregiver can see inside your bladder. The cystoscope is inserted at the entrance of your urethra. Your caregiver guides it through your urethra and into your bladder. There are two main types of cystoscopy:  Flexible cystoscopy (with a flexible cystoscope).  Rigid cystoscopy (with a rigid cystoscope). Cystoscopy may be recommended for many conditions, including:  Urinary tract infections.  Blood in your urine (hematuria).  Loss of bladder control (urinary incontinence) or overactive bladder.  Unusual cells found in a urine sample.  Urinary blockage.  Painful urination. Cystoscopy may also be done to remove a sample of your tissue to be checked under a microscope (biopsy). It may also be done to remove or destroy bladder stones. LET YOUR CAREGIVER KNOW ABOUT:  Allergies to food or medicine.  Medicines taken, including vitamins, herbs, eyedrops, over-the-counter medicines, and creams.  Use of steroids (by mouth or creams).  Previous problems with anesthetics or numbing medicines.  History of bleeding problems or blood clots.  Previous surgery.  Other health problems, including diabetes and kidney problems.  Possibility of pregnancy, if this applies. PROCEDURE The area around the opening to your urethra will be cleaned. A medicine to numb your urethra (local anesthetic) is used. If a tissue sample or stone is removed during the procedure, you may be given a medicine to make you sleep (general anesthetic). Your caregiver will gently insert the tip of the cystoscope  into your urethra. The cystoscope will be slowly glided through your urethra and into your bladder. Sterile fluid will flow through the cystoscope and into your bladder. The fluid will expand and stretch your bladder. This gives your caregiver a better view of your bladder walls. The procedure lasts about 15-20 minutes. AFTER THE PROCEDURE If a local anesthetic is used, you will be allowed to go home as soon as you are ready. If a general anesthetic is used, you will be taken to a recovery area until you are stable. You may have temporary bleeding and burning on urination.   This information is not intended to replace advice given to you by your health care provider. Make sure you discuss any questions you have with your health care provider.   Document Released: 02/25/2000 Document Revised: 03/20/2014 Document Reviewed: 08/21/2011 Elsevier Interactive Patient Education 2016 Elsevier Inc. Hematuria, Adult Hematuria is blood in your urine. It can be caused by a bladder infection, kidney infection, prostate infection, kidney stone, or cancer of your urinary tract. Infections can usually be treated with medicine, and a kidney stone usually will pass through your urine. If neither of these is the cause of your hematuria, further workup to find out the reason may be needed. It is very important that you tell your health care provider about any blood you see in your urine, even if the blood stops without treatment or happens without causing pain. Blood in your urine that happens and then stops and then happens again can be a symptom of a very serious condition. Also, pain is not a symptom in the initial stages of many urinary cancers. HOME CARE INSTRUCTIONS   Drink lots of fluid, 3-4   quarts a day. If you have been diagnosed with an infection, cranberry juice is especially recommended, in addition to large amounts of water.  Avoid caffeine, tea, and carbonated beverages because they tend to irritate the  bladder.  Avoid alcohol because it may irritate the prostate.  Take all medicines as directed by your health care provider.  If you were prescribed an antibiotic medicine, finish it all even if you start to feel better.  If you have been diagnosed with a kidney stone, follow your health care provider's instructions regarding straining your urine to catch the stone.  Empty your bladder often. Avoid holding urine for long periods of time.  After a bowel movement, women should cleanse front to back. Use each tissue only once.  Empty your bladder before and after sexual intercourse if you are a male. SEEK MEDICAL CARE IF:  You develop back pain.  You have a fever.  You have a feeling of sickness in your stomach (nausea) or vomiting.  Your symptoms are not better in 3 days. Return sooner if you are getting worse. SEEK IMMEDIATE MEDICAL CARE IF:   You develop severe vomiting and are unable to keep the medicine down.  You develop severe back or abdominal pain despite taking your medicines.  You begin passing a large amount of blood or clots in your urine.  You feel extremely weak or faint, or you pass out. MAKE SURE YOU:   Understand these instructions.  Will watch your condition.  Will get help right away if you are not doing well or get worse.   This information is not intended to replace advice given to you by your health care provider. Make sure you discuss any questions you have with your health care provider.   Document Released: 02/27/2005 Document Revised: 03/20/2014 Document Reviewed: 10/28/2012 Elsevier Interactive Patient Education 2016 Elsevier Inc.  

## 2015-03-26 LAB — BASIC METABOLIC PANEL
BUN / CREAT RATIO: 12 (ref 9–20)
BUN: 17 mg/dL (ref 6–24)
CHLORIDE: 104 mmol/L (ref 96–106)
CO2: 24 mmol/L (ref 18–29)
Calcium: 9.1 mg/dL (ref 8.7–10.2)
Creatinine, Ser: 1.44 mg/dL — ABNORMAL HIGH (ref 0.76–1.27)
GFR calc Af Amer: 69 mL/min/{1.73_m2} (ref >59)
GFR calc non Af Amer: 59 mL/min/{1.73_m2} — ABNORMAL LOW (ref >59)
GLUCOSE: 108 mg/dL — AB (ref 65–99)
Potassium: 4.6 mmol/L (ref 3.5–5.2)
SODIUM: 141 mmol/L (ref 134–144)

## 2015-03-26 LAB — PSA TOTAL (REFLEX TO FREE): Prostate Specific Ag, Serum: 1.5 ng/mL (ref 0.0–4.0)

## 2015-04-05 ENCOUNTER — Ambulatory Visit
Admission: RE | Admit: 2015-04-05 | Discharge: 2015-04-05 | Disposition: A | Discharge status: Home / Self Care | Payer: Medicaid Other | Source: Ambulatory Visit | Attending: Obstetrics and Gynecology | Admitting: Obstetrics and Gynecology

## 2015-04-05 DIAGNOSIS — N200 Calculus of kidney: Secondary | ICD-10-CM | POA: Insufficient documentation

## 2015-04-05 DIAGNOSIS — Z9049 Acquired absence of other specified parts of digestive tract: Secondary | ICD-10-CM | POA: Insufficient documentation

## 2015-04-05 DIAGNOSIS — N289 Disorder of kidney and ureter, unspecified: Secondary | ICD-10-CM | POA: Diagnosis not present

## 2015-04-05 DIAGNOSIS — R3129 Other microscopic hematuria: Secondary | ICD-10-CM | POA: Insufficient documentation

## 2015-04-05 DIAGNOSIS — R310 Gross hematuria: Secondary | ICD-10-CM | POA: Diagnosis present

## 2015-04-05 MED ORDER — IOHEXOL 350 MG/ML SOLN
150.0000 mL | Freq: Once | INTRAVENOUS | Status: AC | PRN
  Administered 2015-04-05: 150 mL via INTRAVENOUS

## 2015-04-12 ENCOUNTER — Ambulatory Visit (INDEPENDENT_AMBULATORY_CARE_PROVIDER_SITE_OTHER): Payer: Medicaid Other | Admitting: Urology

## 2015-04-12 VITALS — BP 150/87 | HR 77 | Wt 178.3 lb

## 2015-04-12 DIAGNOSIS — R310 Gross hematuria: Secondary | ICD-10-CM | POA: Diagnosis not present

## 2015-04-12 LAB — URINALYSIS, COMPLETE
Bilirubin, UA: NEGATIVE
GLUCOSE, UA: NEGATIVE
Ketones, UA: NEGATIVE
LEUKOCYTES UA: NEGATIVE
Nitrite, UA: NEGATIVE
Urobilinogen, Ur: 0.2 mg/dL (ref 0.2–1.0)
pH, UA: 5 (ref 5.0–7.5)

## 2015-04-12 LAB — MICROSCOPIC EXAMINATION

## 2015-04-12 MED ORDER — CIPROFLOXACIN HCL 500 MG PO TABS
500.0000 mg | ORAL_TABLET | Freq: Once | ORAL | Status: AC
  Administered 2015-04-12: 500 mg via ORAL

## 2015-04-12 MED ORDER — LIDOCAINE HCL 2 % EX GEL
1.0000 "application " | Freq: Once | CUTANEOUS | Status: AC
  Administered 2015-04-12: 1 via URETHRAL

## 2015-04-12 NOTE — Progress Notes (Signed)
    Cystoscopy Procedure Note  Patient identification was confirmed, informed consent was obtained, and patient was prepped using Betadine solution.  Lidocaine jelly was administered per urethral meatus.    Preoperative abx where received prior to procedure.     Pre-Procedure: - Inspection reveals a normal caliber ureteral meatus.  Procedure: The flexible cystoscope was introduced without difficulty - No urethral strictures/lesions are present. - Normal prostate - Normal bladder neck - Bilateral ureteral orifices identified - Bladder mucosa  reveals no ulcers, tumors, or lesions - No bladder stones - No trabeculation  Retroflexion shows  Normal bladder neck, no prostatic protrusion   Post-Procedure: - Patient tolerated the procedure well  Discussion: The patient's hematuria evaluation was essentially normal. There is no detectable cause of his gross hematuria that was of clinical significance. I went over the patient's CT scan with him as well. He does have a hyperdense cyst in the lower pole of the left kidney, this should be monitored with an ultrasound in 1 year. If at that point it is normal, it should not require any additional follow-up.

## 2015-04-19 ENCOUNTER — Emergency Department: Payer: Medicaid Other

## 2015-04-19 ENCOUNTER — Emergency Department
Admission: EM | Admit: 2015-04-19 | Discharge: 2015-04-19 | Disposition: A | Discharge status: Home / Self Care | Payer: Medicaid Other | Attending: Emergency Medicine | Admitting: Emergency Medicine

## 2015-04-19 ENCOUNTER — Encounter: Payer: Self-pay | Admitting: *Deleted

## 2015-04-19 DIAGNOSIS — Z79899 Other long term (current) drug therapy: Secondary | ICD-10-CM | POA: Insufficient documentation

## 2015-04-19 DIAGNOSIS — F17210 Nicotine dependence, cigarettes, uncomplicated: Secondary | ICD-10-CM | POA: Diagnosis not present

## 2015-04-19 DIAGNOSIS — W2209XA Striking against other stationary object, initial encounter: Secondary | ICD-10-CM | POA: Insufficient documentation

## 2015-04-19 DIAGNOSIS — Y9389 Activity, other specified: Secondary | ICD-10-CM | POA: Insufficient documentation

## 2015-04-19 DIAGNOSIS — Y998 Other external cause status: Secondary | ICD-10-CM | POA: Insufficient documentation

## 2015-04-19 DIAGNOSIS — S62636A Displaced fracture of distal phalanx of right little finger, initial encounter for closed fracture: Secondary | ICD-10-CM | POA: Diagnosis not present

## 2015-04-19 DIAGNOSIS — Y9289 Other specified places as the place of occurrence of the external cause: Secondary | ICD-10-CM | POA: Insufficient documentation

## 2015-04-19 DIAGNOSIS — S6991XA Unspecified injury of right wrist, hand and finger(s), initial encounter: Secondary | ICD-10-CM | POA: Diagnosis present

## 2015-04-19 DIAGNOSIS — S62609A Fracture of unspecified phalanx of unspecified finger, initial encounter for closed fracture: Secondary | ICD-10-CM

## 2015-04-19 MED ORDER — HYDROCODONE-ACETAMINOPHEN 5-325 MG PO TABS
1.0000 | ORAL_TABLET | Freq: Once | ORAL | Status: AC
  Administered 2015-04-19: 1 via ORAL
  Filled 2015-04-19: qty 1

## 2015-04-19 MED ORDER — HYDROCODONE-ACETAMINOPHEN 5-325 MG PO TABS: 1.0000 | ORAL_TABLET | Freq: Four times a day (QID) | ORAL | Status: DC | PRN

## 2015-04-19 NOTE — ED Provider Notes (Signed)
[facility] Emergency Department Provider Note  ____________________________________________  Time seen: Approximately 6:16 AM  I have reviewed the triage vital signs and the nursing notes.   HISTORY  Chief Complaint Finger Injury    HPI Manuel Harmon is a 45 y.o. male who presents to the ED from home with a chief complaint of right finger pain and swelling. Patient is right-hand dominant and states he struck his right pinky on something while getting out of bed. Denies striking head or LOC. Complains of pain and swelling to the distal aspect of right pinky finger. Denies recent travel, fever, chest pain, shortness of breath, abdominal pain, nausea, vomiting, diarrhea. Nothing makes the pain better. Movement makes pain worse.   Past Medical History  Diagnosis Date  . History of diverticulitis of colon   . Diverticulosis of colon   . UTI (lower urinary tract infection)   . Anxiety and depression     Patient Active Problem List   Diagnosis Date Noted  . BP (high blood pressure) 03/23/2015  . Current tobacco use 03/23/2015  . Depression 03/13/2014  . 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 Anxiety disorder 03/13/2014  . PTSD (post-traumatic stress disorder) 03/13/2014  . Cocaine use disorder, mild, abuse 03/13/2014  . Cannabis use disorder, moderate, dependence (HCC) 03/13/2014  . Suture reaction 01/15/2014    Past Surgical History  Procedure Laterality Date  . Left colectomy and appendectomy  05-31-2007  . Right orchiopexy inguinal approach and hydrocelectomy  1989  . Scar revision N/A 01/15/2014    Procedure: SCAR REVISION AND REMOVAL OF SUTURE MATERIAL FROM ABDOMINAL WALL;  Surgeon: Darnell Level, MD;  Location: [facility];  Service: General;  Laterality: N/A;    Current Outpatient Rx  Name  Route  Sig  Dispense  Refill  . clonazePAM (KLONOPIN) 1 MG tablet   Oral   Take 1 mg by  mouth 2 (two) times daily.         Marland Kitchen lisinopril (PRINIVIL,ZESTRIL) 20 MG tablet   Oral   Take 1 tablet (20 mg total) by mouth every morning.         . Multiple Vitamin (MULTIVITAMIN WITH MINERALS) TABS tablet   Oral   Take 1 tablet by mouth daily.           Allergies Wellbutrin and Ibuprofen  Family History  Problem Relation Age of Onset  . Drug abuse Father   . Bladder Cancer Neg Hx   . Prostate cancer Paternal Grandfather   . Kidney cancer Neg Hx     Social History Social History  Substance Use Topics  . Smoking status: Current Every Day Smoker -- 1.00 packs/day for 33 years    Types: Cigarettes  . Smokeless tobacco: Never Used  . Alcohol Use: 1.2 oz/week    2 Standard drinks or equivalent per week    Review of Systems Constitutional: No fever/chills Eyes: No visual changes. ENT: No sore throat. Cardiovascular: Denies chest pain. Respiratory: Denies shortness of breath. Gastrointestinal: No abdominal pain.  No nausea, no vomiting.  No diarrhea.  No constipation. Genitourinary: Negative for dysuria. Musculoskeletal: Positive for right pinky finger pain. Negative for back pain. Skin: Negative for rash. Neurological: Negative for headaches, focal weakness or numbness.  10-point ROS otherwise negative.  ____________________________________________   PHYSICAL EXAM:  VITAL SIGNS: ED Triage Vitals  Enc Vitals Group     BP 04/19/15 0305 144/85 mmHg     Pulse Rate  04/19/15 0305 89     Resp 04/19/15 0305 20     Temp 04/19/15 0305 98.5 F (36.9 C)     Temp Source 04/19/15 0305 Oral     SpO2 04/19/15 0305 98 %     Weight 04/19/15 0305 180 lb (81.647 kg)     Height 04/19/15 0305  (1.778 m)     Head Cir --      Peak Flow --      Pain Score 04/19/15 0306 9     Pain Loc --      Pain Edu? --      Excl. in GC? --     Constitutional: Alert and oriented. Well appearing and in no acute distress. Eyes: Conjunctivae are normal. PERRL. EOMI. Head:  Atraumatic. Nose: No congestion/rhinnorhea. Mouth/Throat: Mucous membranes are moist.  Oropharynx non-erythematous. Neck: No stridor.  No cervical spinal tenderness to palpation. Cardiovascular: Normal rate, regular rhythm. Grossly normal heart sounds.  Good peripheral circulation. Respiratory: Normal respiratory effort.  No retractions. Lungs CTAB. Gastrointestinal: Soft and nontender. No distention. No abdominal bruits. No CVA tenderness. Musculoskeletal: Right distal fifth digit tender to palpation with mild swelling noted. 2+ radial pulses. Brisk, less than 5 second capillary refill. Full motor strength and sensation. Neurologic:  Normal speech and language. No gross focal neurologic deficits are appreciated. No gait instability. Skin:  Skin is warm, dry and intact. No rash noted. Psychiatric: Mood and affect are normal. Speech and behavior are normal.  ____________________________________________   LABS (all labs ordered are listed, but only abnormal results are displayed)  Labs Reviewed - No data to display ____________________________________________  EKG  None ____________________________________________  RADIOLOGY  Right little finger (viewed by me, interpreted per Dr. Gwenyth Bender): Bony avulsion injury of the dorsal base of the distal phalanx of the fifth digit. ____________________________________________   PROCEDURES  Procedure(s) performed:   SPLINT APPLICATION Date/Time: 6:27 AM Authorized by: Irean Hong Consent: Verbal consent obtained. Risks and benefits: risks, benefits and alternatives were discussed Consent given by: patient Splint applied by: nurse Location details: right 5th digit Splint type: finger splint Supplies used: aluminum/foam finger splint Post-procedure: The splinted body part was neurovascularly unchanged following the procedure. Patient tolerance: Patient tolerated the procedure well with no immediate complications.    Critical Care  performed: No  ____________________________________________   INITIAL IMPRESSION / ASSESSMENT AND PLAN / ED COURSE  Pertinent labs & imaging results that were available during my care of the patient were reviewed by me and considered in my medical decision making (see chart for details).  45 year old right-hand dominant male with bony avulsion injury of the dorsal base of the distal phalanx of the right fifth digit. Aluminum finger splint applied by nurse, limited prescription for analgesia written and patient will follow-up with orthopedics. Strict return precautions given. Patient verbalizes understanding and agrees with plan of care. ____________________________________________   FINAL CLINICAL IMPRESSION(S) / ED DIAGNOSES  Final diagnoses:  Finger fracture, right, closed, initial encounter      Irean Hong, MD 04/19/15 [phone]

## 2015-04-19 NOTE — ED Notes (Addendum)
Pt states first noted pain and swelling to R 5th digit at 2330. Pt denies injury. Pt presents w/ swelling to L 5th digit w/ possible displacement and decreased range of motion. Pt drove self to ED.

## 2015-04-19 NOTE — Discharge Instructions (Signed)
1. Take pain medicine as needed (Norco #10). 2. Keep splint clean and dry. 3. Return to the ER for worsening symptoms, increased swelling, or any concerns.  Finger Fracture Fractures of fingers are breaks in the bones of the fingers. There are many types of fractures. There are different ways of treating these fractures. Your health care provider will discuss the best way to treat your fracture. CAUSES Traumatic injury is the main cause of broken fingers. These include:  Injuries while playing sports.  Workplace injuries.  Falls. RISK FACTORS Activities that can increase your risk of finger fractures include:  Sports.  Workplace activities that involve machinery.  A condition called osteoporosis, which can make your bones less dense and cause them to fracture more easily. SIGNS AND SYMPTOMS The main symptoms of a broken finger are pain and swelling within 15 minutes after the injury. Other symptoms include:  Bruising of your finger.  Stiffness of your finger.  Numbness of your finger.  Exposed bones (compound fracture) if the fracture is severe. DIAGNOSIS  The best way to diagnose a broken bone is with X-ray imaging. Additionally, your health care provider will use this X-ray image to evaluate the position of the broken finger bones.  TREATMENT  Finger fractures can be treated with:   Nonreduction--This means the bones are in place. The finger is splinted without changing the positions of the bone pieces. The splint is usually left on for about a week to 10 days. This will depend on your fracture and what your health care provider thinks.  Closed reduction--The bones are put back into position without using surgery. The finger is then splinted.  Open reduction and internal fixation--The fracture site is opened. Then the bone pieces are fixed into place with pins or some type of hardware. This is seldom required. It depends on the severity of the fracture. HOME CARE  INSTRUCTIONS   Follow your health care provider's instructions regarding activities, exercises, and physical therapy.  Only take over-the-counter or prescription medicines for pain, discomfort, or fever as directed by your health care provider. SEEK MEDICAL CARE IF: You have pain or swelling that limits the motion or use of your fingers. SEEK IMMEDIATE MEDICAL CARE IF:  Your finger becomes numb. MAKE SURE YOU:   Understand these instructions.  Will watch your condition.  Will get help right away if you are not doing well or get worse.   This information is not intended to replace advice given to you by your health care provider. Make sure you discuss any questions you have with your health care provider.   Document Released: 06/11/2000 Document Revised: 12/18/2012 Document Reviewed: 10/09/2012 Elsevier Interactive Patient Education [employer]

## 2015-12-16 ENCOUNTER — Emergency Department: Payer: Medicaid Other

## 2015-12-16 ENCOUNTER — Encounter: Payer: Self-pay | Admitting: Emergency Medicine

## 2015-12-16 ENCOUNTER — Emergency Department
Admission: EM | Admit: 2015-12-16 | Discharge: 2015-12-16 | Disposition: A | Discharge status: Home / Self Care | Payer: Medicaid Other | Attending: Student | Admitting: Student

## 2015-12-16 DIAGNOSIS — M7121 Synovial cyst of popliteal space [Baker], right knee: Secondary | ICD-10-CM | POA: Insufficient documentation

## 2015-12-16 DIAGNOSIS — M7989 Other specified soft tissue disorders: Secondary | ICD-10-CM | POA: Diagnosis present

## 2015-12-16 DIAGNOSIS — F17210 Nicotine dependence, cigarettes, uncomplicated: Secondary | ICD-10-CM | POA: Diagnosis not present

## 2015-12-16 MED ORDER — ACETAMINOPHEN 500 MG PO TABS
1000.0000 mg | ORAL_TABLET | Freq: Once | ORAL | Status: AC
  Administered 2015-12-16: 1000 mg via ORAL
  Filled 2015-12-16: qty 2

## 2015-12-16 MED ORDER — TRAMADOL HCL 50 MG PO TABS: 50.0000 mg | ORAL_TABLET | Freq: Four times a day (QID) | ORAL | 0 refills | Status: DC | PRN

## 2015-12-16 NOTE — ED Notes (Signed)
States he has had some issues with swelling to both lower ext. For "awhile"  On arrival no swelling noted to lower ext  Positive pulses noted denies any fever injury or SOB.  Min swelling noted to right knee  states he has had some "popping" to same knee

## 2015-12-16 NOTE — ED Provider Notes (Signed)
[facility] Emergency Department Provider Note   ____________________________________________   First MD Initiated Contact with Patient 12/16/15 [phone]     (approximate)  I have reviewed the triage vital signs and the nursing notes.   HISTORY  Chief Complaint Leg Swelling    HPI Manuel Harmon is a 45 y.o. male with history of hypertension who presents for evaluation of 8 days of intermittent right lower extremity swelling and pain behind the knee, atraumatic, gradual onset, currently mild, no modifying factors. Patient reports he noted some swelling in his right leg early last week and then he drove to [place] where he reports he developed more swelling in the right leg as well as some swelling in the left leg. The swelling has resolved in the left leg, he feels as if it persists in the right leg. He denies any chest pain or difficulty breathing. No nausea, vomiting, diarrhea, fevers or chills. This is happened to him several times over the years.   Past Medical History:  Diagnosis Date  . Anxiety and depression   . Diverticulosis of colon   . History of diverticulitis of colon   . UTI (lower urinary tract infection)     Patient Active Problem List   Diagnosis Date Noted  . BP (high blood pressure) 03/23/2015  . Current tobacco use 03/23/2015  . Depression 03/13/2014  . 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 Anxiety disorder 03/13/2014  . PTSD (post-traumatic stress disorder) 03/13/2014  . Cocaine use disorder, mild, abuse 03/13/2014  . Cannabis use disorder, moderate, dependence (HCC) 03/13/2014  . Suture reaction 01/15/2014    Past Surgical History:  Procedure Laterality Date  . LEFT COLECTOMY AND APPENDECTOMY  05-31-2007  . RIGHT ORCHIOPEXY INGUINAL APPROACH AND HYDROCELECTOMY  1989  . SCAR REVISION N/A 01/15/2014   Procedure: SCAR REVISION AND REMOVAL OF SUTURE MATERIAL FROM ABDOMINAL WALL;   Surgeon: Darnell Level, MD;  Location: [facility];  Service: General;  Laterality: N/A;    Prior to Admission medications   Medication Sig Start Date End Date Taking? Authorizing Provider  clonazePAM (KLONOPIN) 1 MG tablet Take 1 mg by mouth 2 (two) times daily.    Historical Provider, MD  HYDROcodone-acetaminophen (NORCO) 5-325 MG tablet Take 1 tablet by mouth every 6 (six) hours as needed for moderate pain. 04/19/15   Irean Hong, MD  lisinopril (PRINIVIL,ZESTRIL) 20 MG tablet Take 1 tablet (20 mg total) by mouth every morning. 03/17/14   Thermon Leyland, NP  Multiple Vitamin (MULTIVITAMIN WITH MINERALS) TABS tablet Take 1 tablet by mouth daily. 03/17/14   Thermon Leyland, NP    Allergies Wellbutrin [bupropion] and Ibuprofen  Family History  Problem Relation Age of Onset  . Drug abuse Father   . Prostate cancer Paternal Grandfather   . Bladder Cancer Neg Hx   . Kidney cancer Neg Hx     Social History Social History  Substance Use Topics  . Smoking status: Current Every Day Smoker    Packs/day: 1.00    Years: 33.00    Types: Cigarettes  . Smokeless tobacco: Never Used  . Alcohol use 1.2 oz/week    2 Standard drinks or equivalent per week    Review of Systems Constitutional: No fever/chills Eyes: No visual changes. ENT: No sore throat. Cardiovascular: Denies chest pain. Respiratory: Denies shortness of breath. Gastrointestinal: No abdominal pain.  No nausea, no vomiting.  No diarrhea.  No constipation. Genitourinary:  Negative for dysuria. Musculoskeletal: Negative for back pain. Skin: Negative for rash. Neurological: Negative for headaches, focal weakness or numbness.  10-point ROS otherwise negative.  ____________________________________________   PHYSICAL EXAM:  VITAL SIGNS: ED Triage Vitals  Enc Vitals Group     BP 12/16/15 0904 139/74     Pulse Rate 12/16/15 0904 74     Resp 12/16/15 0904 18     Temp 12/16/15 0904 98.6 F (37 C)     Temp Source  12/16/15 0904 Oral     SpO2 12/16/15 0904 98 %     Weight 12/16/15 0902 180 lb (81.6 kg)     Height 12/16/15 0924 5\' 10"  (1.778 m)     Head Circumference --      Peak Flow --      Pain Score 12/16/15 0903 9     Pain Loc --      Pain Edu? --      Excl. in GC? --     Constitutional: Alert and oriented. Well appearing and in no acute distress. Eyes: Conjunctivae are normal. PERRL. EOMI. Head: Atraumatic. Nose: No congestion/rhinnorhea. Mouth/Throat: Mucous membranes are moist.  Oropharynx non-erythematous. Neck: No stridor.  Supple without meningismus. Cardiovascular: Normal rate, regular rhythm. Grossly normal heart sounds.  Good peripheral circulation. Respiratory: Normal respiratory effort.  No retractions. Lungs CTAB. Gastrointestinal: Soft and nontender. No distention.  No CVA tenderness. Genitourinary: deferred Musculoskeletal: 2+ DP pulses bilaterally, wiggles the toes. No appreciable swelling in the either lower extremity however the patient does have tenderness to palpation in the right calf as well as the right popliteal fossa. Full active range of motion of the right knee without erythema, warmth, swelling or drainage. Neurologic:  Normal speech and language. No gross focal neurologic deficits are appreciated. No gait instability. Skin:  Skin is warm, dry and intact. No rash noted. Psychiatric: Mood and affect are normal. Speech and behavior are normal.  ____________________________________________   LABS (all labs ordered are listed, but only abnormal results are displayed)  Labs Reviewed - No data to display ____________________________________________  EKG  ED ECG REPORT I, Gayla DossGayle, Akim Watkinson A, the attending physician, personally viewed and interpreted this ECG.   Date: 12/16/2015  EKG Time: 09:19  Rate: 64  Rhythm: normal EKG, normal sinus rhythm  Axis: normal  Intervals:none  ST&T Change: No acute ST elevation or acute ST  depression.  ____________________________________________  RADIOLOGY  Venous doppler US right leg IMPRESSION:  Negative for deep venous thrombosis in right lower extremity.    Right Baker's cyst.    ____________________________________________   PROCEDURES  Procedure(s) performed: None  Procedures  Critical Care performed: No  ____________________________________________   INITIAL IMPRESSION / ASSESSMENT AND PLAN / ED COURSE  Pertinent labs & imaging results that were available during my care of the patient were reviewed by me and considered in my medical decision making (see chart for details).  Fayne MediateRalph J Gladney is a 45 y.o. male with history of hypertension who presents for evaluation of 8 days of intermittent right lower extremity swelling and pain behind the knee. On exam, he is well-appearing and in no acute distress. Vital signs stable, he is afebrile. The right leg is atraumatic without any obvious swelling, no erythema, full range of motion at the joint, neurovascularly intact in the right foot however he does have some tenderness in the popliteal fossa and the calf therefore will obtain venous Doppler ultrasound to rule out DVT. We'll treat him symptomatically reassess for disposition.  -----------------------------------------  1:57 PM on 12/16/2015 -----------------------------------------  Doppler ultrasound shows a Baker cyst which is the most likely cause of the patient's discomfort. No dvt. We discussed return precautions, need for close PCP and orthopedic surgery follow-up and he is comfortable with the discharge plan. DC home.  Clinical Course     ____________________________________________   FINAL CLINICAL IMPRESSION(S) / ED DIAGNOSES  Final diagnoses:  Leg swelling  Baker's cyst of knee, right      NEW MEDICATIONS STARTED DURING THIS VISIT:  New Prescriptions   No medications on file     Note:  This document was prepared using Dragon voice  recognition software and may include unintentional dictation errors.    Gayla Doss, MD 12/16/15 1357

## 2015-12-16 NOTE — ED Notes (Signed)
Pt sitting at end of bed completely dressed eating sandwich tray , pt appears to be not in distress at this time; pt stating "I'm ready to go, I am hungry." apologized to pt for delays and I would let dr know

## 2015-12-16 NOTE — ED Notes (Signed)
PO meds given for discomfort  Informed pt of wait for U/S

## 2015-12-16 NOTE — ED Triage Notes (Signed)
Pt to ed with c/o swelling in feet and legs bilaterally x 1 week.

## 2016-04-13 ENCOUNTER — Ambulatory Visit: Payer: Medicaid Other | Admitting: Urology

## 2016-04-13 ENCOUNTER — Encounter: Payer: Self-pay | Admitting: Urology

## 2016-04-18 ENCOUNTER — Encounter: Payer: Self-pay | Admitting: Medical Oncology

## 2016-04-18 ENCOUNTER — Emergency Department
Admission: EM | Admit: 2016-04-18 | Discharge: 2016-04-18 | Disposition: A | Discharge status: Home / Self Care | Payer: Medicaid Other | Attending: Emergency Medicine | Admitting: Emergency Medicine

## 2016-04-18 DIAGNOSIS — R509 Fever, unspecified: Secondary | ICD-10-CM | POA: Diagnosis present

## 2016-04-18 DIAGNOSIS — F17210 Nicotine dependence, cigarettes, uncomplicated: Secondary | ICD-10-CM | POA: Diagnosis not present

## 2016-04-18 DIAGNOSIS — J111 Influenza due to unidentified influenza virus with other respiratory manifestations: Secondary | ICD-10-CM | POA: Diagnosis not present

## 2016-04-18 DIAGNOSIS — I10 Essential (primary) hypertension: Secondary | ICD-10-CM | POA: Diagnosis not present

## 2016-04-18 DIAGNOSIS — Z79899 Other long term (current) drug therapy: Secondary | ICD-10-CM | POA: Diagnosis not present

## 2016-04-18 HISTORY — DX: Essential (primary) hypertension: I10

## 2016-04-18 MED ORDER — GUAIFENESIN-CODEINE 100-10 MG/5ML PO SOLN: 5.0000 mL | ORAL | 0 refills | Status: DC | PRN

## 2016-04-18 MED ORDER — OSELTAMIVIR PHOSPHATE 75 MG PO CAPS: 75.0000 mg | ORAL_CAPSULE | Freq: Two times a day (BID) | ORAL | 0 refills | Status: AC

## 2016-04-18 NOTE — Discharge Instructions (Signed)
Follow-up with your primary care doctor if any continued problems. Tylenol as needed for fever, headache, body aches. Increase fluids. Tamiflu begin today for the next 5 days and Robitussin-AC as needed for cough. Do not drive while taking the cough medication as it has a narcotic and can cause drowsiness.

## 2016-04-18 NOTE — ED Provider Notes (Signed)
[facility] Emergency Department Provider Note  ____________________________________________   First MD Initiated Contact with Patient 04/18/16 [phone]     (approximate)  I have reviewed the triage vital signs and the nursing notes.   HISTORY  Chief Complaint Generalized Body Aches; Chills; and Fever    HPI Manuel Harmon is a 46 y.o. male patient is here with symptoms that began suddenly yesterday. Patient states that his child at home that had the flu last week with same symptoms. Patient said onset of fever, body aches, headache and runny nose. Patient is been taking over-the-counter medication without relief of his symptoms other than maybe fever. Patient denies any nausea, vomiting or diarrhea. He rates his pain as 8/10.   Past Medical History:  Diagnosis Date  . Anxiety and depression   . Diverticulosis of colon   . History of diverticulitis of colon   . Hypertension   . UTI (lower urinary tract infection)     Patient Active Problem List   Diagnosis Date Noted  . BP (high blood pressure) 03/23/2015  . Current tobacco use 03/23/2015  . Depression 03/13/2014  . 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 Anxiety disorder 03/13/2014  . PTSD (post-traumatic stress disorder) 03/13/2014  . Cocaine use disorder, mild, abuse 03/13/2014  . Cannabis use disorder, moderate, dependence (HCC) 03/13/2014  . Suture reaction 01/15/2014    Past Surgical History:  Procedure Laterality Date  . LEFT COLECTOMY AND APPENDECTOMY  05-31-2007  . RIGHT ORCHIOPEXY INGUINAL APPROACH AND HYDROCELECTOMY  1989  . SCAR REVISION N/A 01/15/2014   Procedure: SCAR REVISION AND REMOVAL OF SUTURE MATERIAL FROM ABDOMINAL WALL;  Surgeon: Darnell Level, MD;  Location: [facility];  Service: General;  Laterality: N/A;    Prior to Admission medications   Medication Sig Start Date End Date Taking? Authorizing Provider    clonazePAM (KLONOPIN) 1 MG tablet Take 1 mg by mouth 2 (two) times daily.    Historical Provider, MD  guaiFENesin-codeine 100-10 MG/5ML syrup Take 5 mLs by mouth every 4 (four) hours as needed. 04/18/16   Tommi Rumps, PA-C  lisinopril (PRINIVIL,ZESTRIL) 20 MG tablet Take 1 tablet (20 mg total) by mouth every morning. 03/17/14   Thermon Leyland, NP  Multiple Vitamin (MULTIVITAMIN WITH MINERALS) TABS tablet Take 1 tablet by mouth daily. 03/17/14   Thermon Leyland, NP  oseltamivir (TAMIFLU) 75 MG capsule Take 1 capsule (75 mg total) by mouth 2 (two) times daily. 04/18/16 04/23/16  Tommi Rumps, PA-C    Allergies Penicillins; Wellbutrin [bupropion]; and Ibuprofen  Family History  Problem Relation Age of Onset  . Drug abuse Father   . Prostate cancer Paternal Grandfather   . Bladder Cancer Neg Hx   . Kidney cancer Neg Hx     Social History Social History  Substance Use Topics  . Smoking status: Current Every Day Smoker    Packs/day: 1.00    Years: 33.00    Types: Cigarettes  . Smokeless tobacco: Never Used  . Alcohol use 1.2 oz/week    2 Standard drinks or equivalent per week    Review of Systems Constitutional: Positive fever/chills Eyes: No visual changes. ENT: Positive sore throat. Positive rhinorrhea Cardiovascular: Denies chest pain. Respiratory: Denies shortness of breath. Gastrointestinal: No abdominal pain.  No nausea, no vomiting.  No diarrhea.   Musculoskeletal: Negative for back pain. Skin: Negative for rash. Neurological: Positive for headaches, focal weakness or numbness.  10-point  ROS otherwise negative.  ____________________________________________   PHYSICAL EXAM:  VITAL SIGNS: ED Triage Vitals  Enc Vitals Group     BP 04/18/16 0800 130/82     Pulse Rate 04/18/16 0800 84     Resp 04/18/16 0800 16     Temp 04/18/16 0800 98.1 F (36.7 C)     Temp Source 04/18/16 0800 Oral     SpO2 04/18/16 0800 98 %     Weight 04/18/16 0758 189 lb (85.7 kg)     Height  04/18/16 0758 5\' 10"  (1.778 m)     Head Circumference --      Peak Flow --      Pain Score 04/18/16 0759 8     Pain Loc --      Pain Edu? --      Excl. in GC? --     Constitutional: Alert and oriented. Well appearing and in no acute distress. Eyes: Conjunctivae are normal. PERRL. EOMI. Head: Atraumatic. Nose: Mild congestion/rhinnorhea. EACs and TMs are clear bilaterally. Mouth/Throat: Mucous membranes are moist.  Oropharynx non-erythematous. Neck: No stridor.   Hematological/Lymphatic/Immunilogical: No cervical lymphadenopathy. Cardiovascular: Normal rate, regular rhythm. Grossly normal heart sounds.  Good peripheral circulation. Respiratory: Normal respiratory effort.  No retractions. Lungs CTAB. Gastrointestinal: Soft and nontender. No distention. Musculoskeletal: No lower extremity tenderness nor edema.  No joint effusions. Neurologic:  Normal speech and language. No gross focal neurologic deficits are appreciated. No gait instability. Skin:  Skin is warm, dry and intact. No rash noted. Psychiatric: Mood and affect are normal. Speech and behavior are normal.  ____________________________________________   LABS (all labs ordered are listed, but only abnormal results are displayed)  Labs Reviewed - No data to display  PROCEDURES  Procedure(s) performed: None  Procedures  Critical Care performed: No  ____________________________________________   INITIAL IMPRESSION / ASSESSMENT AND PLAN / ED COURSE  Pertinent labs & imaging results that were available during my care of the patient were reviewed by me and considered in my medical decision making (see chart for details).  Patient here with influenza-like symptoms and close contact with influenza last week at home. Patient was started on Tamiflu 75 mg twice a day for 5 days and Robitussin-AC as needed for cough and congestion. Patient is advised to increase fluids and also take Tylenol for fever, headaches or body aches.  He is to follow-up with his PCP if any continued problems.      ____________________________________________   FINAL CLINICAL IMPRESSION(S) / ED DIAGNOSES  Final diagnoses:  Influenza      NEW MEDICATIONS STARTED DURING THIS VISIT:  Discharge Medication List as of 04/18/2016  8:57 AM    START taking these medications   Details  guaiFENesin-codeine 100-10 MG/5ML syrup Take 5 mLs by mouth every 4 (four) hours as needed., Starting Tue 04/18/2016, Print    oseltamivir (TAMIFLU) 75 MG capsule Take 1 capsule (75 mg total) by mouth 2 (two) times daily., Starting Tue 04/18/2016, Until Sun 04/23/2016, Print         Note:  This document was prepared using Dragon voice recognition software and may include unintentional dictation errors.    Tommi Rumpshonda L Kiyoshi Schaab, PA-C 04/18/16 1557    Jene Everyobert Kinner, MD 04/24/16 1110

## 2016-04-18 NOTE — ED Notes (Signed)
See triage note  States recently exposed to the flu  Developed fever,body aches headaches and runny nose yesterday  Afebrile on arrival

## 2016-04-18 NOTE — ED Triage Notes (Signed)
Pt reports flu like sx's that began yesterday, child in home had flu last week.

## 2016-06-06 ENCOUNTER — Emergency Department
Admission: EM | Admit: 2016-06-06 | Discharge: 2016-06-06 | Disposition: A | Discharge status: Home / Self Care | Payer: Medicaid Other | Attending: Emergency Medicine | Admitting: Emergency Medicine

## 2016-06-06 ENCOUNTER — Encounter: Payer: Self-pay | Admitting: Emergency Medicine

## 2016-06-06 DIAGNOSIS — Y999 Unspecified external cause status: Secondary | ICD-10-CM | POA: Insufficient documentation

## 2016-06-06 DIAGNOSIS — S6991XA Unspecified injury of right wrist, hand and finger(s), initial encounter: Secondary | ICD-10-CM | POA: Diagnosis present

## 2016-06-06 DIAGNOSIS — Y9389 Activity, other specified: Secondary | ICD-10-CM | POA: Diagnosis not present

## 2016-06-06 DIAGNOSIS — F17210 Nicotine dependence, cigarettes, uncomplicated: Secondary | ICD-10-CM | POA: Insufficient documentation

## 2016-06-06 DIAGNOSIS — I10 Essential (primary) hypertension: Secondary | ICD-10-CM | POA: Diagnosis not present

## 2016-06-06 DIAGNOSIS — Z23 Encounter for immunization: Secondary | ICD-10-CM | POA: Diagnosis not present

## 2016-06-06 DIAGNOSIS — W268XXA Contact with other sharp object(s), not elsewhere classified, initial encounter: Secondary | ICD-10-CM | POA: Diagnosis not present

## 2016-06-06 DIAGNOSIS — S61210A Laceration without foreign body of right index finger without damage to nail, initial encounter: Secondary | ICD-10-CM | POA: Diagnosis not present

## 2016-06-06 DIAGNOSIS — S61215A Laceration without foreign body of left ring finger without damage to nail, initial encounter: Secondary | ICD-10-CM | POA: Diagnosis not present

## 2016-06-06 DIAGNOSIS — Y929 Unspecified place or not applicable: Secondary | ICD-10-CM | POA: Diagnosis not present

## 2016-06-06 MED ORDER — TETANUS-DIPHTH-ACELL PERTUSSIS 5-2.5-18.5 LF-MCG/0.5 IM SUSP
0.5000 mL | Freq: Once | INTRAMUSCULAR | Status: AC
  Administered 2016-06-06: 0.5 mL via INTRAMUSCULAR
  Filled 2016-06-06: qty 0.5

## 2016-06-06 MED ORDER — LIDOCAINE HCL (PF) 1 % IJ SOLN
5.0000 mL | Freq: Once | INTRAMUSCULAR | Status: AC
  Administered 2016-06-06: 5 mL
  Filled 2016-06-06: qty 5

## 2016-06-06 MED ORDER — BACITRACIN ZINC 500 UNIT/GM EX OINT
TOPICAL_OINTMENT | Freq: Once | CUTANEOUS | Status: DC
  Filled 2016-06-06: qty 0.9

## 2016-06-06 MED ORDER — TRAMADOL HCL 50 MG PO TABS: 50.0000 mg | ORAL_TABLET | Freq: Once | ORAL | Status: DC
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
## 2016-06-06 NOTE — ED Notes (Signed)
superficial laceration notes to several fingers

## 2016-06-06 NOTE — Discharge Instructions (Signed)
Keep the wounds clean, dry, and covered. Have the sutures removed in 10-12 days.
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
## 2016-06-19 ENCOUNTER — Emergency Department
Admission: EM | Admit: 2016-06-19 | Discharge: 2016-06-19 | Disposition: A | Discharge status: Home / Self Care | Payer: Medicaid Other | Attending: Emergency Medicine | Admitting: Emergency Medicine

## 2016-06-19 ENCOUNTER — Encounter: Payer: Self-pay | Admitting: *Deleted

## 2016-06-19 DIAGNOSIS — I10 Essential (primary) hypertension: Secondary | ICD-10-CM | POA: Diagnosis not present

## 2016-06-19 DIAGNOSIS — Z4802 Encounter for removal of sutures: Secondary | ICD-10-CM

## 2016-06-19 DIAGNOSIS — T814XXA Infection following a procedure, initial encounter: Secondary | ICD-10-CM | POA: Insufficient documentation

## 2016-06-19 DIAGNOSIS — Z79899 Other long term (current) drug therapy: Secondary | ICD-10-CM | POA: Insufficient documentation

## 2016-06-19 DIAGNOSIS — F17210 Nicotine dependence, cigarettes, uncomplicated: Secondary | ICD-10-CM | POA: Insufficient documentation

## 2016-06-19 DIAGNOSIS — Y828 Other medical devices associated with adverse incidents: Secondary | ICD-10-CM | POA: Diagnosis not present

## 2016-06-19 DIAGNOSIS — L089 Local infection of the skin and subcutaneous tissue, unspecified: Secondary | ICD-10-CM

## 2016-06-19 MED ORDER — NAPROXEN 500 MG PO TABS: 500.0000 mg | ORAL_TABLET | Freq: Two times a day (BID) | ORAL | Status: DC

## 2016-06-19 MED ORDER — SULFAMETHOXAZOLE-TRIMETHOPRIM 800-160 MG PO TABS: 1.0000 | ORAL_TABLET | Freq: Two times a day (BID) | ORAL | 0 refills | Status: DC

## 2016-06-19 NOTE — ED Triage Notes (Signed)
Pt had sutures placed in right index finger on 3/27, arrives today with swelling and pain the that finger, wound dry, swelling noted, pt able to move finger

## 2016-06-19 NOTE — ED Provider Notes (Signed)
[facility] Emergency Department Provider Note   ____________________________________________   First MD Initiated Contact with Patient 06/19/16 [phone]     (approximate)  I have reviewed the triage vital signs and the nursing notes.   HISTORY  Chief Complaint swelling of right index finger    HPI Manuel Harmon is a 46 y.o. male patient is presenting for suture removal from the right index finger. Patient was seen in this department on 06/06/2016.Patient stated the last couple days  pain and swelling to the area. Patient rates pain as a 9/10. Patient described a pain as "achy". No positive measures for his complaint.   Past Medical History:  Diagnosis Date  . Anxiety and depression   . Diverticulosis of colon   . History of diverticulitis of colon   . Hypertension   . UTI (lower urinary tract infection)     Patient Active Problem List   Diagnosis Date Noted  . BP (high blood pressure) 03/23/2015  . Current tobacco use 03/23/2015  . Depression 03/13/2014  . 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 Anxiety disorder 03/13/2014  . PTSD (post-traumatic stress disorder) 03/13/2014  . Cocaine use disorder, mild, abuse 03/13/2014  . Cannabis use disorder, moderate, dependence (HCC) 03/13/2014  . Suture reaction 01/15/2014    Past Surgical History:  Procedure Laterality Date  . LEFT COLECTOMY AND APPENDECTOMY  05-31-2007  . RIGHT ORCHIOPEXY INGUINAL APPROACH AND HYDROCELECTOMY  1989  . SCAR REVISION N/A 01/15/2014   Procedure: SCAR REVISION AND REMOVAL OF SUTURE MATERIAL FROM ABDOMINAL WALL;  Surgeon: Darnell Level, MD;  Location: [facility];  Service: General;  Laterality: N/A;    Prior to Admission medications   Medication Sig Start Date End Date Taking? Authorizing Provider  clonazePAM (KLONOPIN) 1 MG tablet Take 1 mg by mouth 2 (two) times daily.    Historical Provider, MD    guaiFENesin-codeine 100-10 MG/5ML syrup Take 5 mLs by mouth every 4 (four) hours as needed. 04/18/16   Tommi Rumps, PA-C  lisinopril (PRINIVIL,ZESTRIL) 20 MG tablet Take 1 tablet (20 mg total) by mouth every morning. 03/17/14   Thermon Leyland, NP  Multiple Vitamin (MULTIVITAMIN WITH MINERALS) TABS tablet Take 1 tablet by mouth daily. 03/17/14   Thermon Leyland, NP  naproxen (NAPROSYN) 500 MG tablet Take 1 tablet (500 mg total) by mouth 2 (two) times daily with a meal. 06/19/16   Joni Reining, PA-C  sulfamethoxazole-trimethoprim (BACTRIM DS,SEPTRA DS) 800-160 MG tablet Take 1 tablet by mouth 2 (two) times daily. 06/19/16   Joni Reining, PA-C    Allergies Penicillins; Wellbutrin [bupropion]; and Ibuprofen  Family History  Problem Relation Age of Onset  . Drug abuse Father   . Prostate cancer Paternal Grandfather   . Bladder Cancer Neg Hx   . Kidney cancer Neg Hx     Social History Social History  Substance Use Topics  . Smoking status: Current Every Day Smoker    Packs/day: 1.00    Years: 33.00    Types: Cigarettes  . Smokeless tobacco: Never Used  . Alcohol use 1.2 oz/week    2 Standard drinks or equivalent per week    Review of Systems  Constitutional: No fever/chills Eyes: No visual changes. ENT: No sore throat. Cardiovascular: Denies chest pain. Respiratory: Denies shortness of breath. Gastrointestinal: No abdominal pain.  No nausea, no vomiting.  No diarrhea.  No constipation. Genitourinary: Negative for dysuria. Musculoskeletal: Negative for  back pain. Skin: Negative for rash. Neurological: Negative for headaches, focal weakness or numbness. Psychiatric:Drug abuse, anxiety, and depression. Endocrine: Hypertension Hematological/Lymphatic: Allergic/Immunilogical: See medication list   ____________________________________________   PHYSICAL EXAM:  VITAL SIGNS: ED Triage Vitals [06/19/16 0840]  Enc Vitals Group     BP 130/83     Pulse Rate 83     Resp 18      Temp 98.9 F (37.2 C)     Temp Source Oral     SpO2 99 %     Weight 190 lb (86.2 kg)     Height  (1.778 m)     Head Circumference      Peak Flow      Pain Score 9     Pain Loc      Pain Edu?      Excl. in GC?     Constitutional: Alert and oriented. Well appearing and in no acute distress. Eyes: Conjunctivae are normal. PERRL. EOMI. Head: Atraumatic. Nose: No congestion/rhinnorhea. Mouth/Throat: Mucous membranes are moist.  Oropharynx non-erythematous. Neck: No stridor.  No cervical spine tenderness to palpation. Hematological/Lymphatic/Immunilogical: No cervical lymphadenopathy. Cardiovascular: Normal rate, regular rhythm. Grossly normal heart sounds.  Good peripheral circulation. Respiratory: Normal respiratory effort.  No retractions. Lungs CTAB. Gastrointestinal: Soft and nontender. No distention. No abdominal bruits. No CVA tenderness. Musculoskeletal: No lower extremity tenderness nor edema.  No joint effusions. Neurologic:  Normal speech and language. No gross focal neurologic deficits are appreciated. No gait instability. Skin:  Skin is warm, dry and intact. No rash noted. Edema and mild erythema to the second digit left hand. Psychiatric: Mood and affect are normal. Speech and behavior are normal.  ____________________________________________   LABS (all labs ordered are listed, but only abnormal results are displayed)  Labs Reviewed - No data to display ____________________________________________  EKG   ____________________________________________  RADIOLOGY   ____________________________________________   PROCEDURES  Procedure(s) performed: None  Procedures  Critical Care performed: No  ____________________________________________   INITIAL IMPRESSION / ASSESSMENT AND PLAN / ED COURSE  Pertinent labs & imaging results that were available during my care of the patient were reviewed by me and considered in my medical decision making (see  chart for details).  He'll finger laceration second digit left hand with mild cellulitis.      ____________________________________________   FINAL CLINICAL IMPRESSION(S) / ED DIAGNOSES  Final diagnoses:  Infected finger  Encounter for removal of sutures  4 interrupted sutures were removed. Patient given discharge care instructions. Patient given a prescription for Bactrim and naproxen. Patient advised follow-up with family doctor for continual care.  NEW MEDICATIONS STARTED DURING THIS VISIT:  New Prescriptions   NAPROXEN (NAPROSYN) 500 MG TABLET    Take 1 tablet (500 mg total) by mouth 2 (two) times daily with a meal.   SULFAMETHOXAZOLE-TRIMETHOPRIM (BACTRIM DS,SEPTRA DS) 800-160 MG TABLET    Take 1 tablet by mouth 2 (two) times daily.     Note:  This document was prepared using Dragon voice recognition software and may include unintentional dictation errors.    Joni Reining, PA-C 06/19/16 1610    Jene Every, MD 06/20/16 [phone]

## 2016-07-24 ENCOUNTER — Other Ambulatory Visit: Payer: Self-pay | Admitting: Physician Assistant

## 2016-08-11 ENCOUNTER — Emergency Department: Payer: Medicaid Other

## 2016-08-11 ENCOUNTER — Encounter: Payer: Self-pay | Admitting: Emergency Medicine

## 2016-08-11 ENCOUNTER — Emergency Department
Admission: EM | Admit: 2016-08-11 | Discharge: 2016-08-11 | Disposition: A | Discharge status: Home / Self Care | Payer: Medicaid Other | Attending: Emergency Medicine | Admitting: Emergency Medicine

## 2016-08-11 DIAGNOSIS — J020 Streptococcal pharyngitis: Secondary | ICD-10-CM | POA: Diagnosis not present

## 2016-08-11 DIAGNOSIS — T391X1A Poisoning by 4-Aminophenol derivatives, accidental (unintentional), initial encounter: Secondary | ICD-10-CM | POA: Diagnosis present

## 2016-08-11 DIAGNOSIS — I10 Essential (primary) hypertension: Secondary | ICD-10-CM | POA: Insufficient documentation

## 2016-08-11 DIAGNOSIS — F17210 Nicotine dependence, cigarettes, uncomplicated: Secondary | ICD-10-CM | POA: Insufficient documentation

## 2016-08-11 DIAGNOSIS — Z79899 Other long term (current) drug therapy: Secondary | ICD-10-CM | POA: Insufficient documentation

## 2016-08-11 LAB — ACETAMINOPHEN LEVEL: ACETAMINOPHEN (TYLENOL), SERUM: 19 ug/mL (ref 10–30)

## 2016-08-11 LAB — COMPREHENSIVE METABOLIC PANEL
ALK PHOS: 86 U/L (ref 38–126)
ALT: 14 U/L — AB (ref 17–63)
AST: 19 U/L (ref 15–41)
Albumin: 4.2 g/dL (ref 3.5–5.0)
Anion gap: 7 (ref 5–15)
BILIRUBIN TOTAL: 0.7 mg/dL (ref 0.3–1.2)
BUN: 16 mg/dL (ref 6–20)
CALCIUM: 9.3 mg/dL (ref 8.9–10.3)
CO2: 25 mmol/L (ref 22–32)
CREATININE: 1.34 mg/dL — AB (ref 0.61–1.24)
Chloride: 105 mmol/L (ref 101–111)
Glucose, Bld: 108 mg/dL — ABNORMAL HIGH (ref 65–99)
Potassium: 3.7 mmol/L (ref 3.5–5.1)
Sodium: 137 mmol/L (ref 135–145)
TOTAL PROTEIN: 7.7 g/dL (ref 6.5–8.1)

## 2016-08-11 LAB — CBC WITH DIFFERENTIAL/PLATELET
BASOS ABS: 0.1 10*3/uL (ref 0–0.1)
Basophils Relative: 0 %
Eosinophils Absolute: 0.3 10*3/uL (ref 0–0.7)
Eosinophils Relative: 1 %
HEMATOCRIT: 48.5 % (ref 40.0–52.0)
Hemoglobin: 16.6 g/dL (ref 13.0–18.0)
LYMPHS ABS: 1.7 10*3/uL (ref 1.0–3.6)
LYMPHS PCT: 7 %
MCH: 29.4 pg (ref 26.0–34.0)
MCHC: 34.2 g/dL (ref 32.0–36.0)
MCV: 86 fL (ref 80.0–100.0)
Monocytes Absolute: 1.6 10*3/uL — ABNORMAL HIGH (ref 0.2–1.0)
Monocytes Relative: 6 %
NEUTROS ABS: 21.9 10*3/uL — AB (ref 1.4–6.5)
Neutrophils Relative %: 86 %
Platelets: 329 10*3/uL (ref 150–440)
RBC: 5.64 MIL/uL (ref 4.40–5.90)
RDW: 15.2 % — ABNORMAL HIGH (ref 11.5–14.5)
WBC: 25.5 10*3/uL — ABNORMAL HIGH (ref 3.8–10.6)

## 2016-08-11 LAB — POCT RAPID STREP A: STREPTOCOCCUS, GROUP A SCREEN (DIRECT): POSITIVE — AB

## 2016-08-11 LAB — TROPONIN I: Troponin I: <0.03 ng/mL (ref <0.03)

## 2016-08-11 LAB — SALICYLATE LEVEL: Salicylate Lvl: <7 mg/dL (ref 2.8–30.0)

## 2016-08-11 LAB — LIPASE, BLOOD: LIPASE: 27 U/L (ref 11–51)

## 2016-08-11 MED ORDER — HYDROCOD POLST-CPM POLST ER 10-8 MG/5ML PO SUER: 5.0000 mL | Freq: Two times a day (BID) | ORAL | 0 refills | Status: DC

## 2016-08-11 MED ORDER — DEXTROSE 5 % IV SOLN
INTRAVENOUS | Status: AC
  Filled 2016-08-11: qty 10

## 2016-08-11 MED ORDER — CEFTRIAXONE SODIUM IN DEXTROSE 20 MG/ML IV SOLN
1.0000 g | Freq: Once | INTRAVENOUS | Status: AC
  Administered 2016-08-11: 1 g via INTRAVENOUS

## 2016-08-11 MED ORDER — SODIUM CHLORIDE 0.9 % IV BOLUS (SEPSIS)
1000.0000 mL | Freq: Once | INTRAVENOUS | Status: AC
  Administered 2016-08-11: 1000 mL via INTRAVENOUS

## 2016-08-11 MED ORDER — AZITHROMYCIN 250 MG PO TABS: ORAL_TABLET | ORAL | 0 refills | Status: DC

## 2016-08-11 MED ORDER — KETOROLAC TROMETHAMINE 30 MG/ML IJ SOLN
15.0000 mg | Freq: Once | INTRAMUSCULAR | Status: AC
  Administered 2016-08-11: 15 mg via INTRAVENOUS
  Filled 2016-08-11: qty 1

## 2016-08-11 MED ORDER — DEXAMETHASONE SODIUM PHOSPHATE 10 MG/ML IJ SOLN
10.0000 mg | Freq: Once | INTRAMUSCULAR | Status: AC
  Administered 2016-08-11: 10 mg via INTRAVENOUS
  Filled 2016-08-11: qty 1

## 2016-08-11 NOTE — ED Provider Notes (Signed)
[facility] Emergency Department Provider Note  ____________________________________________  Time seen: Approximately 3:48 PM  I have reviewed the triage vital signs and the nursing notes.   HISTORY  Chief Complaint Cough and Drug Overdose   HPI Manuel Harmon is a 46 y.o. male with no significant past medical history who presents for evaluation of body aches, sore throat, cough since yesterday. Patient has also had severe sore throat that is worse when he swallows, constant since yesterday. No neck stiffness or headache. He has had diffuse body aches and chills. Also has had several episodes of diarrhea. Has a cough productive of yellow sputum with no chest pain or shortness of breath. Patient reports that he took 24 x 500mg  tylenol in the last 24 hours for his pain. No intentional overdose. Patient denies abdominal pain, nausea or vomiting, rash, dysuria or hematuria.  Past Medical History:  Diagnosis Date  . Anxiety and depression   . Diverticulosis of colon   . History of diverticulitis of colon   . Hypertension   . UTI (lower urinary tract infection)     Patient Active Problem List   Diagnosis Date Noted  . BP (high blood pressure) 03/23/2015  . Current tobacco use 03/23/2015  . Depression 03/13/2014  . 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 Anxiety disorder 03/13/2014  . PTSD (post-traumatic stress disorder) 03/13/2014  . Cocaine use disorder, mild, abuse 03/13/2014  . Cannabis use disorder, moderate, dependence (HCC) 03/13/2014  . Suture reaction 01/15/2014    Past Surgical History:  Procedure Laterality Date  . LEFT COLECTOMY AND APPENDECTOMY  05-31-2007  . RIGHT ORCHIOPEXY INGUINAL APPROACH AND HYDROCELECTOMY  1989  . SCAR REVISION N/A 01/15/2014   Procedure: SCAR REVISION AND REMOVAL OF SUTURE MATERIAL FROM ABDOMINAL WALL;  Surgeon: Darnell Levelodd Gerkin, MD;  [facility];   Service: General;  Laterality: N/A;    Prior to Admission medications   Medication Sig Start Date End Date Taking? Authorizing Provider  clonazePAM (KLONOPIN) 1 MG tablet Take 1 mg by mouth 2 (two) times daily.   Yes [provider]  lisinopril (PRINIVIL,ZESTRIL) 20 MG tablet Take 1 tablet (20 mg total) by mouth every morning. 03/17/14  Yes Thermon Leylandavis, Laura A, NP  mirtazapine (REMERON) 45 MG tablet Take 45 mg by mouth at bedtime. 08/08/16  Yes [provider]  azithromycin (ZITHROMAX) 250 MG tablet Take 1 a day for 5 days 08/11/16   Don PerkingVeronese, [place], MD  guaiFENesin-codeine 100-10 MG/5ML syrup Take 5 mLs by mouth every 4 (four) hours as needed. Patient not taking: Reported on 08/11/2016 04/18/16   Tommi RumpsSummers, Rhonda L, PA-C  Multiple Vitamin (MULTIVITAMIN WITH MINERALS) TABS tablet Take 1 tablet by mouth daily. Patient not taking: Reported on 08/11/2016 03/17/14   Thermon Leylandavis, Laura A, NP  naproxen (NAPROSYN) 500 MG tablet Take 1 tablet (500 mg total) by mouth 2 (two) times daily with a meal. Patient not taking: Reported on 08/11/2016 06/19/16   Joni ReiningSmith, Ronald K, PA-C  sulfamethoxazole-trimethoprim (BACTRIM DS,SEPTRA DS) 800-160 MG tablet Take 1 tablet by mouth 2 (two) times daily. Patient not taking: Reported on 08/11/2016 06/19/16   Joni ReiningSmith, Ronald K, PA-C    Allergies Penicillins; Wellbutrin [bupropion]; and Ibuprofen  Family History  Problem Relation Age of Onset  . Drug abuse Father   . Prostate cancer Paternal Grandfather   . Bladder Cancer Neg Hx   . Kidney cancer Neg Hx     Social History Social  History  Substance Use Topics  . Smoking status: Current Every Day Smoker    Packs/day: 1.00    Years: 33.00    Types: Cigarettes  . Smokeless tobacco: Never Used  . Alcohol use 1.2 oz/week    2 Standard drinks or equivalent per week    Review of Systems  Constitutional: Negative for fever. + chills and body aches Eyes: Negative for visual changes. ENT: + sore throat. Neck: No neck pain    Cardiovascular: Negative for chest pain. Respiratory: Negative for shortness of breath. Gastrointestinal: Negative for abdominal pain, vomiting. + diarrhea. Genitourinary: Negative for dysuria. Musculoskeletal: Negative for back pain. Skin: Negative for rash. Neurological: Negative for headaches, weakness or numbness. Psych: No SI or HI  ____________________________________________   PHYSICAL EXAM:  VITAL SIGNS: ED Triage Vitals  Enc Vitals Group     BP 08/11/16 1310 (!) 144/98     Pulse Rate 08/11/16 1310 97     Resp 08/11/16 1310 16     Temp 08/11/16 1310 97.8 F (36.6 C)     Temp Source 08/11/16 1310 Oral     SpO2 08/11/16 1310 97 %     Weight 08/11/16 1311 185 lb (83.9 kg)     Height 08/11/16 1311 5\' 10"  (1.778 m)     Head Circumference --      Peak Flow --      Pain Score 08/11/16 1310 8     Pain Loc --      Pain Edu? --      Excl. in GC? --     Constitutional: Alert and oriented. Well appearing and in no apparent distress. HEENT:      Head: Normocephalic and atraumatic.         Eyes: Conjunctivae are normal. Sclera is non-icteric.       Mouth/Throat: Mucous membranes are moist. Tonsils look swollen and erythematous with exudate, no evidence of peritonsillar abscess      Neck: Supple with no signs of meningismus. Cardiovascular: Regular rate and rhythm. No murmurs, gallops, or rubs. 2+ symmetrical distal pulses are present in all extremities. No JVD. Respiratory: Normal respiratory effort. Lungs are clear to auscultation bilaterally. No wheezes, crackles, or rhonchi.  Gastrointestinal: Soft, non tender, and non distended with positive bowel sounds. No rebound or guarding. Genitourinary: No CVA tenderness. Musculoskeletal: Nontender with normal range of motion in all extremities. No edema, cyanosis, or erythema of extremities. Neurologic: Normal speech and language. Face is symmetric. Moving all extremities. No gross focal neurologic deficits are appreciated. Skin:  Skin is warm, dry and intact. No rash noted. Psychiatric: Mood and affect are normal. Speech and behavior are normal.  ____________________________________________   LABS (all labs ordered are listed, but only abnormal results are displayed)  Labs Reviewed  CBC WITH DIFFERENTIAL/PLATELET - Abnormal; Notable for the following:       Result Value   WBC 25.5 (*)    RDW 15.2 (*)    Neutro Abs 21.9 (*)    Monocytes Absolute 1.6 (*)    All other components within normal limits  COMPREHENSIVE METABOLIC PANEL - Abnormal; Notable for the following:    Glucose, Bld 108 (*)    Creatinine, Ser 1.34 (*)    ALT 14 (*)    All other components within normal limits  POCT RAPID STREP A - Abnormal; Notable for the following:    Streptococcus, Group A Screen (Direct) POSITIVE (*)    All other components within normal limits  CULTURE, GROUP A  STREP [facility])  SALICYLATE LEVEL  ACETAMINOPHEN LEVEL  LIPASE, BLOOD  TROPONIN I  ACETAMINOPHEN LEVEL   ____________________________________________  EKG  ED ECG REPORT I, Nita Sickle, the attending physician, personally viewed and interpreted this ECG.  Normal sinus rhythm, rate of 81, normal intervals, normal axis, no ST elevations or depressions. Normal EKG. ____________________________________________  RADIOLOGY  CXR: Negative ____________________________________________   PROCEDURES  Procedure(s) performed: None Procedures Critical Care performed:  None ____________________________________________   INITIAL IMPRESSION / ASSESSMENT AND PLAN / ED COURSE   46 y.o. male with no significant past medical history who presents for evaluation of body aches, sore throat, cough, sore throat x 1 day. Patient took 24 x 500mg  of tylenol in the last 24 hours. Last ingestion was 9AM today. Patient has swollen, erythematous tonsils with exudates and a positive strep test. He has a white count of 25K, lungs are clear, chest x-ray negative for  pneumonia. Tylenol level of 19. Salicylate level less than 7. Patient given IVF, IV decadron, IV rocephin. Patient will be monitored for 4 hours with repeat tylenol level. If level remains normal, patient will be dc home on azithromycin for strep, close f/u with PCP. I spent more than 10 minutes counseling patient about the dangers of overdosing on Tylenol and recommended that he no longer takes Tylenol for the next month. I explained to him that he cannot take more than 4000 mg a day for 2-3 days at a time. Explained to him that it can cause liver failure which could lead to death or significant morbidity requiring liver transplantation. Luckily at this time Tylenol level was negative and so are his LFTs. Care transferred to Dr. Mayford Knife pending repeat tylenol level.     Pertinent labs & imaging results that were available during my care of the patient were reviewed by me and considered in my medical decision making (see chart for details).    ____________________________________________   FINAL CLINICAL IMPRESSION(S) / ED DIAGNOSES  Final diagnoses:  Strep throat  Accidental acetaminophen overdose, initial encounter      NEW MEDICATIONS STARTED DURING THIS VISIT:  New Prescriptions   AZITHROMYCIN (ZITHROMAX) 250 MG TABLET    Take 1 a day for 5 days     Note:  This document was prepared using Dragon voice recognition software and may include unintentional dictation errors.    Don Perking, [place], MD 08/15/16 [id number]

## 2016-08-11 NOTE — ED Triage Notes (Addendum)
Arrives with c/o cold symptoms, productive cough, body aches x 1 day.  Reports taken 2 tabs of extra strength tylenol every 2-3 hours since yesterday at 1600.  Reports taking 2 tabs at, starting yesterday at : 1600, 1900, 2100, 2330, 0500, 0800, 1000.

## 2016-08-12 LAB — CULTURE, GROUP A STREP (THRC)

## 2016-08-14 NOTE — ED Provider Notes (Signed)
Repeat Tylenol level was negative. Patient is currently improved, stable for outpatient follow-up.   Emily FilbertWilliams, Jonathan E, MD 08/14/16 [phone]

## 2016-09-07 ENCOUNTER — Encounter: Payer: Self-pay | Admitting: Emergency Medicine

## 2016-09-07 ENCOUNTER — Emergency Department
Admission: EM | Admit: 2016-09-07 | Discharge: 2016-09-07 | Disposition: A | Discharge status: Home / Self Care | Payer: Medicaid Other | Attending: Emergency Medicine | Admitting: Emergency Medicine

## 2016-09-07 DIAGNOSIS — I10 Essential (primary) hypertension: Secondary | ICD-10-CM | POA: Insufficient documentation

## 2016-09-07 DIAGNOSIS — F17210 Nicotine dependence, cigarettes, uncomplicated: Secondary | ICD-10-CM | POA: Insufficient documentation

## 2016-09-07 DIAGNOSIS — J069 Acute upper respiratory infection, unspecified: Secondary | ICD-10-CM | POA: Insufficient documentation

## 2016-09-07 DIAGNOSIS — R0602 Shortness of breath: Secondary | ICD-10-CM | POA: Diagnosis present

## 2016-09-07 DIAGNOSIS — Z79899 Other long term (current) drug therapy: Secondary | ICD-10-CM | POA: Insufficient documentation

## 2016-09-07 DIAGNOSIS — F17200 Nicotine dependence, unspecified, uncomplicated: Secondary | ICD-10-CM

## 2016-09-07 MED ORDER — GUAIFENESIN-CODEINE 100-10 MG/5ML PO SOLN: 5.0000 mL | ORAL | 0 refills | Status: DC | PRN

## 2016-09-07 NOTE — ED Triage Notes (Signed)
Pt with productive cough, congestion, body aches x 3 days. Denies fever.

## 2016-09-07 NOTE — Discharge Instructions (Signed)
Follow-up with Dr. Allena KatzPatel if any continued problems. Increase fluids. Tylenol or ibuprofen as needed for aches, headache, fever. Continue taking Sudafed for congestion. Take guaifenesin/codeine as needed for cough. Obtain saline nose spray and use as needed for nasal congestion.

## 2016-09-07 NOTE — ED Provider Notes (Signed)
[facility] Emergency Department Provider Note   ____________________________________________   First MD Initiated Contact with Patient 09/07/16 1125     (approximate)  I have reviewed the triage vital signs and the nursing notes.   HISTORY  Chief Complaint Shortness of Breath    HPI Manuel Harmon is a 46 y.o. male is here with complaint of cough and congestion for 3 days. Patient states he has had a lot of nasal congestion and cannot breathe through his nose. He has been coughing all night and is unable sleep. He has been taking over-the-counter medication for congestion without any relief. He denies any fever or chills. He continues to smoke one half pack cigarettes per day. Patient rates his pain as a 9/10.   Past Medical History:  Diagnosis Date  . Anxiety and depression   . Diverticulosis of colon   . History of diverticulitis of colon   . Hypertension   . UTI (lower urinary tract infection)     Patient Active Problem List   Diagnosis Date Noted  . BP (high blood pressure) 03/23/2015  . Current tobacco use 03/23/2015  . Depression 03/13/2014  . 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 Anxiety disorder 03/13/2014  . PTSD (post-traumatic stress disorder) 03/13/2014  . Cocaine use disorder, mild, abuse 03/13/2014  . Cannabis use disorder, moderate, dependence (HCC) 03/13/2014  . Suture reaction 01/15/2014    Past Surgical History:  Procedure Laterality Date  . LEFT COLECTOMY AND APPENDECTOMY  05-31-2007  . RIGHT ORCHIOPEXY INGUINAL APPROACH AND HYDROCELECTOMY  1989  . SCAR REVISION N/A 01/15/2014   Procedure: SCAR REVISION AND REMOVAL OF SUTURE MATERIAL FROM ABDOMINAL WALL;  Surgeon: Darnell Level, MD;  Location: [facility];  Service: General;  Laterality: N/A;    Prior to Admission medications   Medication Sig Start Date End Date Taking? Authorizing Provider  clonazePAM  (KLONOPIN) 1 MG tablet Take 1 mg by mouth 2 (two) times daily.    [provider]  guaiFENesin-codeine 100-10 MG/5ML syrup Take 5 mLs by mouth every 4 (four) hours as needed. 09/07/16   Tommi Rumps, PA-C  lisinopril (PRINIVIL,ZESTRIL) 20 MG tablet Take 1 tablet (20 mg total) by mouth every morning. 03/17/14   Thermon Leyland, NP  mirtazapine (REMERON) 45 MG tablet Take 45 mg by mouth at bedtime. 08/08/16   [provider]    Allergies Penicillins; Wellbutrin [bupropion]; and Ibuprofen  Family History  Problem Relation Age of Onset  . Drug abuse Father   . Prostate cancer Paternal Grandfather   . Bladder Cancer Neg Hx   . Kidney cancer Neg Hx     Social History Social History  Substance Use Topics  . Smoking status: Current Every Day Smoker    Packs/day: 1.00    Years: 33.00    Types: Cigarettes  . Smokeless tobacco: Never Used  . Alcohol use 1.2 oz/week    2 Standard drinks or equivalent per week    Review of Systems Constitutional: No fever/chills Eyes: No visual changes. ENT: No sore throat.Positive nasal congestion. Cardiovascular: Denies chest pain. Respiratory: Denies shortness of breath. Musculoskeletal: Negative for back pain. Skin: Negative for rash. Neurological: Negative for headaches   ____________________________________________   PHYSICAL EXAM:  VITAL SIGNS: ED Triage Vitals  Enc Vitals Group     BP 09/07/16 1103 (!) 153/90     Pulse Rate 09/07/16 1103 80     Resp 09/07/16 1103 16  Temp 09/07/16 1103 97.5 F (36.4 C)     Temp Source 09/07/16 1103 Oral     SpO2 09/07/16 1103 100 %     Weight 09/07/16 1119 190 lb (86.2 kg)     Height 09/07/16 1119 5\' 10"  (1.778 m)     Head Circumference --      Peak Flow --      Pain Score 09/07/16 1118 9     Pain Loc --      Pain Edu? --      Excl. in GC? --     Constitutional: Alert and oriented. Well appearing and in no acute distress. Eyes: Conjunctivae are normal.  Head:  Atraumatic. Nose: Moderate congestion/rhinnorhea. EACs are clear. TMs are dull bilaterally. No erythema or injection noted. Mouth/Throat: Mucous membranes are moist.  Oropharynx non-erythematous. Positive posterior drainage. Neck: No stridor.   Hematological/Lymphatic/Immunilogical: No cervical lymphadenopathy. Cardiovascular: Normal rate, regular rhythm. Grossly normal heart sounds.  Good peripheral circulation. Respiratory: Normal respiratory effort.  No retractions. Lungs CTAB  +congested cough. Musculoskeletal: No lower extremity tenderness nor edema.  No joint effusions. Neurologic:  Normal speech and language. No gross focal neurologic deficits are appreciated. No gait instability. Skin:  Skin is warm, dry and intact. No rash noted. Psychiatric: Mood and affect are normal. Speech and behavior are normal.  ____________________________________________   LABS (all labs ordered are listed, but only abnormal results are displayed)  Labs Reviewed - No data to display  PROCEDURES  Procedure(s) performed: None  Procedures  Critical Care performed: No  ____________________________________________   INITIAL IMPRESSION / ASSESSMENT AND PLAN / ED COURSE  Pertinent labs & imaging results that were available during my care of the patient were reviewed by me and considered in my medical decision making (see chart for details).   Patient was encouraged to follow-up with Dr. Allena KatzPatel if any continued problems. He is to take Tylenol or ibuprofen if needed for aches or fever. He is to continue taking Sudafed for congestion. He is given a prescription for Robitussin AC to take as needed for cough. He is also encouraged to obtain saline nose spray to help with his nasal congestion.     ____________________________________________   FINAL CLINICAL IMPRESSION(S) / ED DIAGNOSES  Final diagnoses:  Acute upper respiratory infection  Current every day smoker      NEW MEDICATIONS STARTED  DURING THIS VISIT:  Discharge Medication List as of 09/07/2016 11:42 AM       Note:  This document was prepared using Dragon voice recognition software and may include unintentional dictation errors.    Tommi RumpsSummers, Jamail Cullers L, PA-C 09/07/16 1316    Phineas SemenGoodman, Graydon, MD 09/07/16 [phone]

## 2017-04-07 ENCOUNTER — Emergency Department
Admission: EM | Admit: 2017-04-07 | Discharge: 2017-04-07 | Disposition: A | Discharge status: Home / Self Care | Payer: Medicaid Other | Attending: Emergency Medicine | Admitting: Emergency Medicine

## 2017-04-07 ENCOUNTER — Encounter: Payer: Self-pay | Admitting: Intensive Care

## 2017-04-07 DIAGNOSIS — J111 Influenza due to unidentified influenza virus with other respiratory manifestations: Secondary | ICD-10-CM | POA: Diagnosis not present

## 2017-04-07 DIAGNOSIS — Z79899 Other long term (current) drug therapy: Secondary | ICD-10-CM | POA: Diagnosis not present

## 2017-04-07 DIAGNOSIS — J028 Acute pharyngitis due to other specified organisms: Secondary | ICD-10-CM

## 2017-04-07 DIAGNOSIS — R6883 Chills (without fever): Secondary | ICD-10-CM | POA: Insufficient documentation

## 2017-04-07 DIAGNOSIS — F17210 Nicotine dependence, cigarettes, uncomplicated: Secondary | ICD-10-CM | POA: Insufficient documentation

## 2017-04-07 DIAGNOSIS — I10 Essential (primary) hypertension: Secondary | ICD-10-CM | POA: Diagnosis not present

## 2017-04-07 DIAGNOSIS — J029 Acute pharyngitis, unspecified: Secondary | ICD-10-CM | POA: Diagnosis present

## 2017-04-07 DIAGNOSIS — M7918 Myalgia, other site: Secondary | ICD-10-CM | POA: Insufficient documentation

## 2017-04-07 DIAGNOSIS — B9789 Other viral agents as the cause of diseases classified elsewhere: Secondary | ICD-10-CM

## 2017-04-07 DIAGNOSIS — R05 Cough: Secondary | ICD-10-CM | POA: Diagnosis not present

## 2017-04-07 DIAGNOSIS — R69 Illness, unspecified: Secondary | ICD-10-CM

## 2017-04-07 LAB — INFLUENZA PANEL BY PCR (TYPE A & B)
INFLAPCR: NEGATIVE
Influenza B By PCR: NEGATIVE

## 2017-04-07 LAB — GROUP A STREP BY PCR: Group A Strep by PCR: NOT DETECTED

## 2017-04-07 MED ORDER — ACETAMINOPHEN 325 MG PO TABS
650.0000 mg | ORAL_TABLET | Freq: Once | ORAL | Status: AC
  Administered 2017-04-07: 650 mg via ORAL
  Filled 2017-04-07: qty 2

## 2017-04-07 MED ORDER — MAGIC MOUTHWASH W/LIDOCAINE: 5.0000 mL | Freq: Four times a day (QID) | ORAL | 0 refills | Status: DC

## 2017-04-07 MED ORDER — TRAMADOL HCL 50 MG PO TABS
50.0000 mg | ORAL_TABLET | Freq: Once | ORAL | Status: AC
  Administered 2017-04-07: 50 mg via ORAL
  Filled 2017-04-07: qty 1

## 2017-04-07 MED ORDER — PSEUDOEPH-BROMPHEN-DM 30-2-10 MG/5ML PO SYRP: 5.0000 mL | ORAL_SOLUTION | Freq: Four times a day (QID) | ORAL | 0 refills | Status: DC | PRN

## 2017-04-07 NOTE — ED Triage Notes (Signed)
Patient c/o body aches, chills/sweats, sore throat and states "I feel horrible"

## 2017-04-07 NOTE — ED Provider Notes (Signed)
[facility] Emergency Department Provider Note   ____________________________________________   First MD Initiated Contact with Patient 04/07/17 0920     (approximate)  I have reviewed the triage vital signs and the nursing notes.   HISTORY  Chief Complaint Influenza    HPI Manuel Harmon is a 47 y.o. male patient complain of body aches, chills/sweats, sore throat and cough for 2 days.  Patient denies nausea, vomiting, or diarrhea.  Patient not taking flu shot for this season.  Patient rates his pain as a 9/10.  Patient describes pain as generalized aching".  No palates measured prior to arrival.  Past Medical History:  Diagnosis Date  . Anxiety and depression   . Diverticulosis of colon   . History of diverticulitis of colon   . Hypertension   . UTI (lower urinary tract infection)     Patient Active Problem List   Diagnosis Date Noted  . BP (high blood pressure) 03/23/2015  . Current tobacco use 03/23/2015  . Depression 03/13/2014  . 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 Anxiety disorder 03/13/2014  . PTSD (post-traumatic stress disorder) 03/13/2014  . Cocaine use disorder, mild, abuse (HCC) 03/13/2014  . Cannabis use disorder, moderate, dependence (HCC) 03/13/2014  . Suture reaction 01/15/2014    Past Surgical History:  Procedure Laterality Date  . LEFT COLECTOMY AND APPENDECTOMY  05-31-2007  . RIGHT ORCHIOPEXY INGUINAL APPROACH AND HYDROCELECTOMY  1989  . SCAR REVISION N/A 01/15/2014   Procedure: SCAR REVISION AND REMOVAL OF SUTURE MATERIAL FROM ABDOMINAL WALL;  Surgeon: Darnell Level, MD;  Location: [facility];  Service: General;  Laterality: N/A;    Prior to Admission medications   Medication Sig Start Date End Date Taking? Authorizing Provider  brompheniramine-pseudoephedrine-DM 30-2-10 MG/5ML syrup Take 5 mLs by mouth 4 (four) times daily as needed. 04/07/17   Joni Reining, PA-C  clonazePAM (KLONOPIN) 1 MG tablet Take 1 mg by mouth 2 (two) times daily.    [provider]  guaiFENesin-codeine 100-10 MG/5ML syrup Take 5 mLs by mouth every 4 (four) hours as needed. 09/07/16   Tommi Rumps, PA-C  lisinopril (PRINIVIL,ZESTRIL) 20 MG tablet Take 1 tablet (20 mg total) by mouth every morning. 03/17/14   Thermon Leyland, NP  magic mouthwash w/lidocaine SOLN Take 5 mLs by mouth 4 (four) times daily. 04/07/17   Joni Reining, PA-C  mirtazapine (REMERON) 45 MG tablet Take 45 mg by mouth at bedtime. 08/08/16   [provider]    Allergies Penicillins; Wellbutrin [bupropion]; and Ibuprofen  Family History  Problem Relation Age of Onset  . Drug abuse Father   . Prostate cancer Paternal Grandfather   . Bladder Cancer Neg Hx   . Kidney cancer Neg Hx     Social History Social History   Tobacco Use  . Smoking status: Current Every Day Smoker    Packs/day: 1.00    Years: 33.00    Pack years: 33.00    Types: Cigarettes  . Smokeless tobacco: Never Used  Substance Use Topics  . Alcohol use: Yes    Alcohol/week: 1.2 oz    Types: 2 Standard drinks or equivalent per week  . Drug use: No    Review of Systems Constitutional: No fever/chills Eyes: No visual changes. ENT: No sore throat. Cardiovascular: Denies chest pain. Respiratory: Denies shortness of breath. Gastrointestinal: No abdominal pain.  No nausea, no vomiting.  No diarrhea.  No constipation.  Genitourinary: Negative for dysuria. Musculoskeletal: Negative for back pain. Skin: Negative for rash. Neurological: Negative for headaches, focal weakness or numbness. Psychiatric:Anxiety, depression, PTSD.  History of cannabis /cocaine usage. Endocrine: Hematological/Lymphatic: Allergic/Immunilogical: See medication list ____________________________________________   PHYSICAL EXAM:  VITAL SIGNS: ED Triage Vitals  Enc Vitals Group     BP 04/07/17 0857 (!) 154/81     Pulse  Rate 04/07/17 0857 99     Resp 04/07/17 0857 20     Temp 04/07/17 0857 98.9 F (37.2 C)     Temp Source 04/07/17 0857 Oral     SpO2 04/07/17 0857 99 %     Weight 04/07/17 0857 185 lb (83.9 kg)     Height 04/07/17 0857 5\' 10"  (1.778 m)     Head Circumference --      Peak Flow --      Pain Score 04/07/17 0908 9     Pain Loc --      Pain Edu? --      Excl. in GC? --    Constitutional: Alert and oriented. Well appearing and in no acute distress. Nose: Edematous nasal turbinates clear rhinorrhea Mouth/Throat: Mucous membranes are moist.  Oropharynx erythematous.  Nonexudative tonsils Neck: No stridor.  Hematological/Lymphatic/Immunilogical: No cervical lymphadenopathy. Cardiovascular: Normal rate, regular rhythm. Grossly normal heart sounds.  Good peripheral circulation. Respiratory: Normal respiratory effort.  No retractions. Lungs CTAB. Neurologic:  Normal speech and language. No gross focal neurologic deficits are appreciated. No gait instability. Skin:  Skin is warm, dry and intact. No rash noted. Psychiatric: Mood and affect are normal. Speech and behavior are normal.  ____________________________________________   LABS (all labs ordered are listed, but only abnormal results are displayed)  Labs Reviewed  GROUP A STREP BY PCR  INFLUENZA PANEL BY PCR (TYPE A & B)   ____________________________________________  EKG EKG read by heart station doctors no acute findings.  ____________________________________________  RADIOLOGY  No results found.  ____________________________________________   PROCEDURES  Procedure(s) performed: None  Procedures  Critical Care performed: No  ____________________________________________   INITIAL IMPRESSION / ASSESSMENT AND PLAN / ED COURSE  As part of my medical decision making, I reviewed the following data within the electronic MEDICAL RECORD NUMBER    Viral illness with pharyngitis.  This negative strep and flu results with  patient.  Patient given discharge care instruction.  Patient advised take medication as directed and follow-up PCP if condition persists.      ____________________________________________   FINAL CLINICAL IMPRESSION(S) / ED DIAGNOSES  Final diagnoses:  Influenza-like illness  Acute viral pharyngitis     ED Discharge Orders        Ordered    magic mouthwash w/lidocaine SOLN  4 times daily    Comments:  Mix 30 mL of Benadryl, 30 mL of nystatin, 30 mL of viscous lidocaine, and 30 ml of Alum&Mag Hydroxide-Simeth for swish and swallow.   04/07/17 1236    brompheniramine-pseudoephedrine-DM 30-2-10 MG/5ML syrup  4 times daily PRN     04/07/17 1236       Note:  This document was prepared using Dragon voice recognition software and may include unintentional dictation errors.    Joni ReiningSmith, Greyson Riccardi K, PA-C 04/07/17 1246    Joni ReiningSmith, Maia Handa K, PA-C 04/07/17 1246    Arnaldo NatalMalinda, Paul F, MD 04/07/17 [phone]

## 2017-04-07 NOTE — ED Notes (Signed)
FIRST NURSE NOTE:  Pt states "I'm weak" pt ambulatory in lobby, placed in wheelchair. Reports having chills and fever last night.  Mask applied in lobby.

## 2017-04-07 NOTE — Discharge Instructions (Signed)
Take extra strength Tylenol for fever and body

## 2017-04-22 ENCOUNTER — Emergency Department: Payer: Medicaid Other

## 2017-04-22 ENCOUNTER — Encounter: Payer: Self-pay | Admitting: Radiology

## 2017-04-22 ENCOUNTER — Other Ambulatory Visit: Payer: Self-pay

## 2017-04-22 ENCOUNTER — Emergency Department
Admission: EM | Admit: 2017-04-22 | Discharge: 2017-04-23 | Disposition: A | Discharge status: Home / Self Care | Payer: Medicaid Other | Attending: Emergency Medicine | Admitting: Emergency Medicine

## 2017-04-22 DIAGNOSIS — Z79899 Other long term (current) drug therapy: Secondary | ICD-10-CM | POA: Insufficient documentation

## 2017-04-22 DIAGNOSIS — I10 Essential (primary) hypertension: Secondary | ICD-10-CM | POA: Insufficient documentation

## 2017-04-22 DIAGNOSIS — F17210 Nicotine dependence, cigarettes, uncomplicated: Secondary | ICD-10-CM | POA: Diagnosis not present

## 2017-04-22 DIAGNOSIS — J029 Acute pharyngitis, unspecified: Secondary | ICD-10-CM | POA: Diagnosis present

## 2017-04-22 LAB — COMPREHENSIVE METABOLIC PANEL
ALK PHOS: 63 U/L (ref 38–126)
ALT: 13 U/L — ABNORMAL LOW (ref 17–63)
ANION GAP: 9 (ref 5–15)
AST: 21 U/L (ref 15–41)
Albumin: 3.8 g/dL (ref 3.5–5.0)
BILIRUBIN TOTAL: 0.3 mg/dL (ref 0.3–1.2)
BUN: 16 mg/dL (ref 6–20)
CALCIUM: 9 mg/dL (ref 8.9–10.3)
CO2: 24 mmol/L (ref 22–32)
Chloride: 105 mmol/L (ref 101–111)
Creatinine, Ser: 1.2 mg/dL (ref 0.61–1.24)
GFR calc non Af Amer: >60 mL/min (ref >60)
GLUCOSE: 140 mg/dL — AB (ref 65–99)
POTASSIUM: 3.8 mmol/L (ref 3.5–5.1)
SODIUM: 138 mmol/L (ref 135–145)
TOTAL PROTEIN: 7.1 g/dL (ref 6.5–8.1)

## 2017-04-22 LAB — CBC WITH DIFFERENTIAL/PLATELET
Basophils Absolute: 0.1 10*3/uL (ref 0–0.1)
Basophils Relative: 1 %
Eosinophils Absolute: 0.4 10*3/uL (ref 0–0.7)
Eosinophils Relative: 6 %
HEMATOCRIT: 42.5 % (ref 40.0–52.0)
HEMOGLOBIN: 14.5 g/dL (ref 13.0–18.0)
LYMPHS PCT: 35 %
Lymphs Abs: 2.4 10*3/uL (ref 1.0–3.6)
MCH: 29.9 pg (ref 26.0–34.0)
MCHC: 34.1 g/dL (ref 32.0–36.0)
MCV: 87.9 fL (ref 80.0–100.0)
Monocytes Absolute: 0.5 10*3/uL (ref 0.2–1.0)
Monocytes Relative: 7 %
NEUTROS ABS: 3.5 10*3/uL (ref 1.4–6.5)
NEUTROS PCT: 51 %
Platelets: 368 10*3/uL (ref 150–440)
RBC: 4.84 MIL/uL (ref 4.40–5.90)
RDW: 14.7 % — ABNORMAL HIGH (ref 11.5–14.5)
WBC: 6.9 10*3/uL (ref 3.8–10.6)

## 2017-04-22 LAB — LACTIC ACID, PLASMA: Lactic Acid, Venous: 0.8 mmol/L (ref 0.5–1.9)

## 2017-04-22 LAB — GROUP A STREP BY PCR: Group A Strep by PCR: NOT DETECTED

## 2017-04-22 MED ORDER — CLINDAMYCIN PHOSPHATE 900 MG/50ML IV SOLN
900.0000 mg | Freq: Once | INTRAVENOUS | Status: AC
Start: 2017-04-22 — End: 2017-04-23
  Administered 2017-04-22: 900 mg via INTRAVENOUS
  Filled 2017-04-22: qty 50

## 2017-04-22 MED ORDER — DEXAMETHASONE SODIUM PHOSPHATE 10 MG/ML IJ SOLN
10.0000 mg | Freq: Once | INTRAMUSCULAR | Status: AC
  Administered 2017-04-22: 10 mg via INTRAVENOUS
  Filled 2017-04-22: qty 1

## 2017-04-22 MED ORDER — SODIUM CHLORIDE 0.9 % IV BOLUS (SEPSIS)
1000.0000 mL | Freq: Once | INTRAVENOUS | Status: AC
Start: 2017-04-22 — End: 2017-04-23
  Administered 2017-04-22: 1000 mL via INTRAVENOUS

## 2017-04-22 MED ORDER — IOPAMIDOL (ISOVUE-300) INJECTION 61%
75.0000 mL | Freq: Once | INTRAVENOUS | Status: AC | PRN
  Administered 2017-04-22: 75 mL via INTRAVENOUS

## 2017-04-22 NOTE — ED Notes (Signed)
Pt reports sore throat intermittently for about 2 weeks; seen here when symptoms started and diagnosed with flu like symptoms; pt says it feels like there is a lump on the right side of his throat

## 2017-04-22 NOTE — ED Notes (Signed)
Pt ambulatory with steady gait to bathroom

## 2017-04-22 NOTE — ED Provider Notes (Signed)
[facility] Emergency Department Provider Note  ____________________________________________  Time seen: Approximately 10:16 PM  I have reviewed the triage vital signs and the nursing notes.   HISTORY  Chief Complaint Sore Throat    HPI Manuel Harmon is a 47 y.o. male who presents the emergency department complaining of increasing right-sided sore throat, pain with swallowing, voice changes.  Patient reports that he had influenza-like symptoms a week ago, was evaluated in the emergency department with negative influenza test and strep throat.  Patient was given symptom control medications.  He reports that other symptoms have improved however his sore throat has worsened.  Patient states that he has noticed that his voice has a hoarse/"sound like mucus is in the back" sound.  Patient is able to control his saliva and denies any difficulty breathing.  Patient reports that there is significant pain with swallowing.  No fevers or chills, neck pain, chest pain, shortness of breath, abdominal pain, nausea or vomiting, diarrhea or constipation.  Patient used to prescribe mouthwash but is not taking any other medications for this complaint.  No other complaints at this time.  Patient has a history of anxiety, diverticulitis, hypertension, depression, PTSD, illicit substance abuse.  Patient denies any complaints with these chronic conditions.  Past Medical History:  Diagnosis Date  . Anxiety and depression   . Diverticulosis of colon   . History of diverticulitis of colon   . Hypertension   . UTI (lower urinary tract infection)     Patient Active Problem List   Diagnosis Date Noted  . BP (high blood pressure) 03/23/2015  . Current tobacco use 03/23/2015  . Depression 03/13/2014  . 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 Anxiety disorder 03/13/2014  . PTSD (post-traumatic stress disorder) 03/13/2014  . Cocaine use  disorder, mild, abuse (HCC) 03/13/2014  . Cannabis use disorder, moderate, dependence (HCC) 03/13/2014  . Suture reaction 01/15/2014    Past Surgical History:  Procedure Laterality Date  . LEFT COLECTOMY AND APPENDECTOMY  05-31-2007  . RIGHT ORCHIOPEXY INGUINAL APPROACH AND HYDROCELECTOMY  1989  . SCAR REVISION N/A 01/15/2014   Procedure: SCAR REVISION AND REMOVAL OF SUTURE MATERIAL FROM ABDOMINAL WALL;  Surgeon: Darnell Levelodd Gerkin, MD;  [facility];  Service: General;  Laterality: N/A;    Prior to Admission medications   Medication Sig Start Date End Date Taking? Authorizing Provider  brompheniramine-pseudoephedrine-DM 30-2-10 MG/5ML syrup Take 5 mLs by mouth 4 (four) times daily as needed. 04/07/17   Joni ReiningSmith, Ronald K, PA-C  clonazePAM (KLONOPIN) 1 MG tablet Take 1 mg by mouth 2 (two) times daily.    [provider]  guaiFENesin-codeine 100-10 MG/5ML syrup Take 5 mLs by mouth every 4 (four) hours as needed. 09/07/16   Tommi RumpsSummers, Rhonda L, PA-C  lisinopril (PRINIVIL,ZESTRIL) 20 MG tablet Take 1 tablet (20 mg total) by mouth every morning. 03/17/14   Thermon Leylandavis, Laura A, NP  magic mouthwash w/lidocaine SOLN Take 5 mLs by mouth 4 (four) times daily. 04/07/17   Joni ReiningSmith, Ronald K, PA-C  mirtazapine (REMERON) 45 MG tablet Take 45 mg by mouth at bedtime. 08/08/16   [provider]    Allergies Penicillins; Wellbutrin [bupropion]; and Ibuprofen  Family History  Problem Relation Age of Onset  . Drug abuse Father   . Prostate cancer Paternal Grandfather   . Bladder Cancer Neg Hx   . Kidney cancer Neg Hx     Social History Social History  Tobacco Use  . Smoking status: Current Every Day Smoker    Packs/day: 1.00    Years: 33.00    Pack years: 33.00    Types: Cigarettes  . Smokeless tobacco: Never Used  Substance Use Topics  . Alcohol use: Yes    Alcohol/week: 1.2 oz    Types: 2 Standard drinks or equivalent per week  . Drug use: No     Review of Systems   Constitutional: No fever/chills Eyes: No visual changes. No discharge ENT: For significant sign of right sore throat with increased pain with swallowing.  Changes in voice. Cardiovascular: no chest pain. Respiratory: no cough. No SOB. Gastrointestinal: No abdominal pain.  No nausea, no vomiting.  No diarrhea.  No constipation. Musculoskeletal: Negative for musculoskeletal pain. Skin: Negative for rash, abrasions, lacerations, ecchymosis. Neurological: Negative for headaches, focal weakness or numbness. 10-point ROS otherwise negative.  ____________________________________________   PHYSICAL EXAM:  VITAL SIGNS: ED Triage Vitals  Enc Vitals Group     BP 04/22/17 2140 140/90     Pulse Rate 04/22/17 2140 95     Resp 04/22/17 2140 18     Temp 04/22/17 2140 98.3 F (36.8 C)     Temp Source 04/22/17 2140 Oral     SpO2 04/22/17 2140 99 %     Weight 04/22/17 2142 185 lb (83.9 kg)     Height 04/22/17 2142 5\' 10"  (1.778 m)     Head Circumference --      Peak Flow --      Pain Score 04/22/17 2141 9     Pain Loc --      Pain Edu? --      Excl. in GC? --      Constitutional: Alert and oriented. Well appearing and in no acute distress. Eyes: Conjunctivae are normal. PERRL. EOMI. Head: Atraumatic. ENT:      Ears: EACs and TMs unremarkable bilaterally.      Nose: No congestion/rhinnorhea.      Mouth/Throat: Mucous membranes are moist.  Oropharynx is erythematous and edematous.  Uvula is deviated to the left.  Patient has  enlarged tonsils, greater on right than left.  No exudates or tonsillar stones identified. Neck: No stridor.  Neck is supple full range of motion Hematological/Lymphatic/Immunilogical: Scattered, nontender mobile anterior cervical lymphadenopathy.  Cardiovascular: Normal rate, regular rhythm. Normal S1 and S2.  Good peripheral circulation. Respiratory: Normal respiratory effort without tachypnea or retractions. Lungs CTAB. Good air entry to the bases with no  decreased or absent breath sounds. Musculoskeletal: Full range of motion to all extremities. No gross deformities appreciated. Neurologic:  Normal speech and language. No gross focal neurologic deficits are appreciated.  Skin:  Skin is warm, dry and intact. No rash noted. Psychiatric: Mood and affect are normal. Speech and behavior are normal. Patient exhibits appropriate insight and judgement.   ____________________________________________   LABS (all labs ordered are listed, but only abnormal results are displayed)  Labs Reviewed  COMPREHENSIVE METABOLIC PANEL - Abnormal; Notable for the following components:      Result Value   Glucose, Bld 140 (*)    ALT 13 (*)    All other components within normal limits  CBC WITH DIFFERENTIAL/PLATELET - Abnormal; Notable for the following components:   RDW 14.7 (*)    All other components within normal limits  GROUP A STREP BY PCR  CULTURE, BLOOD (ROUTINE X 2)  CULTURE, BLOOD (ROUTINE X 2)  LACTIC ACID, PLASMA  LACTIC ACID, PLASMA   ____________________________________________  EKG   ____________________________________________  RADIOLOGY   No results found.  ____________________________________________    PROCEDURES  Procedure(s) performed:    Procedures    Medications  clindamycin (CLEOCIN) IVPB 900 mg (900 mg Intravenous New Bag/Given 04/22/17 2320)  sodium chloride 0.9 % bolus 1,000 mL (1,000 mLs Intravenous New Bag/Given 04/22/17 2316)  dexamethasone (DECADRON) injection 10 mg (10 mg Intravenous Given 04/22/17 2317)     ____________________________________________   INITIAL IMPRESSION / ASSESSMENT AND PLAN / ED COURSE  Pertinent labs & imaging results that were available during my care of the patient were reviewed by me and considered in my medical decision making (see chart for details).  Review of the [place] CSRS was performed in accordance of the [facility] prior to dispensing any controlled drugs.  Clinical Course  as of Apr 22 2340  Wynelle Link Apr 22, 2017  2315 Patient presented to the emergency department complaining of right-sided sore throat, difficulty swallowing, voice changes.  On exam, patient has tonsillar hypertrophy, greater on right than left.  Uvula deviation and hoarse voice is appreciated.  At this time, differential includes tonsillar stone, tonsillitis, pharyngitis, strep, peritonsillar abscess.  At this time, patient will be evaluated with blood work, CT scan for further evaluation of likely peritonsillar abscess.  [JC]    Clinical Course User Index [JC] Sabien Umland, Delorise Royals, PA-C    At this time, the section in the emergency department is closing.  As such, patient care is transferred to attending provider, Dr. Zenda Alpers.  Patient's history, physical exam findings, workup ordered to this point is discussed with provider.  Dr. Zenda Alpers will assume patient care and await results for final diagnosis and disposition.    This chart was dictated using voice recognition software/Dragon. Despite best efforts to proofread, errors can occur which can change the meaning. Any change was purely unintentional.    Racheal Patches, PA-C 04/22/17 2343    Rebecka Apley, MD 04/23/17 [phone]

## 2017-04-22 NOTE — ED Triage Notes (Signed)
Pt states that he was seen last week with flu like symptoms, states that he had  A sore throat then as well pt states that his throat cont to hurt and reports that it is painful to swallow and feels difficult to swallow, pt states that it hurts worse on the rt side

## 2017-04-23 MED ORDER — CLINDAMYCIN HCL 300 MG PO CAPS: 300.0000 mg | ORAL_CAPSULE | Freq: Three times a day (TID) | ORAL | 0 refills | Status: AC

## 2017-04-23 MED ORDER — TRAMADOL HCL 50 MG PO TABS: 50.0000 mg | ORAL_TABLET | Freq: Four times a day (QID) | ORAL | 0 refills | Status: DC | PRN

## 2017-04-23 NOTE — ED Provider Notes (Addendum)
-----------------------------------------   12:37 AM on 04/23/2017 -----------------------------------------   Blood pressure 140/90, pulse 95, temperature 98.3 F (36.8 C), temperature source Oral, resp. rate 18, height 5\' 10"  (1.778 m), weight 83.9 kg (185 lb), SpO2 99 %.  [facility]  In short, Manuel Harmon is a 47 y.o. male with a chief complaint of Sore Throat .  Refer to the original H&P for additional details.  The current plan of care is to follow up the results of the CT scan to determine if the patient has a peritonsillar abscess.  Disposition the patient appropriately once I have the results of the CT scan.   Clinical Course as of Apr 24 35  Wynelle LinkSun Apr 22, 2017  2315 Patient presented to the emergency department complaining of right-sided sore throat, difficulty swallowing, voice changes.  On exam, patient has tonsillar hypertrophy, greater on right than left.  Uvula deviation and hoarse voice is appreciated.  At this time, differential includes tonsillar stone, tonsillitis, pharyngitis, strep, peritonsillar abscess.  At this time, patient will be evaluated with blood work, CT scan for further evaluation of likely peritonsillar abscess.  [JC]    Clinical Course User Index [JC] Cuthriell, Delorise RoyalsJonathan D, PA-C   The patient had a CT soft tissue neck with contrast.  The CT scan showed some tonsillar enlargement and diffuse oropharyngeal mucosal thickening with no abscess identified.  The patient did receive a dose of clindamycin.  The patient already received a dose of dexamethasone and he will be discharged home with some antibiotics for his pharyngitis.   Rebecka ApleyWebster, Voris Tigert P, MD 04/23/17 [id number]    Rebecka ApleyWebster, Haji Delaine P, MD 04/23/17 0040

## 2017-04-23 NOTE — ED Notes (Signed)
Pt in CT at this time.

## 2017-04-23 NOTE — Discharge Instructions (Signed)
It appears that you have pharyngitis. Please follow up with ENT in the event that this worsens and you develop a peritonsillar abscess. Please return with any worsened condition.

## 2017-04-27 LAB — CULTURE, BLOOD (ROUTINE X 2)
CULTURE: NO GROWTH
Culture: NO GROWTH
SPECIAL REQUESTS: ADEQUATE
Special Requests: ADEQUATE

## 2017-06-10 ENCOUNTER — Other Ambulatory Visit: Payer: Self-pay

## 2017-06-10 ENCOUNTER — Emergency Department: Payer: Medicaid Other

## 2017-06-10 ENCOUNTER — Emergency Department
Admission: EM | Admit: 2017-06-10 | Discharge: 2017-06-11 | Disposition: A | Discharge status: Home / Self Care | Payer: Medicaid Other | Attending: Emergency Medicine | Admitting: Emergency Medicine

## 2017-06-10 ENCOUNTER — Encounter: Payer: Self-pay | Admitting: *Deleted

## 2017-06-10 DIAGNOSIS — I10 Essential (primary) hypertension: Secondary | ICD-10-CM | POA: Diagnosis not present

## 2017-06-10 DIAGNOSIS — Z79899 Other long term (current) drug therapy: Secondary | ICD-10-CM | POA: Diagnosis not present

## 2017-06-10 DIAGNOSIS — W01198A Fall on same level from slipping, tripping and stumbling with subsequent striking against other object, initial encounter: Secondary | ICD-10-CM | POA: Insufficient documentation

## 2017-06-10 DIAGNOSIS — Y929 Unspecified place or not applicable: Secondary | ICD-10-CM | POA: Insufficient documentation

## 2017-06-10 DIAGNOSIS — Y999 Unspecified external cause status: Secondary | ICD-10-CM | POA: Diagnosis not present

## 2017-06-10 DIAGNOSIS — F17210 Nicotine dependence, cigarettes, uncomplicated: Secondary | ICD-10-CM | POA: Diagnosis not present

## 2017-06-10 DIAGNOSIS — W19XXXA Unspecified fall, initial encounter: Secondary | ICD-10-CM

## 2017-06-10 DIAGNOSIS — E860 Dehydration: Secondary | ICD-10-CM | POA: Diagnosis not present

## 2017-06-10 DIAGNOSIS — Y939 Activity, unspecified: Secondary | ICD-10-CM | POA: Insufficient documentation

## 2017-06-10 DIAGNOSIS — M79644 Pain in right finger(s): Secondary | ICD-10-CM | POA: Diagnosis not present

## 2017-06-10 DIAGNOSIS — S0990XA Unspecified injury of head, initial encounter: Secondary | ICD-10-CM | POA: Diagnosis not present

## 2017-06-10 LAB — CBC WITH DIFFERENTIAL/PLATELET
BASOS ABS: 0 10*3/uL (ref 0–0.1)
BASOS PCT: 0 %
EOS ABS: 0.2 10*3/uL (ref 0–0.7)
EOS PCT: 2 %
HEMATOCRIT: 49 % (ref 40.0–52.0)
Hemoglobin: 16.3 g/dL (ref 13.0–18.0)
Lymphocytes Relative: 11 %
Lymphs Abs: 0.9 10*3/uL — ABNORMAL LOW (ref 1.0–3.6)
MCH: 29.3 pg (ref 26.0–34.0)
MCHC: 33.2 g/dL (ref 32.0–36.0)
MCV: 88.4 fL (ref 80.0–100.0)
MONO ABS: 0.4 10*3/uL (ref 0.2–1.0)
MONOS PCT: 6 %
NEUTROS ABS: 6.5 10*3/uL (ref 1.4–6.5)
Neutrophils Relative %: 81 %
PLATELETS: 333 10*3/uL (ref 150–440)
RBC: 5.55 MIL/uL (ref 4.40–5.90)
RDW: 15.1 % — AB (ref 11.5–14.5)
WBC: 8 10*3/uL (ref 3.8–10.6)

## 2017-06-10 LAB — BASIC METABOLIC PANEL
ANION GAP: 9 (ref 5–15)
BUN: 19 mg/dL (ref 6–20)
CALCIUM: 9.3 mg/dL (ref 8.9–10.3)
CO2: 25 mmol/L (ref 22–32)
CREATININE: 1.93 mg/dL — AB (ref 0.61–1.24)
Chloride: 104 mmol/L (ref 101–111)
GFR, EST AFRICAN AMERICAN: 47 mL/min — AB (ref >60)
GFR, EST NON AFRICAN AMERICAN: 41 mL/min — AB (ref >60)
Glucose, Bld: 132 mg/dL — ABNORMAL HIGH (ref 65–99)
Potassium: 4.7 mmol/L (ref 3.5–5.1)
SODIUM: 138 mmol/L (ref 135–145)

## 2017-06-10 MED ORDER — PROCHLORPERAZINE EDISYLATE 5 MG/ML IJ SOLN
10.0000 mg | Freq: Once | INTRAMUSCULAR | Status: AC
Start: 2017-06-10 — End: 2017-06-10
  Administered 2017-06-10: 10 mg via INTRAVENOUS
  Filled 2017-06-10: qty 2

## 2017-06-10 MED ORDER — SODIUM CHLORIDE 0.9 % IV BOLUS
1000.0000 mL | Freq: Once | INTRAVENOUS | Status: AC
  Administered 2017-06-10: 1000 mL via INTRAVENOUS

## 2017-06-10 NOTE — Discharge Instructions (Signed)
Please seek medical attention for any high fevers, chest pain, shortness of breath, change in behavior, persistent vomiting, bloody stool or any other new or concerning symptoms.  

## 2017-06-10 NOTE — ED Triage Notes (Signed)
Pt struck back of head on a speaker and passed out.  abrasion back of head.   Pt reports tingling in feet.  Pt alert and talking

## 2017-06-10 NOTE — ED Notes (Signed)
Per family reports pt fell and hit head on speaker and "broke my pinkie"  Pt reports feeling dizzy prior to fall, denies N/V, reports poor PO intake today d/t "ripping and running", reports having these episode about once every 8 months  Pt denies spinal pain   Pain in right pinkie, neck and head (right posterior)

## 2017-06-11 NOTE — ED Provider Notes (Signed)
[facility] Emergency Department Provider Note   ____________________________________________   I have reviewed the triage vital signs and the nursing notes.   HISTORY  Chief Complaint Head Injury   History limited by: Not Limited   HPI Manuel Harmon is a 47 y.o. male who presents to the emergency department today with primary concern for head injury.  The patient was standing up when he started feeling slightly lightheaded and dizzy.  He fell backwards and hit the back of his head on a speaker.  He also thinks that he injured his right pinky in the fall.  After this occurred he continued to be slightly unsteady on his feet.  He states that he has had episodes of lightheadedness and dizziness in the past however has never been evaluated for.  He denies any recent illness.  Denies any chest pain or palpitations during the time of the fall.    Per medical record review patient has a history of HTN.  Past Medical History:  Diagnosis Date  . Anxiety and depression   . Diverticulosis of colon   . History of diverticulitis of colon   . Hypertension   . UTI (lower urinary tract infection)     Patient Active Problem List   Diagnosis Date Noted  . BP (high blood pressure) 03/23/2015  . Current tobacco use 03/23/2015  . Depression 03/13/2014  . 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 Anxiety disorder 03/13/2014  . PTSD (post-traumatic stress disorder) 03/13/2014  . Cocaine use disorder, mild, abuse (HCC) 03/13/2014  . Cannabis use disorder, moderate, dependence (HCC) 03/13/2014  . Suture reaction 01/15/2014    Past Surgical History:  Procedure Laterality Date  . LEFT COLECTOMY AND APPENDECTOMY  05-31-2007  . RIGHT ORCHIOPEXY INGUINAL APPROACH AND HYDROCELECTOMY  1989  . SCAR REVISION N/A 01/15/2014   Procedure: SCAR REVISION AND REMOVAL OF SUTURE MATERIAL FROM ABDOMINAL WALL;  Surgeon: Darnell Level, MD;   Location: [facility];  Service: General;  Laterality: N/A;    Prior to Admission medications   Medication Sig Start Date End Date Taking? Authorizing Provider  brompheniramine-pseudoephedrine-DM 30-2-10 MG/5ML syrup Take 5 mLs by mouth 4 (four) times daily as needed. 04/07/17   Joni Reining, PA-C  clonazePAM (KLONOPIN) 1 MG tablet Take 1 mg by mouth 2 (two) times daily.    [provider]  guaiFENesin-codeine 100-10 MG/5ML syrup Take 5 mLs by mouth every 4 (four) hours as needed. 09/07/16   Tommi Rumps, PA-C  lisinopril (PRINIVIL,ZESTRIL) 20 MG tablet Take 1 tablet (20 mg total) by mouth every morning. 03/17/14   Thermon Leyland, NP  magic mouthwash w/lidocaine SOLN Take 5 mLs by mouth 4 (four) times daily. 04/07/17   Joni Reining, PA-C  mirtazapine (REMERON) 45 MG tablet Take 45 mg by mouth at bedtime. 08/08/16   [provider]  traMADol (ULTRAM) 50 MG tablet Take 1 tablet (50 mg total) by mouth every 6 (six) hours as needed. 04/23/17   Rebecka Apley, MD    Allergies Penicillins; Wellbutrin [bupropion]; and Ibuprofen  Family History  Problem Relation Age of Onset  . Drug abuse Father   . Prostate cancer Paternal Grandfather   . Bladder Cancer Neg Hx   . Kidney cancer Neg Hx     Social History Social History   Tobacco Use  . Smoking status: Current Every Day Smoker    Packs/day: 1.00    Years: 33.00  Pack years: 33.00    Types: Cigarettes  . Smokeless tobacco: Never Used  Substance Use Topics  . Alcohol use: Yes    Alcohol/week: 1.2 oz    Types: 2 Standard drinks or equivalent per week  . Drug use: No    Review of Systems Constitutional: No fever/chills Eyes: No visual changes. ENT: No sore throat. Cardiovascular: Denies chest pain. Respiratory: Denies shortness of breath. Gastrointestinal: No abdominal pain.  No nausea, no vomiting.  No diarrhea.   Genitourinary: Negative for dysuria. Musculoskeletal: Positive for right  upper extremity fifth digit pain. Skin: Negative for rash. Neurological: Positive for headache.  ____________________________________________   PHYSICAL EXAM:  VITAL SIGNS: ED Triage Vitals  Enc Vitals Group     BP 06/10/17 2120 (!) 119/95     Pulse Rate 06/10/17 2120 76     Resp 06/10/17 2120 18     Temp 06/11/17 0055 98.4 F (36.9 C)     Temp Source 06/11/17 0055 Oral     SpO2 06/10/17 2120 100 %     Weight 06/10/17 2128 180 lb (81.6 kg)     Height 06/10/17 2128 5\' 10"  (1.778 m)     Head Circumference --      Peak Flow --      Pain Score 06/10/17 2128 10   Constitutional: Alert and oriented. Well appearing and in no distress. Eyes: Conjunctivae are normal.  ENT   Head: Normocephalic and atraumatic.   Nose: No congestion/rhinnorhea.   Mouth/Throat: Mucous membranes are moist.   Neck: No stridor. Hematological/Lymphatic/Immunilogical: No cervical lymphadenopathy. Cardiovascular: Normal rate, regular rhythm.  No murmurs, rubs, or gallops. Respiratory: Normal respiratory effort without tachypnea nor retractions. Breath sounds are clear and equal bilaterally. No wheezes/rales/rhonchi. Gastrointestinal: Soft and non tender. No rebound. No guarding.  Genitourinary: Deferred Musculoskeletal: No obvious deformity to extremities. Some tenderness to right 5th upper digit.  Neurologic:  Normal speech and language. No gross focal neurologic deficits are appreciated.  Skin:  Small bruise to occiput of scalp.  Psychiatric: Mood and affect are normal. Speech and behavior are normal. Patient exhibits appropriate insight and judgment.  ____________________________________________    LABS (pertinent positives/negatives)  BMP na 138, k 4.7, glu 132, cr 1.93 CBC wbc 8.0, hgb 16.3, plt 333  ____________________________________________   EKG  None  ____________________________________________    RADIOLOGY  CT head No acute injury  Right 5th digit x-ray No  acute osseous injury ____________________________________________   PROCEDURES  Procedures  ____________________________________________   INITIAL IMPRESSION / ASSESSMENT AND PLAN / ED COURSE  Pertinent labs & imaging results that were available during my care of the patient were reviewed by me and considered in my medical decision making (see chart for details).  Patient presented to the emergency department today because of concerns for pain after a fall.  In terms of traumatic injuries x-rays of the right fifth digit the upper extremity and CT head were performed.  The there is these showed acute injury.  Patient did complain of some lightheadedness and dizziness before he fell so blood work was checked to evaluate for anemia, electrolyte abnormalities.  Creatinine was a little bit elevated over patient's baseline suggesting some level of dehydration.  Patient will be given IV fluids.  ____________________________________________   FINAL CLINICAL IMPRESSION(S) / ED DIAGNOSES  Final diagnoses:  Fall, initial encounter  Minor head injury, initial encounter  Dehydration     Note: This dictation was prepared with Dragon dictation. Any transcriptional errors that result from this  process are unintentional     Phineas Semen, MD 06/11/17 1553

## 2017-08-02 ENCOUNTER — Encounter: Payer: Self-pay | Admitting: Emergency Medicine

## 2017-08-02 ENCOUNTER — Other Ambulatory Visit: Payer: Self-pay

## 2017-08-02 ENCOUNTER — Emergency Department
Admission: EM | Admit: 2017-08-02 | Discharge: 2017-08-02 | Disposition: A | Discharge status: Home / Self Care | Payer: Medicaid Other | Attending: Emergency Medicine | Admitting: Emergency Medicine

## 2017-08-02 DIAGNOSIS — I10 Essential (primary) hypertension: Secondary | ICD-10-CM | POA: Diagnosis not present

## 2017-08-02 DIAGNOSIS — Z79899 Other long term (current) drug therapy: Secondary | ICD-10-CM | POA: Insufficient documentation

## 2017-08-02 DIAGNOSIS — J029 Acute pharyngitis, unspecified: Secondary | ICD-10-CM | POA: Diagnosis present

## 2017-08-02 DIAGNOSIS — F17210 Nicotine dependence, cigarettes, uncomplicated: Secondary | ICD-10-CM | POA: Diagnosis not present

## 2017-08-02 DIAGNOSIS — J039 Acute tonsillitis, unspecified: Secondary | ICD-10-CM | POA: Diagnosis not present

## 2017-08-02 MED ORDER — ACETAMINOPHEN 325 MG PO TABS
650.0000 mg | ORAL_TABLET | Freq: Once | ORAL | Status: AC
  Administered 2017-08-02: 650 mg via ORAL
  Filled 2017-08-02: qty 2

## 2017-08-02 MED ORDER — LIDOCAINE VISCOUS HCL 2 % MT SOLN
15.0000 mL | Freq: Once | OROMUCOSAL | Status: AC
  Administered 2017-08-02: 15 mL via OROMUCOSAL
  Filled 2017-08-02: qty 15

## 2017-08-02 MED ORDER — AZITHROMYCIN 500 MG PO TABS
500.0000 mg | ORAL_TABLET | Freq: Once | ORAL | Status: AC
  Administered 2017-08-02: 500 mg via ORAL
  Filled 2017-08-02: qty 1

## 2017-08-02 MED ORDER — AZITHROMYCIN 250 MG PO TABS: ORAL_TABLET | ORAL | 0 refills | Status: DC

## 2017-08-02 NOTE — ED Triage Notes (Signed)
Patient ambulatory to triage with steady gait, without difficulty or distress noted; pt reports sinus drainage x 2 days, awoke with sore throat & diff swallowing

## 2017-08-02 NOTE — ED Provider Notes (Signed)
[facility] Emergency Department Provider Note _________________________________   First MD Initiated Contact with Patient 08/02/17 [phone]     (approximate)  I have reviewed the triage vital signs and the nursing notes.   HISTORY  Chief Complaint Sore Throat   HPI Manuel Harmon is a 47 y.o. male presents to the emergency department with a 2-day history of "sinus drainage sore throat difficulty swallowing secondary to pain.  Patient denies any fever afebrile on presentation.   Past Medical History:  Diagnosis Date  . Anxiety and depression   . Diverticulosis of colon   . History of diverticulitis of colon   . Hypertension   . UTI (lower urinary tract infection)     Patient Active Problem List   Diagnosis Date Noted  . BP (high blood pressure) 03/23/2015  . Current tobacco use 03/23/2015  . Depression 03/13/2014  . 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 Anxiety disorder 03/13/2014  . PTSD (post-traumatic stress disorder) 03/13/2014  . Cocaine use disorder, mild, abuse (HCC) 03/13/2014  . Cannabis use disorder, moderate, dependence (HCC) 03/13/2014  . Suture reaction 01/15/2014    Past Surgical History:  Procedure Laterality Date  . LEFT COLECTOMY AND APPENDECTOMY  05-31-2007  . RIGHT ORCHIOPEXY INGUINAL APPROACH AND HYDROCELECTOMY  1989  . SCAR REVISION N/A 01/15/2014   Procedure: SCAR REVISION AND REMOVAL OF SUTURE MATERIAL FROM ABDOMINAL WALL;  Surgeon: Darnell Level, MD;  Location: [facility];  Service: General;  Laterality: N/A;    Prior to Admission medications   Medication Sig Start Date End Date Taking? Authorizing Provider  brompheniramine-pseudoephedrine-DM 30-2-10 MG/5ML syrup Take 5 mLs by mouth 4 (four) times daily as needed. 04/07/17   Joni Reining, PA-C  clonazePAM (KLONOPIN) 1 MG tablet Take 1 mg by mouth 2 (two) times daily.    [provider]    guaiFENesin-codeine 100-10 MG/5ML syrup Take 5 mLs by mouth every 4 (four) hours as needed. 09/07/16   Tommi Rumps, PA-C  lisinopril (PRINIVIL,ZESTRIL) 20 MG tablet Take 1 tablet (20 mg total) by mouth every morning. 03/17/14   Thermon Leyland, NP  magic mouthwash w/lidocaine SOLN Take 5 mLs by mouth 4 (four) times daily. 04/07/17   Joni Reining, PA-C  mirtazapine (REMERON) 45 MG tablet Take 45 mg by mouth at bedtime. 08/08/16   [provider]  traMADol (ULTRAM) 50 MG tablet Take 1 tablet (50 mg total) by mouth every 6 (six) hours as needed. 04/23/17   Rebecka Apley, MD    Allergies Penicillins; Wellbutrin [bupropion]; and Ibuprofen  Family History  Problem Relation Age of Onset  . Drug abuse Father   . Prostate cancer Paternal Grandfather   . Bladder Cancer Neg Hx   . Kidney cancer Neg Hx     Social History Social History   Tobacco Use  . Smoking status: Current Every Day Smoker    Packs/day: 1.00    Years: 33.00    Pack years: 33.00    Types: Cigarettes  . Smokeless tobacco: Never Used  Substance Use Topics  . Alcohol use: Yes    Alcohol/week: 1.2 oz    Types: 2 Standard drinks or equivalent per week  . Drug use: No    Review of Systems Constitutional: No fever/chills Eyes: No visual changes. ENT: Positive for sore throat.  Positive for nasal congestion cardiovascular: Denies chest pain. Respiratory: Denies shortness of breath. Gastrointestinal: No abdominal pain.  No  nausea, no vomiting.  No diarrhea.  No constipation. Genitourinary: Negative for dysuria. Musculoskeletal: Negative for neck pain.  Negative for back pain. Integumentary: Negative for rash. Neurological: Negative for headaches, focal weakness or numbness.   ____________________________________________   PHYSICAL EXAM:  VITAL SIGNS: ED Triage Vitals  Enc Vitals Group     BP 08/02/17 0551 (!) 146/114     Pulse Rate 08/02/17 0551 96     Resp 08/02/17 0551 18     Temp --       Temp src --      SpO2 08/02/17 0551 96 %     Weight 08/02/17 0548 81.6 kg (180 lb)     Height 08/02/17 0548 1.778 m ( )     Head Circumference --      Peak Flow --      Pain Score 08/02/17 0548 8     Pain Loc --      Pain Edu? --      Excl. in GC? --     Constitutional: Alert and oriented. Well appearing and in no acute distress. Eyes: Conjunctivae are normal.  Head: Atraumatic. Mouth/Throat: Mucous membranes are moist.  Pharyngeal erythema, tonsillar enlargement predominantly on the right Neck: No stridor.   Cardiovascular: Normal rate, regular rhythm. Good peripheral circulation. Grossly normal heart sounds. Respiratory: Normal respiratory effort.  No retractions. Lungs CTAB. Gastrointestinal: Soft and nontender. No distention.  Musculoskeletal: No lower extremity tenderness nor edema. No gross deformities of extremities. Neurologic:  Normal speech and language. No gross focal neurologic deficits are appreciated.  Skin:  Skin is warm, dry and intact. No rash noted. Psychiatric: Mood and affect are normal. Speech and behavior are normal. __________________________________________    Procedures   ____________________________________________   INITIAL IMPRESSION / ASSESSMENT AND PLAN / ED COURSE  As part of my medical decision making, I reviewed the following data within the electronic MEDICAL RECORD NUMBER  47 year old male presenting with above-stated history and physical exam consistent with pharyngitis/tonsillitis.  No evidence of abscess likely.  Reviewed the patient's chart which revealed history of pharyngitis/tonsillitis in January and February of this year.  Patient states that he took antibiotics at that time with resolution of symptoms.  However given recurrence of tonsillitis patient will be prescribed antibiotics with outpatient follow-up with ENT. ____________________________________________  FINAL CLINICAL IMPRESSION(S) / ED DIAGNOSES  Final diagnoses:  Acute  tonsillitis, unspecified etiology     MEDICATIONS GIVEN DURING THIS VISIT:  Medications  azithromycin (ZITHROMAX) tablet 500 mg (has no administration in time range)     ED Discharge Orders    None       Note:  This document was prepared using Dragon voice recognition software and may include unintentional dictation errors.    Darci Current, MD 08/03/17 [phone]

## 2017-08-03 ENCOUNTER — Emergency Department: Payer: Medicaid Other

## 2017-08-03 DIAGNOSIS — Z5321 Procedure and treatment not carried out due to patient leaving prior to being seen by health care provider: Secondary | ICD-10-CM | POA: Diagnosis present

## 2017-08-03 DIAGNOSIS — J392 Other diseases of pharynx: Secondary | ICD-10-CM | POA: Diagnosis present

## 2017-08-03 DIAGNOSIS — Z880 Allergy status to penicillin: Secondary | ICD-10-CM

## 2017-08-03 DIAGNOSIS — J039 Acute tonsillitis, unspecified: Principal | ICD-10-CM | POA: Diagnosis present

## 2017-08-03 DIAGNOSIS — Z813 Family history of other psychoactive substance abuse and dependence: Secondary | ICD-10-CM

## 2017-08-03 DIAGNOSIS — Z79891 Long term (current) use of opiate analgesic: Secondary | ICD-10-CM

## 2017-08-03 DIAGNOSIS — Z79899 Other long term (current) drug therapy: Secondary | ICD-10-CM

## 2017-08-03 DIAGNOSIS — Z888 Allergy status to other drugs, medicaments and biological substances status: Secondary | ICD-10-CM

## 2017-08-03 DIAGNOSIS — F419 Anxiety disorder, unspecified: Secondary | ICD-10-CM | POA: Diagnosis present

## 2017-08-03 DIAGNOSIS — Z8042 Family history of malignant neoplasm of prostate: Secondary | ICD-10-CM

## 2017-08-03 DIAGNOSIS — F17210 Nicotine dependence, cigarettes, uncomplicated: Secondary | ICD-10-CM | POA: Diagnosis present

## 2017-08-03 DIAGNOSIS — Z716 Tobacco abuse counseling: Secondary | ICD-10-CM

## 2017-08-03 DIAGNOSIS — F329 Major depressive disorder, single episode, unspecified: Secondary | ICD-10-CM | POA: Diagnosis present

## 2017-08-03 DIAGNOSIS — I10 Essential (primary) hypertension: Secondary | ICD-10-CM | POA: Diagnosis present

## 2017-08-03 LAB — BASIC METABOLIC PANEL
ANION GAP: 8 (ref 5–15)
BUN: 16 mg/dL (ref 6–20)
CALCIUM: 9.4 mg/dL (ref 8.9–10.3)
CO2: 30 mmol/L (ref 22–32)
Chloride: 101 mmol/L (ref 101–111)
Creatinine, Ser: 1.2 mg/dL (ref 0.61–1.24)
GFR calc non Af Amer: >60 mL/min (ref >60)
Glucose, Bld: 126 mg/dL — ABNORMAL HIGH (ref 65–99)
Potassium: 4 mmol/L (ref 3.5–5.1)
Sodium: 139 mmol/L (ref 135–145)

## 2017-08-03 LAB — CBC
HEMATOCRIT: 43.2 % (ref 40.0–52.0)
Hemoglobin: 14.9 g/dL (ref 13.0–18.0)
MCH: 30.4 pg (ref 26.0–34.0)
MCHC: 34.6 g/dL (ref 32.0–36.0)
MCV: 87.8 fL (ref 80.0–100.0)
PLATELETS: 330 10*3/uL (ref 150–440)
RBC: 4.92 MIL/uL (ref 4.40–5.90)
RDW: 15 % — AB (ref 11.5–14.5)
WBC: 11.1 10*3/uL — AB (ref 3.8–10.6)

## 2017-08-03 MED ORDER — IOPAMIDOL (ISOVUE-370) INJECTION 76%
75.0000 mL | Freq: Once | INTRAVENOUS | Status: AC | PRN
  Administered 2017-08-03: 75 mL via INTRAVENOUS

## 2017-08-03 NOTE — ED Triage Notes (Signed)
Patient was dx in this ED yesterday with Strep throat. Patient is currently unable to open mouth fully, speaking with slurred speech, having problems controlling saliva.

## 2017-08-04 ENCOUNTER — Other Ambulatory Visit: Payer: Self-pay

## 2017-08-04 ENCOUNTER — Inpatient Hospital Stay
Admission: EM | Admit: 2017-08-04 | Discharge: 2017-08-05 | DRG: 153 | Discharge status: Against Medical Advice | Payer: Medicaid Other | Attending: Internal Medicine | Admitting: Internal Medicine

## 2017-08-04 ENCOUNTER — Encounter: Payer: Self-pay | Admitting: Internal Medicine

## 2017-08-04 DIAGNOSIS — Z5321 Procedure and treatment not carried out due to patient leaving prior to being seen by health care provider: Secondary | ICD-10-CM | POA: Diagnosis present

## 2017-08-04 DIAGNOSIS — Z789 Other specified health status: Secondary | ICD-10-CM

## 2017-08-04 DIAGNOSIS — Z79899 Other long term (current) drug therapy: Secondary | ICD-10-CM | POA: Diagnosis not present

## 2017-08-04 DIAGNOSIS — Z888 Allergy status to other drugs, medicaments and biological substances status: Secondary | ICD-10-CM | POA: Diagnosis not present

## 2017-08-04 DIAGNOSIS — Z813 Family history of other psychoactive substance abuse and dependence: Secondary | ICD-10-CM | POA: Diagnosis not present

## 2017-08-04 DIAGNOSIS — Z880 Allergy status to penicillin: Secondary | ICD-10-CM | POA: Diagnosis not present

## 2017-08-04 DIAGNOSIS — F17210 Nicotine dependence, cigarettes, uncomplicated: Secondary | ICD-10-CM | POA: Diagnosis present

## 2017-08-04 DIAGNOSIS — J039 Acute tonsillitis, unspecified: Secondary | ICD-10-CM

## 2017-08-04 DIAGNOSIS — I10 Essential (primary) hypertension: Secondary | ICD-10-CM | POA: Diagnosis present

## 2017-08-04 DIAGNOSIS — F419 Anxiety disorder, unspecified: Secondary | ICD-10-CM | POA: Diagnosis present

## 2017-08-04 DIAGNOSIS — Z8042 Family history of malignant neoplasm of prostate: Secondary | ICD-10-CM | POA: Diagnosis not present

## 2017-08-04 DIAGNOSIS — J029 Acute pharyngitis, unspecified: Secondary | ICD-10-CM | POA: Diagnosis present

## 2017-08-04 DIAGNOSIS — Z716 Tobacco abuse counseling: Secondary | ICD-10-CM | POA: Diagnosis not present

## 2017-08-04 DIAGNOSIS — J392 Other diseases of pharynx: Secondary | ICD-10-CM

## 2017-08-04 DIAGNOSIS — Z79891 Long term (current) use of opiate analgesic: Secondary | ICD-10-CM | POA: Diagnosis not present

## 2017-08-04 DIAGNOSIS — F329 Major depressive disorder, single episode, unspecified: Secondary | ICD-10-CM | POA: Diagnosis present

## 2017-08-04 LAB — GROUP A STREP BY PCR: Group A Strep by PCR: NOT DETECTED

## 2017-08-04 MED ORDER — DEXAMETHASONE SODIUM PHOSPHATE 10 MG/ML IJ SOLN
10.0000 mg | Freq: Once | INTRAMUSCULAR | Status: AC
  Administered 2017-08-04: 10 mg via INTRAVENOUS
  Filled 2017-08-04: qty 1

## 2017-08-04 MED ORDER — SENNOSIDES-DOCUSATE SODIUM 8.6-50 MG PO TABS: 1.0000 | ORAL_TABLET | Freq: Every evening | ORAL | Status: DC | PRN

## 2017-08-04 MED ORDER — ONDANSETRON HCL 4 MG/2ML IJ SOLN: 4.0000 mg | Freq: Four times a day (QID) | INTRAMUSCULAR | Status: DC | PRN

## 2017-08-04 MED ORDER — BISACODYL 5 MG PO TBEC: 5.0000 mg | DELAYED_RELEASE_TABLET | Freq: Every day | ORAL | Status: DC | PRN

## 2017-08-04 MED ORDER — MORPHINE SULFATE (PF) 2 MG/ML IV SOLN
2.0000 mg | INTRAVENOUS | Status: DC | PRN
  Administered 2017-08-04: 2 mg via INTRAVENOUS
  Filled 2017-08-04: qty 1

## 2017-08-04 MED ORDER — ENOXAPARIN SODIUM 40 MG/0.4ML ~~LOC~~ SOLN
40.0000 mg | SUBCUTANEOUS | Status: DC
  Filled 2017-08-04: qty 0.4

## 2017-08-04 MED ORDER — CLONAZEPAM 1 MG PO TABS
1.0000 mg | ORAL_TABLET | Freq: Two times a day (BID) | ORAL | Status: DC
  Administered 2017-08-04 (×3): 1 mg via ORAL
  Filled 2017-08-04 (×3): qty 1

## 2017-08-04 MED ORDER — MORPHINE SULFATE (PF) 4 MG/ML IV SOLN
4.0000 mg | INTRAVENOUS | Status: DC | PRN
  Administered 2017-08-04: 4 mg via INTRAVENOUS
  Filled 2017-08-04: qty 1

## 2017-08-04 MED ORDER — CLINDAMYCIN PHOSPHATE 600 MG/50ML IV SOLN
600.0000 mg | Freq: Three times a day (TID) | INTRAVENOUS | Status: DC
  Administered 2017-08-04 (×2): 600 mg via INTRAVENOUS
  Filled 2017-08-04 (×4): qty 50

## 2017-08-04 MED ORDER — CLINDAMYCIN PHOSPHATE 600 MG/50ML IV SOLN
600.0000 mg | Freq: Once | INTRAVENOUS | Status: AC
  Administered 2017-08-04: 600 mg via INTRAVENOUS
  Filled 2017-08-04: qty 50

## 2017-08-04 MED ORDER — ACETAMINOPHEN 650 MG RE SUPP: 650.0000 mg | Freq: Four times a day (QID) | RECTAL | Status: DC | PRN

## 2017-08-04 MED ORDER — ACETAMINOPHEN 325 MG PO TABS: 650.0000 mg | ORAL_TABLET | Freq: Four times a day (QID) | ORAL | Status: DC | PRN

## 2017-08-04 MED ORDER — MIRTAZAPINE 15 MG PO TABS
45.0000 mg | ORAL_TABLET | Freq: Every day | ORAL | Status: DC
  Filled 2017-08-04: qty 3

## 2017-08-04 MED ORDER — ONDANSETRON HCL 4 MG PO TABS: 4.0000 mg | ORAL_TABLET | Freq: Four times a day (QID) | ORAL | Status: DC | PRN

## 2017-08-04 MED ORDER — NICOTINE 21 MG/24HR TD PT24
21.0000 mg | MEDICATED_PATCH | Freq: Every day | TRANSDERMAL | Status: DC
  Filled 2017-08-04: qty 1

## 2017-08-04 NOTE — H&P (Addendum)
[facility] [place] [facility]   PATIENT NAME: Manuel Harmon    MR#:  [id number]  DATE OF BIRTH:  Oct 18, 1970  DATE OF ADMISSION:  08/04/2017  PRIMARY CARE PHYSICIAN: Hillery Aldo, MD   REQUESTING/REFERRING PHYSICIAN: Loleta Rose, MD  CHIEF COMPLAINT:   Chief Complaint  Patient presents with  . Sore Throat    HISTORY OF PRESENT ILLNESS:  Manuel Harmon  is a 47 y.o. male with a known history of recurrent tonsillopharyngitis who p/w tonsillopharyngitis. This is his fourth recurrence. Was in ED 2d ago, Rx Azithro, D/Ced home. Returns w/ trismus, drooling, PO intolerance. Denies cough/SOB. Stable SpO2 on room air. Endorses neck soreness/pain/tenderness, denies fever/chills/diaphoresis/night sweats/rigors. Endorses difficulty opening mouth, change in phonation. Still has to see PCP to obtain ENT referral for tonsillectomy.  PAST MEDICAL HISTORY:   Past Medical History:  Diagnosis Date  . Anxiety and depression   . Diverticulosis of colon   . History of diverticulitis of colon   . Hypertension   . UTI (lower urinary tract infection)     PAST SURGICAL HISTORY:   Past Surgical History:  Procedure Laterality Date  . LEFT COLECTOMY AND APPENDECTOMY  05-31-2007  . RIGHT ORCHIOPEXY INGUINAL APPROACH AND HYDROCELECTOMY  1989  . SCAR REVISION N/A 01/15/2014   Procedure: SCAR REVISION AND REMOVAL OF SUTURE MATERIAL FROM ABDOMINAL WALL;  Surgeon: Darnell Level, MD;  Location: [facility];  Service: General;  Laterality: N/A;    SOCIAL HISTORY:   Social History   Tobacco Use  . Smoking status: Current Every Day Smoker    Packs/day: 1.00    Years: 33.00    Pack years: 33.00    Types: Cigarettes  . Smokeless tobacco: Never Used  Substance Use Topics  . Alcohol use: Yes    Alcohol/week: 1.2 oz    Types: 2 Standard drinks or equivalent per week    FAMILY HISTORY:   Family History  Problem Relation Age of Onset  . Drug abuse Father   .  Prostate cancer Paternal Grandfather   . Bladder Cancer Neg Hx   . Kidney cancer Neg Hx     DRUG ALLERGIES:   Allergies  Allergen Reactions  . Penicillins   . Wellbutrin [Bupropion] Hives and Other (See Comments)  . Ibuprofen Nausea And Vomiting, Other (See Comments) and Rash    GI Issues "UPSET STOMACH LINING"    REVIEW OF SYSTEMS:   Review of Systems  Constitutional: Negative for chills, diaphoresis, fever, malaise/fatigue and weight loss.  HENT: Negative for congestion, ear pain, hearing loss, nosebleeds, sinus pain, sore throat and tinnitus.   Eyes: Negative for blurred vision, double vision and photophobia.  Respiratory: Negative for cough, hemoptysis, sputum production, shortness of breath and wheezing.   Cardiovascular: Negative for chest pain, palpitations, orthopnea, claudication, leg swelling and PND.  Gastrointestinal: Negative for abdominal pain, blood in stool, constipation, diarrhea, heartburn, melena, nausea and vomiting.  Genitourinary: Negative for dysuria, flank pain, frequency, hematuria and urgency.  Musculoskeletal: Positive for neck pain. Negative for back pain, joint pain and myalgias.  Skin: Negative for itching and rash.  Neurological: Negative for dizziness, tingling, tremors, sensory change, speech change, focal weakness, seizures, loss of consciousness, weakness and headaches.  Psychiatric/Behavioral: Negative for memory loss. The patient does not have insomnia.     MEDICATIONS AT HOME:   Prior to Admission medications   Medication Sig Start Date End Date Taking? Authorizing Provider  azithromycin (ZITHROMAX Z-PAK) 250 MG tablet Take  2 tablets (500 mg) on  Day 1,  followed by 1 tablet (250 mg) once daily on Days 2 through 5. 08/02/17 08/07/17 Yes Darci Current, MD  clonazePAM (KLONOPIN) 1 MG tablet Take 1 mg by mouth 2 (two) times daily.   Yes [provider]  mirtazapine (REMERON) 45 MG tablet Take 45 mg by mouth at bedtime. 08/08/16  Yes  [provider]  brompheniramine-pseudoephedrine-DM 30-2-10 MG/5ML syrup Take 5 mLs by mouth 4 (four) times daily as needed. Patient not taking: Reported on 08/04/2017 04/07/17   Joni Reining, PA-C  guaiFENesin-codeine 100-10 MG/5ML syrup Take 5 mLs by mouth every 4 (four) hours as needed. Patient not taking: Reported on 08/04/2017 09/07/16   Tommi Rumps, PA-C  lisinopril (PRINIVIL,ZESTRIL) 20 MG tablet Take 1 tablet (20 mg total) by mouth every morning. Patient not taking: Reported on 08/04/2017 03/17/14   Thermon Leyland, NP  magic mouthwash w/lidocaine SOLN Take 5 mLs by mouth 4 (four) times daily. Patient not taking: Reported on 08/04/2017 04/07/17   Joni Reining, PA-C  traMADol (ULTRAM) 50 MG tablet Take 1 tablet (50 mg total) by mouth every 6 (six) hours as needed. Patient not taking: Reported on 08/04/2017 04/23/17   Rebecka Apley, MD      VITAL SIGNS:  Blood pressure (!) 147/95, pulse 72, temperature 98 F (36.7 C), temperature source Oral, resp. rate (!) 22, height  (1.778 m), weight 81 kg (178 lb 8 oz), SpO2 100 %.  PHYSICAL EXAMINATION:  Physical Exam  Constitutional: He is oriented to person, place, and time. He appears well-developed and well-nourished. He is active and cooperative.  Non-toxic appearance. He does not have a sickly appearance. He does not appear ill. No distress. He is not intubated.  HENT:  Head: Normocephalic and atraumatic.  Mouth/Throat: Oropharynx is clear and moist and mucous membranes are normal. Mucous membranes are not pale, not dry and not cyanotic. There is trismus in the jaw. No oropharyngeal exudate. No tonsillar exudate.  Eyes: Conjunctivae, EOM and lids are normal. No scleral icterus.  Neck: Normal range of motion. Neck supple. No JVD present. No neck rigidity. No tracheal deviation, no edema, no erythema and normal range of motion present. No thyroid mass and no thyromegaly present.  Cardiovascular: Normal rate, regular  rhythm, S1 normal, S2 normal and normal heart sounds.  No extrasystoles are present. Exam reveals no gallop, no S3, no S4, no distant heart sounds and no friction rub.  No murmur heard. Pulmonary/Chest: Effort normal and breath sounds normal. No accessory muscle usage or stridor. No apnea, no tachypnea and no bradypnea. He is not intubated. No respiratory distress. He has no decreased breath sounds. He has no wheezes. He has no rhonchi. He has no rales.  Abdominal: Soft. Bowel sounds are normal. He exhibits no distension. There is no tenderness. There is no rebound and no guarding.  Musculoskeletal: Normal range of motion. He exhibits no edema.  Lymphadenopathy:    He has no cervical adenopathy.  Neurological: He is alert and oriented to person, place, and time.  Skin: Skin is warm, dry and intact. No rash noted. No erythema.  Psychiatric: He has a normal mood and affect. His speech is normal and behavior is normal. Judgment and thought content normal. Cognition and memory are normal.    LABORATORY PANEL:   CBC Recent Labs  Lab 08/03/17 2200  WBC 11.1*  HGB 14.9  HCT 43.2  PLT 330   ------------------------------------------------------------------------------------------------------------------  Chemistries  Recent Labs  Lab 08/03/17 2200  NA 139  K 4.0  CL 101  CO2 30  GLUCOSE 126*  BUN 16  CREATININE 1.20  CALCIUM 9.4   ------------------------------------------------------------------------------------------------------------------  Cardiac Enzymes No results for input(s): TROPONINI in the last 168 hours. ------------------------------------------------------------------------------------------------------------------  RADIOLOGY:  Ct Soft Tissue Neck W Contrast  Result Date: 08/03/2017 CLINICAL DATA:  Sore throat and stridor. EXAM: CT NECK WITH CONTRAST TECHNIQUE: Multidetector CT imaging of the neck was performed using the standard protocol following the bolus  administration of intravenous contrast. CONTRAST:  75mL ISOVUE-370 IOPAMIDOL (ISOVUE-370) INJECTION 76% COMPARISON:  CT neck 04/23/2017 FINDINGS: PHARYNX AND LARYNX: --Nasopharynx: Markedly enlarged adenoid tonsils. --Oral cavity and oropharynx: Enlarged and edematous palatine tonsils and lingual tonsils but no peritonsillar fluid collection. The oropharyngeal airway is markedly narrowed. --Hypopharynx: Filling of the vallecula by the enlarged lingual tonsils. Piriform sinuses are normal. --Larynx: Normal epiglottis and pre-epiglottic space. Normal aryepiglottic and vocal folds. --Retropharyngeal space: No abscess, effusion or lymphadenopathy. SALIVARY GLANDS: --Parotid: No mass lesion or inflammation. No sialolithiasis or ductal dilatation. --Submandibular: Symmetric without inflammation. No sialolithiasis or ductal dilatation. --Sublingual: Normal. No ranula or other visible lesion of the base of tongue and floor of mouth. THYROID: Normal. LYMPH NODES: Bilateral level 2A cervical lymph nodes. VASCULAR: Major cervical vessels are patent. LIMITED INTRACRANIAL: Normal. VISUALIZED ORBITS: Normal. MASTOIDS AND VISUALIZED PARANASAL SINUSES: No fluid levels or advanced mucosal thickening. No mastoid effusion. SKELETON: No bony spinal canal stenosis. No lytic or blastic lesions. UPPER CHEST: Right apical bullae. OTHER: None. IMPRESSION: 1. Acute tonsillopharyngitis with enlarged adenoid, palatine and lingual tonsils. No peritonsillar or retropharyngeal fluid collection or abscess. The oropharyngeal airway is narrowed. 2. Reactive bilateral level 2A cervical lymphadenopathy. 3. Normal epiglottis. Electronically Signed   By: Deatra Robinson M.D.   On: 08/03/2017 22:59      IMPRESSION AND PLAN:   A/P: 80M recurrent tonsillopharyngitis. -Pcn allergy, Clinda -s/p Decadron -Pain ctrl -No airway compromise -Outpt ENT -PO as tolerated -Home meds as tolerated -Lovenox -Full code -Admission, > 2 midnights   All  the records are reviewed and case discussed with ED provider. Management plans discussed with the patient, family and they are in agreement.  CODE STATUS: Full code  TOTAL TIME TAKING CARE OF THIS PATIENT: 60 minutes.    Barbaraann Rondo M.D on 08/04/2017 at 6:28 AM  Between 7am to 6pm - Pager - [phone]  After 6pm go to [email or web address] - Social research officer, government  [facility]  Office  [phone]  CC: Primary care physician; Hillery Aldo, MD   Note: This dictation was prepared with Dragon dictation along with smaller phrase technology. Any transcriptional errors that result from this process are unintentional.

## 2017-08-04 NOTE — Progress Notes (Signed)
[facility] - [place] at [facility]   PATIENT NAME: Manuel Harmon    MR#:  [id number]  DATE OF BIRTH:  1970/03/28  SUBJECTIVE:   Patient symptomatically improved  REVIEW OF SYSTEMS:    Review of Systems  Constitutional: Negative for fever, chills weight loss HENT: Negative for ear pain, nosebleeds, congestion, facial swelling, rhinorrhea, neck pain, neck stiffness and ear discharge.   Respiratory: Negative for cough, shortness of breath, wheezing  Cardiovascular: Negative for chest pain, palpitations and leg swelling.  Gastrointestinal: Negative for heartburn, abdominal pain, vomiting, diarrhea or consitpation Genitourinary: Negative for dysuria, urgency, frequency, hematuria Musculoskeletal: Negative for back pain or joint pain Neurological: Negative for dizziness, seizures, syncope, focal weakness,  numbness and headaches.  Hematological: Does not bruise/bleed easily.  Psychiatric/Behavioral: Negative for hallucinations, confusion, dysphoric mood    Tolerating Diet: yes      DRUG ALLERGIES:   Allergies  Allergen Reactions  . Penicillins   . Wellbutrin [Bupropion] Hives and Other (See Comments)  . Ibuprofen Nausea And Vomiting, Other (See Comments) and Rash    GI Issues "UPSET STOMACH LINING"    VITALS:  Blood pressure (!) 147/95, pulse 72, temperature 98 F (36.7 C), temperature source Oral, resp. rate (!) 22, height  (1.778 m), weight 81 kg (178 lb 8 oz), SpO2 100 %.  PHYSICAL EXAMINATION:  Constitutional: Appears well-developed and well-nourished. No distress. HENT: Normocephalic. Marland Kitchen Oropharynx is clear and moist.  Tenderness right neck no LAD Eyes: Conjunctivae and EOM are normal. PERRLA, no scleral icterus.  Neck: Normal ROM. Neck supple. No JVD. No tracheal deviation. CVS: RRR, S1/S2 +, no murmurs, no gallops, no carotid bruit.  Pulmonary: Effort and breath sounds normal, no stridor, rhonchi, wheezes, rales.  Abdominal: Soft. BS +,  no  distension, tenderness, rebound or guarding.  Musculoskeletal: Normal range of motion. No edema and no tenderness.  Neuro: Alert. CN 2-12 grossly intact. No focal deficits. Skin: Skin is warm and dry. No rash noted. Psychiatric: Normal mood and affect.      LABORATORY PANEL:   CBC Recent Labs  Lab 08/03/17 2200  WBC 11.1*  HGB 14.9  HCT 43.2  PLT 330   ------------------------------------------------------------------------------------------------------------------  Chemistries  Recent Labs  Lab 08/03/17 2200  NA 139  K 4.0  CL 101  CO2 30  GLUCOSE 126*  BUN 16  CREATININE 1.20  CALCIUM 9.4   ------------------------------------------------------------------------------------------------------------------  Cardiac Enzymes No results for input(s): TROPONINI in the last 168 hours. ------------------------------------------------------------------------------------------------------------------  RADIOLOGY:  Ct Soft Tissue Neck W Contrast  Result Date: 08/03/2017 CLINICAL DATA:  Sore throat and stridor. EXAM: CT NECK WITH CONTRAST TECHNIQUE: Multidetector CT imaging of the neck was performed using the standard protocol following the bolus administration of intravenous contrast. CONTRAST:  75mL ISOVUE-370 IOPAMIDOL (ISOVUE-370) INJECTION 76% COMPARISON:  CT neck 04/23/2017 FINDINGS: PHARYNX AND LARYNX: --Nasopharynx: Markedly enlarged adenoid tonsils. --Oral cavity and oropharynx: Enlarged and edematous palatine tonsils and lingual tonsils but no peritonsillar fluid collection. The oropharyngeal airway is markedly narrowed. --Hypopharynx: Filling of the vallecula by the enlarged lingual tonsils. Piriform sinuses are normal. --Larynx: Normal epiglottis and pre-epiglottic space. Normal aryepiglottic and vocal folds. --Retropharyngeal space: No abscess, effusion or lymphadenopathy. SALIVARY GLANDS: --Parotid: No mass lesion or inflammation. No sialolithiasis or ductal dilatation.  --Submandibular: Symmetric without inflammation. No sialolithiasis or ductal dilatation. --Sublingual: Normal. No ranula or other visible lesion of the base of tongue and floor of mouth. THYROID: Normal. LYMPH NODES: Bilateral level 2A cervical lymph nodes. VASCULAR:  Major cervical vessels are patent. LIMITED INTRACRANIAL: Normal. VISUALIZED ORBITS: Normal. MASTOIDS AND VISUALIZED PARANASAL SINUSES: No fluid levels or advanced mucosal thickening. No mastoid effusion. SKELETON: No bony spinal canal stenosis. No lytic or blastic lesions. UPPER CHEST: Right apical bullae. OTHER: None. IMPRESSION: 1. Acute tonsillopharyngitis with enlarged adenoid, palatine and lingual tonsils. No peritonsillar or retropharyngeal fluid collection or abscess. The oropharyngeal airway is narrowed. 2. Reactive bilateral level 2A cervical lymphadenopathy. 3. Normal epiglottis. Electronically Signed   By: Deatra Robinson M.D.   On: 08/03/2017 22:59     ASSESSMENT AND PLAN:   47 year old male with recurrent pharyngitis who presents to the emergency room due to throat pain.  1.  Pharyngitis without drainable fluid collection or airway distress: Patient is status post flexible fiberoptic laryngoscopy by ENT Continue antibiotics Patient received Decadron in the ER Patient will need ENT follow-up for elective adenotonsillectomy given recurrent nature.  2. Tobacco dependence: Patient is encouraged to quit smoking. Counseling was provided for 4 minutes.  3.  Anxiety: Continue clonazepam  4.  Depression: Continue Remeron  Discussed with ENT  Management plans discussed with the patient and he is in agreement.  CODE STATUS: Full  TOTAL TIME TAKING CARE OF THIS PATIENT: 30 minutes.     POSSIBLE D/C tomorrow, DEPENDING ON CLINICAL CONDITION.   Loraina Stauffer M.D on 08/04/2017 at 11:22 AM  Between 7am to 6pm - Pager - [phone] After 6pm go to [email or web address] - password [email or web address]  [facility]  Office   [phone]  CC: Primary care physician; Hillery Aldo, MD  Note: This dictation was prepared with Dragon dictation along with smaller phrase technology. Any transcriptional errors that result from this process are unintentional.

## 2017-08-04 NOTE — ED Provider Notes (Signed)
[facility] Emergency Department Provider Note  ____________________________________________   First MD Initiated Contact with Patient 08/04/17 0122     (approximate)  I have reviewed the triage vital signs and the nursing notes.   HISTORY  Chief Complaint Sore Throat    HPI Manuel Harmon is a 47 y.o. male with medical history as listed below who also has a history of recurrent pharyngitis who presents for evaluation of gradually worsening and now severe sore throat, difficulty swallowing, etc.  The patient was seen about 2 days ago and diagnosed with tonsillitis.  He was started on azithromycin due to an unspecified allergy to penicillins.  He has taken his medications as recommended but in spite of treatment he is getting worse.  His symptoms are currently severe and he is having difficulty swallowing his own secretions and he says he cannot eat or drink anything.  He has a hoarse voice.  He has difficulty opening his mouth due to the pain.  He is not having any trouble breathing.  He has had some subjective fever.  He denies shortness of breath, nausea, vomiting, and abdominal pain.  Nothing is making his symptoms better and trying to eat or swallow anything makes them worse.  Past Medical History:  Diagnosis Date  . Anxiety and depression   . Diverticulosis of colon   . History of diverticulitis of colon   . Hypertension   . UTI (lower urinary tract infection)     Patient Active Problem List   Diagnosis Date Noted  . BP (high blood pressure) 03/23/2015  . Current tobacco use 03/23/2015  . Depression 03/13/2014  . 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 Anxiety disorder 03/13/2014  . PTSD (post-traumatic stress disorder) 03/13/2014  . Cocaine use disorder, mild, abuse (HCC) 03/13/2014  . Cannabis use disorder, moderate, dependence (HCC) 03/13/2014  . Suture reaction 01/15/2014    Past Surgical  History:  Procedure Laterality Date  . LEFT COLECTOMY AND APPENDECTOMY  05-31-2007  . RIGHT ORCHIOPEXY INGUINAL APPROACH AND HYDROCELECTOMY  1989  . SCAR REVISION N/A 01/15/2014   Procedure: SCAR REVISION AND REMOVAL OF SUTURE MATERIAL FROM ABDOMINAL WALL;  Surgeon: Darnell Level, MD;  Location: [facility];  Service: General;  Laterality: N/A;    Prior to Admission medications   Medication Sig Start Date End Date Taking? Authorizing Provider  azithromycin (ZITHROMAX Z-PAK) 250 MG tablet Take 2 tablets (500 mg) on  Day 1,  followed by 1 tablet (250 mg) once daily on Days 2 through 5. 08/02/17 08/07/17  Darci Current, MD  brompheniramine-pseudoephedrine-DM 30-2-10 MG/5ML syrup Take 5 mLs by mouth 4 (four) times daily as needed. 04/07/17   Joni Reining, PA-C  clonazePAM (KLONOPIN) 1 MG tablet Take 1 mg by mouth 2 (two) times daily.    [provider]  guaiFENesin-codeine 100-10 MG/5ML syrup Take 5 mLs by mouth every 4 (four) hours as needed. 09/07/16   Tommi Rumps, PA-C  lisinopril (PRINIVIL,ZESTRIL) 20 MG tablet Take 1 tablet (20 mg total) by mouth every morning. 03/17/14   Thermon Leyland, NP  magic mouthwash w/lidocaine SOLN Take 5 mLs by mouth 4 (four) times daily. 04/07/17   Joni Reining, PA-C  mirtazapine (REMERON) 45 MG tablet Take 45 mg by mouth at bedtime. 08/08/16   [provider]  traMADol (ULTRAM) 50 MG tablet Take 1 tablet (50 mg total) by mouth every 6 (six) hours as needed. 04/23/17  Rebecka Apley, MD    Allergies Penicillins; Wellbutrin [bupropion]; and Ibuprofen  Family History  Problem Relation Age of Onset  . Drug abuse Father   . Prostate cancer Paternal Grandfather   . Bladder Cancer Neg Hx   . Kidney cancer Neg Hx     Social History Social History   Tobacco Use  . Smoking status: Current Every Day Smoker    Packs/day: 1.00    Years: 33.00    Pack years: 33.00    Types: Cigarettes  . Smokeless tobacco: Never Used    Substance Use Topics  . Alcohol use: Yes    Alcohol/week: 1.2 oz    Types: 2 Standard drinks or equivalent per week  . Drug use: No    Review of Systems Constitutional: Subjective fever Eyes: No visual changes. ENT: Severe sore throat and swelling as described above Cardiovascular: Denies chest pain. Respiratory: Denies shortness of breath. Gastrointestinal: No abdominal pain.  No nausea, no vomiting.  No diarrhea.  No constipation. Genitourinary: Negative for dysuria. Musculoskeletal: Negative for neck pain.  Negative for back pain. Integumentary: Negative for rash. Neurological: Negative for headaches, focal weakness or numbness.   ____________________________________________   PHYSICAL EXAM:  VITAL SIGNS: ED Triage Vitals  Enc Vitals Group     BP 08/03/17 2145 (!) 165/96     Pulse Rate 08/03/17 2145 88     Resp 08/03/17 2145 18     Temp 08/03/17 2145 99.8 F (37.7 C)     Temp Source 08/03/17 2145 Oral     SpO2 08/03/17 2145 99 %     Weight 08/03/17 2143 81.6 kg (180 lb)     Height --      Head Circumference --      Peak Flow --      Pain Score 08/03/17 2144 10     Pain Loc --      Pain Edu? --      Excl. in GC? --     Constitutional: Alert and oriented.  Appears acutely uncomfortable but nontoxic. Eyes: Conjunctivae are normal. PERRL. EOMI. Head: Atraumatic. Ears:  Healthy appearing ear canals and TMs bilaterally Nose: No congestion/rhinnorhea. Mouth/Throat: Mucous membranes are moist.  Oropharynx non-erythematous.  Tonsils are acutely swollen but airway is patent and he is protecting his airway, swallowing secretions, etc. supple to palpation under tongue, no clinical evidence of Ludwig's angina.  Hoarse voice. Neck: No stridor.  No meningeal signs.  No brawny induration of his neck. Cardiovascular: Normal rate, regular rhythm. Good peripheral circulation. Grossly normal heart sounds. Respiratory: Normal respiratory effort.  No retractions. Lungs  CTAB. Gastrointestinal: Soft and nontender. No distention.  Musculoskeletal: No lower extremity tenderness nor edema. No gross deformities of extremities. Neurologic:  Normal speech and language. No gross focal neurologic deficits are appreciated.  Skin:  Skin is warm, dry and intact. No rash noted. Psychiatric: Mood and affect are normal. Speech and behavior are normal.  ____________________________________________   LABS (all labs ordered are listed, but only abnormal results are displayed)  Labs Reviewed  CBC - Abnormal; Notable for the following components:      Result Value   WBC 11.1 (*)    RDW 15.0 (*)    All other components within normal limits  BASIC METABOLIC PANEL - Abnormal; Notable for the following components:   Glucose, Bld 126 (*)    All other components within normal limits  GROUP A STREP BY PCR   ____________________________________________  EKG  None - EKG  not ordered by ED physician ____________________________________________  RADIOLOGY   ED MD interpretation: Acute tonsillopharyngitis without fluid collection/abscess.  Significantly narrowed oropharyngeal airway.  Official radiology report(s): Ct Soft Tissue Neck W Contrast  Result Date: 08/03/2017 CLINICAL DATA:  Sore throat and stridor. EXAM: CT NECK WITH CONTRAST TECHNIQUE: Multidetector CT imaging of the neck was performed using the standard protocol following the bolus administration of intravenous contrast. CONTRAST:  75mL ISOVUE-370 IOPAMIDOL (ISOVUE-370) INJECTION 76% COMPARISON:  CT neck 04/23/2017 FINDINGS: PHARYNX AND LARYNX: --Nasopharynx: Markedly enlarged adenoid tonsils. --Oral cavity and oropharynx: Enlarged and edematous palatine tonsils and lingual tonsils but no peritonsillar fluid collection. The oropharyngeal airway is markedly narrowed. --Hypopharynx: Filling of the vallecula by the enlarged lingual tonsils. Piriform sinuses are normal. --Larynx: Normal epiglottis and pre-epiglottic  space. Normal aryepiglottic and vocal folds. --Retropharyngeal space: No abscess, effusion or lymphadenopathy. SALIVARY GLANDS: --Parotid: No mass lesion or inflammation. No sialolithiasis or ductal dilatation. --Submandibular: Symmetric without inflammation. No sialolithiasis or ductal dilatation. --Sublingual: Normal. No ranula or other visible lesion of the base of tongue and floor of mouth. THYROID: Normal. LYMPH NODES: Bilateral level 2A cervical lymph nodes. VASCULAR: Major cervical vessels are patent. LIMITED INTRACRANIAL: Normal. VISUALIZED ORBITS: Normal. MASTOIDS AND VISUALIZED PARANASAL SINUSES: No fluid levels or advanced mucosal thickening. No mastoid effusion. SKELETON: No bony spinal canal stenosis. No lytic or blastic lesions. UPPER CHEST: Right apical bullae. OTHER: None. IMPRESSION: 1. Acute tonsillopharyngitis with enlarged adenoid, palatine and lingual tonsils. No peritonsillar or retropharyngeal fluid collection or abscess. The oropharyngeal airway is narrowed. 2. Reactive bilateral level 2A cervical lymphadenopathy. 3. Normal epiglottis. Electronically Signed   By: Deatra Robinson M.D.   On: 08/03/2017 22:59    ____________________________________________   PROCEDURES  Critical Care performed: No   Procedure(s) performed:   Procedures   ____________________________________________   INITIAL IMPRESSION / ASSESSMENT AND PLAN / ED COURSE  As part of my medical decision making, I reviewed the following data within the electronic MEDICAL RECORD NUMBER Nursing notes reviewed and incorporated, Labs reviewed , Old chart reviewed and Notes from prior ED visits    Differential diagnosis includes, but is not limited to, acute tonsillopharyngitis, Ludwig's angina, retropharyngeal abscess, peritonsillar abscess, epiglottitis.  The patient has no stridor, is handling his secretions, and is protecting his airway.  However he has been on antibiotics for about 48 hours and has been worsening  in spite of antibiotic treatment.  His airway is significantly narrowed on CT imaging and he has having difficulty swallowing although he is in no respiratory distress.  I am concerned that he is a failure of outpatient antibiotics.  I am treating with Decadron 10 mg IV and clindamycin 600 mg IV; although Unasyn would likely be a preferable treatment, he has a nonspecific allergy to penicillins, and any chance that an antibiotic could lead to edema could be potentially devastating for his airway, so I will treat with clindamycin instead.  I will discuss the case with the hospitalist.  There is no sign of sepsis at this time.  The patient understands and agrees with the plan.  Clinical Course as of Aug 05 550  Sat Aug 04, 2017  0207 I discussed the case with the hospitalist who will admit.  We talked about the plan in detail including antibiotics and Decadron on scheduled doses at least for 24 hours to see if he is improving.  Also passed along from prior conversations with Dr. Jenne Campus the ENT requested that they not be consulted immediately unless  it appears that the patient is worsening or not responding to therapy after 24 hours on antibiotics and steroids.I have, however, paged the current on-call ENT provider, Dr. Gershon Crane, as per the hospitalist request , to give him a heads up and let him know that we are admitting the patient and why including the patient's narrowed airway.   [CF]  0223 I spoke by phone with Dr. Gershon Crane with ENT service.  He agreed that in the absence of any clinical findings of airway deterioration, such as stridor or respiratory distress, there is no indication for an immediate ENT consult at the time.  He agreed with my plan for clindamycin 600 mg IV every 8 hours and Decadron 10 mg IV every 8 hours and reassessment.  I will pass this information along to the hospitalist.  I am also going to give Toradol 50 mg IV   [CF]    Clinical Course User Index [CF] Loleta Rose, MD     ____________________________________________  FINAL CLINICAL IMPRESSION(S) / ED DIAGNOSES  Final diagnoses:  Tonsillopharyngitis  Failure of outpatient treatment  Narrow pharyngeal airway     MEDICATIONS GIVEN DURING THIS VISIT:  Medications  clindamycin (CLEOCIN) IVPB 600 mg (has no administration in time range)  iopamidol (ISOVUE-370) 76 % injection 75 mL (75 mLs Intravenous Contrast Given 08/03/17 2235)  dexamethasone (DECADRON) injection 10 mg (10 mg Intravenous Given 08/04/17 0126)     ED Discharge Orders    None       Note:  This document was prepared using Dragon voice recognition software and may include unintentional dictation errors.    Loleta Rose, MD 08/04/17 [phone]

## 2017-08-04 NOTE — ED Notes (Signed)
Reassessment as patient is asking for pain medicine reveals patient to have hot potato voice. Informed charge of same and bed requested. Patient to be roomed in room 15

## 2017-08-04 NOTE — Consult Note (Signed)
HPI Manuel Harmon is a 47 y.o. male with history of recurrent pharyngitis. Was seen about 2 days ago and diagnosed with tonsillitis.  He was started on azithromycin due to an unspecified allergy to penicillins.  He had progression of symptoms and has been admitted for IV abx. This has occurred roughly 5 times in last 2 years. He is in process of ENT evaluation for tonsillectomy. His currently not having problems breathing. Voice is a bit muffled. Since admission discomfort improving. No exacerbating factors. Denies bleeding or prior throat surgery.      Past Medical History:  Diagnosis Date  . Anxiety and depression   . Diverticulosis of colon   . History of diverticulitis of colon   . Hypertension   . UTI (lower urinary tract infection)         Patient Active Problem List   Diagnosis Date Noted  . BP (high blood pressure) 03/23/2015  . Current tobacco use 03/23/2015  . Depression 03/13/2014  . 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 Anxiety disorder 03/13/2014  . PTSD (post-traumatic stress disorder) 03/13/2014  . Cocaine use disorder, mild, abuse (HCC) 03/13/2014  . Cannabis use disorder, moderate, dependence (HCC) 03/13/2014  . Suture reaction 01/15/2014         Past Surgical History:  Procedure Laterality Date  . LEFT COLECTOMY AND APPENDECTOMY  05-31-2007  . RIGHT ORCHIOPEXY INGUINAL APPROACH AND HYDROCELECTOMY  1989  . SCAR REVISION N/A 01/15/2014   Procedure: SCAR REVISION AND REMOVAL OF SUTURE MATERIAL FROM ABDOMINAL WALL;  Surgeon: Darnell Level, MD;  Location: [facility];  Service: General;  Laterality: N/A;           Prior to Admission medications   Medication Sig Start Date End Date Taking? Authorizing Provider  azithromycin (ZITHROMAX Z-PAK) 250 MG tablet Take 2 tablets (500 mg) on  Day 1,  followed by 1 tablet (250 mg) once daily on Days 2 through 5. 08/02/17 08/07/17  Darci Current,  MD  brompheniramine-pseudoephedrine-DM 30-2-10 MG/5ML syrup Take 5 mLs by mouth 4 (four) times daily as needed. 04/07/17   Joni Reining, PA-C  clonazePAM (KLONOPIN) 1 MG tablet Take 1 mg by mouth 2 (two) times daily.    [provider]  guaiFENesin-codeine 100-10 MG/5ML syrup Take 5 mLs by mouth every 4 (four) hours as needed. 09/07/16   Tommi Rumps, PA-C  lisinopril (PRINIVIL,ZESTRIL) 20 MG tablet Take 1 tablet (20 mg total) by mouth every morning. 03/17/14   Thermon Leyland, NP  magic mouthwash w/lidocaine SOLN Take 5 mLs by mouth 4 (four) times daily. 04/07/17   Joni Reining, PA-C  mirtazapine (REMERON) 45 MG tablet Take 45 mg by mouth at bedtime. 08/08/16   [provider]  traMADol (ULTRAM) 50 MG tablet Take 1 tablet (50 mg total) by mouth every 6 (six) hours as needed. 04/23/17   Rebecka Apley, MD    Allergies Penicillins; Wellbutrin [bupropion]; and Ibuprofen  Family History  Problem Relation Age of Onset  . Drug abuse Father   . Prostate cancer Paternal Grandfather   . Bladder Cancer Neg Hx   . Kidney cancer Neg Hx     Social History Social History        Tobacco Use  . Smoking status: Current Every Day Smoker    Packs/day: 1.00    Years: 33.00    Pack years: 33.00    Types: Cigarettes  . Smokeless tobacco: Never  Used  Substance Use Topics  . Alcohol use: Yes    Alcohol/week: 1.2 oz    Types: 2 Standard drinks or equivalent per week  . Drug use: No    Review of Systems 10 point ROS performed, as per HPI   ____________________________________________   PHYSICAL EXAM:  Temp 99.8  Constitutional: Alert and oriented.  Non toxic Eyes: Conjunctivae are normal. EOMI Head: Atraumatic. Ears:  Pinna wnl Nose: No congestion/rhinnorhea. Mouth/Throat: mucous membranes normal, I do not appreciate any mucosal lesions, large tongue but soft Upper pole of tonsils mildly erythematous, but no lesions.  Palate intact and uvula midline. No trismus Neck: some tenderness palpation right neck, no palpable or firm masses/LAD Respiratory: Normal respiratory effort.  No retractions. No stridor. Voice strong but a bit muffled. No hoarseness Neurologic:  Normal speech and language. No gross focal neurologic deficits are appreciated. CN 2-12 intact Psychiatric: Mood and affect are normal.   ____________________________________________   LABS No significant leukocytosis ____________________________________________  CT IMAGING Reactive bilateral LAD, no drainable collections or masses  ____________________________________________   PROCEDURES  Critical Care performed: No   Procedure(s) performed:   Flexible fiberoptic laryngoscopy- nasal mucosa wnl, NP clear, OP clear with large adenoids, BOT clear, supraglottic and glottic airway patent. Bilateral TVC mobile, airway patent   ASSESSMENT/PLAN 47 yo with history of multiple ED visits for sore throat/URI symptoms, mostly recently twice in last two weeks. CT and exam with evidence of pharyngitis, but no drainable fluid collections or airway distress. His airway is patent on exam and scope. There is no role for surgical intervention. Recommend continued antibiotics and steroids. Agree with HIV testing given recurrent infections. Agree with plans for consideration of elective adenotonsillectomy given recurrent nature of problems.

## 2017-08-05 LAB — HIV ANTIBODY (ROUTINE TESTING W REFLEX): HIV SCREEN 4TH GENERATION: NONREACTIVE

## 2017-08-05 NOTE — Progress Notes (Signed)
Pt was seen leaving off the floor at 0002 by nursing assistant.. Pt was called by primary nurse at 0045 on pt cellular phone  and questioned pt of his whereabouts. Pt stated that he was outside smoking. Pt was informed at that time that he must come back to his room immediately and that he had IV antibiotics that were due. Primary nurse notified security, nursing supervisor, along with [address] of pt leaving. Nursing staff seen pt leave in SUV from parking lot.  Nursing Supervisor informed primary RN that she would have  PD to visit pt address and have pt return to the Hospital if possible to remove IV. Family member with password called to check on the status of the pt at 0316. Family member was informed that pt has left the facility and not returned.

## 2017-08-05 NOTE — Discharge Summary (Signed)
[facility] [place] [facility]   PATIENT NAME: Manuel Harmon    MR#:  [id number]  DATE OF BIRTH:  April 28, 1970  DATE OF ADMISSION:  08/04/2017 ADMITTING PHYSICIAN: Barbaraann Rondo, MD  DATE OF DISCHARGE:08/05/2017  3:30 AM  PRIMARY CARE PHYSICIAN: Hillery Aldo, MD    ADMISSION DIAGNOSIS:  Tonsillopharyngitis [J03.90, J02.9] Failure of outpatient treatment [Z78.9] Narrow pharyngeal airway [J39.2]  DISCHARGE DIAGNOSIS:  Active Problems:   Tonsillopharyngitis   SECONDARY DIAGNOSIS:   Past Medical History:  Diagnosis Date  . Anxiety and depression   . Diverticulosis of colon   . History of diverticulitis of colon   . Hypertension   . UTI (lower urinary tract infection)     HOSPITAL COURSE:  47 year old male with anxiety and depression who was admitted for tonsillitis/pharyngitis and was being treated on IV antibiotics and seen by ENT.  He was seen leaving the floor around midnight on May 26 and never returned.  DISCHARGE CONDITIONS AND DIET:  stable  CONSULTS OBTAINED:  Treatment Team:  Barbaraann Rondo, MD  DRUG ALLERGIES:   Allergies  Allergen Reactions  . Penicillins   . Wellbutrin [Bupropion] Hives and Other (See Comments)  . Ibuprofen Nausea And Vomiting, Other (See Comments) and Rash    GI Issues "UPSET STOMACH LINING"    DISCHARGE MEDICATIONS:   Allergies as of 08/05/2017      Reactions   Penicillins    Wellbutrin [bupropion] Hives, Other (See Comments)   Ibuprofen Nausea And Vomiting, Other (See Comments), Rash   GI Issues "UPSET STOMACH LINING"      Medication List    STOP taking these medications   azithromycin 250 MG tablet Commonly known as:  ZITHROMAX Z-PAK   brompheniramine-pseudoephedrine-DM 30-2-10 MG/5ML syrup   clonazePAM 1 MG tablet Commonly known as:  KLONOPIN   guaiFENesin-codeine 100-10 MG/5ML syrup   lisinopril 20 MG tablet Commonly known as:  PRINIVIL,ZESTRIL   magic mouthwash w/lidocaine Soln    mirtazapine 45 MG tablet Commonly known as:  REMERON   traMADol 50 MG tablet Commonly known as:  ULTRAM        DATA REVIEW:   CBC Recent Labs  Lab 08/03/17 2200  WBC 11.1*  HGB 14.9  HCT 43.2  PLT 330    Chemistries  Recent Labs  Lab 08/03/17 2200  NA 139  K 4.0  CL 101  CO2 30  GLUCOSE 126*  BUN 16  CREATININE 1.20  CALCIUM 9.4    Cardiac Enzymes No results for input(s): TROPONINI in the last 168 hours.  Microbiology Results  @  RADIOLOGY:  Ct Soft Tissue Neck W Contrast  Result Date: 08/03/2017 CLINICAL DATA:  Sore throat and stridor. EXAM: CT NECK WITH CONTRAST TECHNIQUE: Multidetector CT imaging of the neck was performed using the standard protocol following the bolus administration of intravenous contrast. CONTRAST:  75mL ISOVUE-370 IOPAMIDOL (ISOVUE-370) INJECTION 76% COMPARISON:  CT neck 04/23/2017 FINDINGS: PHARYNX AND LARYNX: --Nasopharynx: Markedly enlarged adenoid tonsils. --Oral cavity and oropharynx: Enlarged and edematous palatine tonsils and lingual tonsils but no peritonsillar fluid collection. The oropharyngeal airway is markedly narrowed. --Hypopharynx: Filling of the vallecula by the enlarged lingual tonsils. Piriform sinuses are normal. --Larynx: Normal epiglottis and pre-epiglottic space. Normal aryepiglottic and vocal folds. --Retropharyngeal space: No abscess, effusion or lymphadenopathy. SALIVARY GLANDS: --Parotid: No mass lesion or inflammation. No sialolithiasis or ductal dilatation. --Submandibular: Symmetric without inflammation. No sialolithiasis or ductal dilatation. --Sublingual: Normal. No ranula or other visible lesion of the base of tongue  and floor of mouth. THYROID: Normal. LYMPH NODES: Bilateral level 2A cervical lymph nodes. VASCULAR: Major cervical vessels are patent. LIMITED INTRACRANIAL: Normal. VISUALIZED ORBITS: Normal. MASTOIDS AND VISUALIZED PARANASAL SINUSES: No fluid levels or advanced mucosal thickening. No  mastoid effusion. SKELETON: No bony spinal canal stenosis. No lytic or blastic lesions. UPPER CHEST: Right apical bullae. OTHER: None. IMPRESSION: 1. Acute tonsillopharyngitis with enlarged adenoid, palatine and lingual tonsils. No peritonsillar or retropharyngeal fluid collection or abscess. The oropharyngeal airway is narrowed. 2. Reactive bilateral level 2A cervical lymphadenopathy. 3. Normal epiglottis. Electronically Signed   By: Deatra Robinson M.D.   On: 08/03/2017 22:59      Allergies as of 08/05/2017      Reactions   Penicillins    Wellbutrin [bupropion] Hives, Other (See Comments)   Ibuprofen Nausea And Vomiting, Other (See Comments), Rash   GI Issues "UPSET STOMACH LINING"      Medication List    STOP taking these medications   azithromycin 250 MG tablet Commonly known as:  ZITHROMAX Z-PAK   brompheniramine-pseudoephedrine-DM 30-2-10 MG/5ML syrup   clonazePAM 1 MG tablet Commonly known as:  KLONOPIN   guaiFENesin-codeine 100-10 MG/5ML syrup   lisinopril 20 MG tablet Commonly known as:  PRINIVIL,ZESTRIL   magic mouthwash w/lidocaine Soln   mirtazapine 45 MG tablet Commonly known as:  REMERON   traMADol 50 MG tablet Commonly known as:  ULTRAM         CODE STATUS:     Code Status Orders  (From admission, onward)        Start     Ordered   08/04/17 0308  Full code  Continuous     08/04/17 0307    Code Status History    Date Active Date Inactive Code Status Order ID Comments User Context   03/13/2014 1109 03/17/2014 1502 Full Code [id number]  Adonis Brook May, NP Inpatient       Note: This dictation was prepared with Dragon dictation along with smaller phrase technology. Any transcriptional errors that result from this process are unintentional.  Harjit Leider M.D on 08/05/2017 at 7:31 AM  Between 7am to 6pm - Pager - [phone] After 6pm go to [email or web address] - Social research officer, government  [facility]  Office  [phone]  CC: Primary  care physician; Hillery Aldo, MD

## 2017-08-27 ENCOUNTER — Encounter (HOSPITAL_COMMUNITY): Payer: Self-pay | Admitting: *Deleted

## 2017-08-27 ENCOUNTER — Emergency Department (HOSPITAL_COMMUNITY)
Admission: EM | Admit: 2017-08-27 | Discharge: 2017-08-27 | Disposition: A | Discharge status: Home / Self Care | Payer: Medicaid Other | Attending: Emergency Medicine | Admitting: Emergency Medicine

## 2017-08-27 ENCOUNTER — Emergency Department (HOSPITAL_COMMUNITY): Payer: Medicaid Other

## 2017-08-27 ENCOUNTER — Other Ambulatory Visit: Payer: Self-pay

## 2017-08-27 DIAGNOSIS — R1032 Left lower quadrant pain: Secondary | ICD-10-CM | POA: Diagnosis present

## 2017-08-27 DIAGNOSIS — K5792 Diverticulitis of intestine, part unspecified, without perforation or abscess without bleeding: Secondary | ICD-10-CM | POA: Insufficient documentation

## 2017-08-27 DIAGNOSIS — I10 Essential (primary) hypertension: Secondary | ICD-10-CM | POA: Diagnosis not present

## 2017-08-27 DIAGNOSIS — F17210 Nicotine dependence, cigarettes, uncomplicated: Secondary | ICD-10-CM | POA: Diagnosis not present

## 2017-08-27 LAB — URINALYSIS, ROUTINE W REFLEX MICROSCOPIC
BACTERIA UA: NONE SEEN
BILIRUBIN URINE: NEGATIVE
Glucose, UA: NEGATIVE mg/dL
Ketones, ur: NEGATIVE mg/dL
LEUKOCYTES UA: NEGATIVE
NITRITE: NEGATIVE
PH: 5 (ref 5.0–8.0)
Protein, ur: NEGATIVE mg/dL
SPECIFIC GRAVITY, URINE: 1.021 (ref 1.005–1.030)

## 2017-08-27 LAB — CBC WITH DIFFERENTIAL/PLATELET
Abs Immature Granulocytes: 0 10*3/uL (ref 0.0–0.1)
BASOS ABS: 0.1 10*3/uL (ref 0.0–0.1)
Basophils Relative: 1 %
EOS PCT: 9 %
Eosinophils Absolute: 0.8 10*3/uL — ABNORMAL HIGH (ref 0.0–0.7)
HEMATOCRIT: 47.7 % (ref 39.0–52.0)
Hemoglobin: 14.8 g/dL (ref 13.0–17.0)
Immature Granulocytes: 0 %
Lymphocytes Relative: 23 %
Lymphs Abs: 2.2 10*3/uL (ref 0.7–4.0)
MCH: 28.4 pg (ref 26.0–34.0)
MCHC: 31 g/dL (ref 30.0–36.0)
MCV: 91.4 fL (ref 78.0–100.0)
MONO ABS: 0.7 10*3/uL (ref 0.1–1.0)
Monocytes Relative: 7 %
Neutro Abs: 5.6 10*3/uL (ref 1.7–7.7)
Neutrophils Relative %: 60 %
Platelets: 375 10*3/uL (ref 150–400)
RBC: 5.22 MIL/uL (ref 4.22–5.81)
RDW: 14.4 % (ref 11.5–15.5)
WBC: 9.4 10*3/uL (ref 4.0–10.5)

## 2017-08-27 LAB — COMPREHENSIVE METABOLIC PANEL
ALBUMIN: 3.8 g/dL (ref 3.5–5.0)
ALT: 14 U/L — ABNORMAL LOW (ref 17–63)
AST: 16 U/L (ref 15–41)
Alkaline Phosphatase: 64 U/L (ref 38–126)
Anion gap: 6 (ref 5–15)
BILIRUBIN TOTAL: 0.3 mg/dL (ref 0.3–1.2)
BUN: 9 mg/dL (ref 6–20)
CO2: 28 mmol/L (ref 22–32)
Calcium: 9.5 mg/dL (ref 8.9–10.3)
Chloride: 107 mmol/L (ref 101–111)
Creatinine, Ser: 1.2 mg/dL (ref 0.61–1.24)
GFR calc non Af Amer: >60 mL/min (ref >60)
GLUCOSE: 102 mg/dL — AB (ref 65–99)
POTASSIUM: 4.2 mmol/L (ref 3.5–5.1)
SODIUM: 141 mmol/L (ref 135–145)
TOTAL PROTEIN: 7.1 g/dL (ref 6.5–8.1)

## 2017-08-27 LAB — LIPASE, BLOOD: Lipase: 39 U/L (ref 11–51)

## 2017-08-27 MED ORDER — METRONIDAZOLE 500 MG PO TABS
500.0000 mg | ORAL_TABLET | Freq: Once | ORAL | Status: AC
  Administered 2017-08-27: 500 mg via ORAL
  Filled 2017-08-27: qty 1

## 2017-08-27 MED ORDER — CIPROFLOXACIN HCL 500 MG PO TABS: 500.0000 mg | ORAL_TABLET | Freq: Two times a day (BID) | ORAL | 0 refills | Status: DC

## 2017-08-27 MED ORDER — ONDANSETRON HCL 4 MG/2ML IJ SOLN
4.0000 mg | Freq: Once | INTRAMUSCULAR | Status: AC
  Administered 2017-08-27: 4 mg via INTRAVENOUS
  Filled 2017-08-27: qty 2

## 2017-08-27 MED ORDER — OXYCODONE-ACETAMINOPHEN 5-325 MG PO TABS
1.0000 | ORAL_TABLET | Freq: Once | ORAL | Status: AC
Start: 2017-08-27 — End: 2017-08-27
  Administered 2017-08-27: 1 via ORAL
  Filled 2017-08-27: qty 1

## 2017-08-27 MED ORDER — IOHEXOL 300 MG/ML  SOLN
100.0000 mL | Freq: Once | INTRAMUSCULAR | Status: AC | PRN
  Administered 2017-08-27: 100 mL via INTRAVENOUS

## 2017-08-27 MED ORDER — TRAMADOL HCL 50 MG PO TABS: 50.0000 mg | ORAL_TABLET | Freq: Four times a day (QID) | ORAL | 0 refills | Status: DC | PRN

## 2017-08-27 MED ORDER — METRONIDAZOLE 500 MG PO TABS: 500.0000 mg | ORAL_TABLET | Freq: Three times a day (TID) | ORAL | 0 refills | Status: DC

## 2017-08-27 MED ORDER — CIPROFLOXACIN HCL 500 MG PO TABS
500.0000 mg | ORAL_TABLET | Freq: Once | ORAL | Status: AC
  Administered 2017-08-27: 500 mg via ORAL
  Filled 2017-08-27: qty 1

## 2017-08-27 MED ORDER — HYDROMORPHONE HCL 2 MG/ML IJ SOLN
1.0000 mg | Freq: Once | INTRAMUSCULAR | Status: AC
  Administered 2017-08-27: 1 mg via INTRAVENOUS
  Filled 2017-08-27: qty 1

## 2017-08-27 MED ORDER — OXYCODONE-ACETAMINOPHEN 5-325 MG PO TABS: 1.0000 | ORAL_TABLET | Freq: Four times a day (QID) | ORAL | 0 refills | Status: DC | PRN

## 2017-08-27 NOTE — Discharge Instructions (Addendum)
Please read attached information. If you experience any new or worsening signs or symptoms please return to the emergency room for evaluation. Please follow-up with your primary care provider or specialist as discussed. Please use medication prescribed only as directed and discontinue taking if you have any concerning signs or symptoms.  Please maintain healthy diet with plenty of fluids, easily digestible foods including bananas, rice, and toast.

## 2017-08-27 NOTE — ED Notes (Signed)
Patient transported to CT 

## 2017-08-27 NOTE — ED Triage Notes (Signed)
Pt in c/o abdominal pain that started this morning, pt has history of stomach surgery for diverticulitis several years ago

## 2017-08-27 NOTE — ED Provider Notes (Signed)
Patient placed in Quick Look pathway, seen and evaluated   Chief Complaint: left lower abdominal pain  HPI:   Pt complains of lower abdominal pain.  Pt reports he has had diverticulitis in the past. Pain feels the same  ROS: nausea, blood in stool  Physical Exam:   Gen: No distress  Neuro: Awake and Alert  Skin: Warm    Focused Exam: tender left lower abdoemn    Initiation of care has begun. The patient has been counseled on the process, plan, and necessity for staying for the completion/evaluation, and the remainder of the medical screening examination   Osie CheeksSofia, Isamar Wellbrock K, PA-C 08/27/17 1653    Wynetta FinesMessick, Peter C, MD 08/27/17 [phone]

## 2017-08-27 NOTE — ED Provider Notes (Signed)
[facility] Provider Note   CSN: [id number] Arrival date & time: 08/27/17  1556     History   Chief Complaint Chief Complaint  Patient presents with  . Abdominal Pain    HPI Fayne MediateRalph J Robarts is a 47 y.o. male.  HPI   47 year old male presents today with complaints of bilateral lower abdominal pain x1 day.  Patient notes that he woke up this morning with severe pain in his lower abdomen sharp in nature, radiating back and forth.  Patient denies any fever, nausea or vomiting, denies constipation.  He does note diarrhea that has been ongoing.  He notes this is nonbloody.  Past medical history diverticulitis, he notes this is similar.    Past Medical History:  Diagnosis Date  . Anxiety and depression   . Diverticulosis of colon   . History of diverticulitis of colon   . Hypertension   . UTI (lower urinary tract infection)     Patient Active Problem List   Diagnosis Date Noted  . Tonsillopharyngitis 08/04/2017  . BP (high blood pressure) 03/23/2015  . Current tobacco use 03/23/2015  . Depression 03/13/2014  . 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 Anxiety disorder 03/13/2014  . PTSD (post-traumatic stress disorder) 03/13/2014  . Cocaine use disorder, mild, abuse (HCC) 03/13/2014  . Cannabis use disorder, moderate, dependence (HCC) 03/13/2014  . Suture reaction 01/15/2014    Past Surgical History:  Procedure Laterality Date  . LEFT COLECTOMY AND APPENDECTOMY  05-31-2007  . RIGHT ORCHIOPEXY INGUINAL APPROACH AND HYDROCELECTOMY  1989  . SCAR REVISION N/A 01/15/2014   Procedure: SCAR REVISION AND REMOVAL OF SUTURE MATERIAL FROM ABDOMINAL WALL;  Surgeon: Darnell Levelodd Gerkin, MD;  [facility];  Service: General;  Laterality: N/A;        Home Medications    Prior to Admission medications   Medication Sig Start Date End Date Taking? Authorizing Provider  ciprofloxacin  (CIPRO) 500 MG tablet Take 1 tablet (500 mg total) by mouth 2 (two) times daily. 08/27/17   Latoyna Hird, Tinnie GensJeffrey, PA-C  metroNIDAZOLE (FLAGYL) 500 MG tablet Take 1 tablet (500 mg total) by mouth 3 (three) times daily. 08/27/17   Axyl Sitzman, Tinnie GensJeffrey, PA-C  oxyCODONE-acetaminophen (PERCOCET/ROXICET) 5-325 MG tablet Take 1 tablet by mouth every 6 (six) hours as needed for severe pain. 08/27/17   Eyvonne MechanicHedges, Kainen Struckman, PA-C    Family History Family History  Problem Relation Age of Onset  . Drug abuse Father   . Prostate cancer Paternal Grandfather   . Bladder Cancer Neg Hx   . Kidney cancer Neg Hx     Social History Social History   Tobacco Use  . Smoking status: Current Every Day Smoker    Packs/day: 1.00    Years: 33.00    Pack years: 33.00    Types: Cigarettes  . Smokeless tobacco: Never Used  Substance Use Topics  . Alcohol use: Yes    Alcohol/week: 1.2 oz    Types: 2 Standard drinks or equivalent per week  . Drug use: No     Allergies   Penicillins; Wellbutrin [bupropion]; and Ibuprofen   Review of Systems Review of Systems  All other systems reviewed and are negative.    Physical Exam Updated Vital Signs BP (!) 147/97 (BP Location: Right Arm)   Pulse 85   Temp 98.2 F (36.8 C) (Oral)   Resp 18   SpO2 100%   Physical Exam  Constitutional: He is  oriented to person, place, and time. He appears well-developed and well-nourished.  HENT:  Head: Normocephalic and atraumatic.  Eyes: Pupils are equal, round, and reactive to light. Conjunctivae are normal. Right eye exhibits no discharge. Left eye exhibits no discharge. No scleral icterus.  Neck: Normal range of motion. No JVD present. No tracheal deviation present.  Pulmonary/Chest: Effort normal. No stridor.  Abdominal:  Exquisite tenderness to lower abdomen diffusely-no masses noted no upper abdominal tenderness  Neurological: He is alert and oriented to person, place, and time. Coordination normal.  Psychiatric: He has a normal  mood and affect. His behavior is normal. Judgment and thought content normal.  Nursing note and vitals reviewed.    ED Treatments / Results  Labs (all labs ordered are listed, but only abnormal results are displayed) Labs Reviewed  CBC WITH DIFFERENTIAL/PLATELET - Abnormal; Notable for the following components:      Result Value   Eosinophils Absolute 0.8 (*)    All other components within normal limits  COMPREHENSIVE METABOLIC PANEL - Abnormal; Notable for the following components:   Glucose, Bld 102 (*)    ALT 14 (*)    All other components within normal limits  URINALYSIS, ROUTINE W REFLEX MICROSCOPIC - Abnormal; Notable for the following components:   Hgb urine dipstick SMALL (*)    All other components within normal limits  LIPASE, BLOOD    EKG None  Radiology Ct Abdomen Pelvis W Contrast  Result Date: 08/27/2017 CLINICAL DATA:  47 year old male with abdominal pain since this morning. Personal history of diverticulitis requiring left colectomy. Prior appendectomy. EXAM: CT ABDOMEN AND PELVIS WITH CONTRAST TECHNIQUE: Multidetector CT imaging of the abdomen and pelvis was performed using the standard protocol following bolus administration of intravenous contrast. CONTRAST:  OMNIPAQUE IOHEXOL 300 MG/ML  SOLN COMPARISON:  CT Abdomen and Pelvis 01/09/2014. FINDINGS: Lower chest: Negative lung bases aside from mild atelectasis. No pericardial or pleural effusion. Hepatobiliary: Negative liver and gallbladder. Bile ducts appear stable. Pancreas: Negative. Spleen: Negative. Adrenals/Urinary Tract: Normal adrenal glands. Bilateral renal enhancement and contrast excretion is symmetric and normal. A small exophytic low-density left renal lesion is stable or smaller compared to 2015 and benign. No perinephric stranding. Normal proximal ureters. Diminutive and unremarkable urinary bladder. Stomach/Bowel: Negative rectum. The sigmoid colon is within normal limits. The left colon is  surgically absent, the transverse colon continues to the sigmoid to the left of midline. There is diverticulosis at the hepatic flexure. There is mild circumferential wall thickening in the proximal transverse and distal ascending colon. There is moderate wall thickening throughout the remainder of the right colon and confluent right pericolic gutter inflammation or small volume free fluid (series 3, image 47). In the vicinity of the fluid and enhancing right colonic diverticula is noted on series 6, image 55. No extraluminal gas is identified. The cecum is relatively spared. Appendectomy site appears normal. Negative terminal ileum. No dilated small bowel. Stomach and duodenum are within normal limits. No abdominal free air.  No upper abdominal free fluid. Vascular/Lymphatic: The major arterial structures in the abdomen and pelvis appear patent and normal. The portal venous system is patent. No lymphadenopathy. Reproductive: Negative. Other: No pelvic free fluid. Musculoskeletal: Stable visualized osseous structures. Lower lumbar facet degeneration and degenerated L5-S1 assimilation joints, more so the left. IMPRESSION: 1. Acute Colitis/diverticulitis of the ascending and proximal transverse colon. Associated small volume free fluid in the right pericolic gutter appears to be reactive. There is no extraluminal gas to suggest perforation, and  no organized fluid collection/abscess. 2. No bowel obstruction. No other acute or inflammatory process identified in the abdomen or pelvis. Electronically Signed   By: Odessa Fleming M.D.   On: 08/27/2017 21:58    Procedures Procedures (including critical care time)  Medications Ordered in ED Medications  oxyCODONE-acetaminophen (PERCOCET/ROXICET) 5-325 MG per tablet 1 tablet (has no administration in time range)  ciprofloxacin (CIPRO) tablet 500 mg (has no administration in time range)  metroNIDAZOLE (FLAGYL) tablet 500 mg (has no administration in time range)    HYDROmorphone (DILAUDID) injection 1 mg (1 mg Intravenous Given 08/27/17 1933)  ondansetron (ZOFRAN) injection 4 mg (4 mg Intravenous Given 08/27/17 1932)  iohexol (OMNIPAQUE) 300 MG/ML solution 100 mL (100 mLs Intravenous Contrast Given 08/27/17 2136)     Initial Impression / Assessment and Plan / ED Course  I have reviewed the triage vital signs and the nursing notes.  Pertinent labs & imaging results that were available during my care of the patient were reviewed by me and considered in my medical decision making (see chart for details).     Labs: cbc, cmp  Imaging: CT abdomen pelvis with contrast  Consults:  Therapeutics: Dilaudid, Percocet, Cipro, metronidazole  Discharge Meds: Cassette, Cipro, metronidazole  Assessment/Plan: 47 year old male presents today with unconjugated diverticulitis.  He has no elevation white count, no fever, tolerating p.o.  His pain was easily managed here in the ED.  He will be discharged home with oral antibiotics, pain medicine, primary care follow-up and strict return precautions.  He verbalized understanding and agreement to today's plan had no further questions or concerns at time discharge.   Final Clinical Impressions(s) / ED Diagnoses   Final diagnoses:  Diverticulitis    ED Discharge Orders        Ordered    traMADol (ULTRAM) 50 MG tablet  Every 6 hours PRN,   Status:  Discontinued     08/27/17 2201    oxyCODONE-acetaminophen (PERCOCET/ROXICET) 5-325 MG tablet  Every 6 hours PRN     08/27/17 2210    ciprofloxacin (CIPRO) 500 MG tablet  2 times daily     08/27/17 2210    metroNIDAZOLE (FLAGYL) 500 MG tablet  3 times daily     08/27/17 2210       Eyvonne Mechanic, PA-C 08/27/17 2214    Tilden Fossa, MD 08/28/17 1146

## 2017-09-05 ENCOUNTER — Other Ambulatory Visit: Payer: Self-pay

## 2017-09-05 ENCOUNTER — Encounter: Payer: Self-pay | Admitting: *Deleted

## 2017-09-10 NOTE — Discharge Instructions (Signed)
T & A INSTRUCTION SHEET - [facility] °[facility] ° °CREIGHTON VAUGHT, MD °PAUL H. JUENGEL, MD  °P. SCOTT BENNETT °CHAPMAN MCQUEEN, MD ° °[address] [place], [place] [address] TEL. [phone] °[address] [place] [address] [phone] ° °INFORMATION SHEET FOR A TONSILLECTOMY AND ADENDOIDECTOMY ° °About Your Tonsils and Adenoids ° The tonsils and adenoids are normal body tissues that are part of our immune system.  They normally help to protect us against diseases that may enter our mouth and nose.  However, sometimes the tonsils and/or adenoids become too large and obstruct our breathing, especially at night. °  ° If either of these things happen it helps to remove the tonsils and adenoids in order to become healthier. The operation to remove the tonsils and adenoids is called a tonsillectomy and adenoidectomy. ° °The Location of Your Tonsils and Adenoids ° The tonsils are located in the back of the throat on both side and sit in a cradle of muscles. The adenoids are located in the roof of the mouth, behind the nose, and closely associated with the opening of the Eustachian tube to the ear. ° °Surgery on Tonsils and Adenoids ° A tonsillectomy and adenoidectomy is a short operation which takes about thirty minutes.  This includes being put to sleep and being awakened.  Tonsillectomies and adenoidectomies are performed at [facility] and may require observation period in the recovery room prior to going home. ° °Following the Operation for a Tonsillectomy ° A cautery machine is used to control bleeding.  Bleeding from a tonsillectomy and adenoidectomy is minimal and postoperatively the risk of bleeding is approximately four percent, although this rarely life threatening. ° ° ° °After your tonsillectomy and adenoidectomy post-op care at home: ° °1. Our patients are able to go home the same day.  You may be given prescriptions for pain  medications and antibiotics, if indicated. °2. It is extremely important to remember that fluid intake is of utmost importance after a tonsillectomy.  The amount that you drink must be maintained in the postoperative period.  A good indication of whether a child is getting enough fluid is whether his/her urine output is constant.  As long as children are urinating or wetting their diaper every 6 - 8 hours this is usually enough fluid intake.   °3. Although rare, this is a risk of some bleeding in the first ten days after surgery.  This is usually occurs between day five and nine postoperatively.  This risk of bleeding is approximately four percent.  If you or your child should have any bleeding you should remain calm and notify our office or go directly to the Emergency Room at [facility] where they will contact us. Our doctors are available seven days a week for notification.  We recommend sitting up quietly in a chair, place an ice pack on the front of the neck and spitting out the blood gently until we are able to contact you.  Adults should gargle gently with ice water and this may help stop the bleeding.  If the bleeding does not stop after a short time, i.e. 10 to 15 minutes, or seems to be increasing again, please contact us or go to the hospital.   °4. It is common for the pain to be worse at 5 - 7 days postoperatively.  This occurs because the “scab” is peeling off and the mucous membrane (skin of   the throat) is growing back where the tonsils were.   °5. It is common for a low-grade fever, less than 102, during the first week after a tonsillectomy and adenoidectomy.  It is usually due to not drinking enough liquids, and we suggest your use liquid Tylenol or the pain medicine with Tylenol prescribed in order to keep your temperature below 102.  Please follow the directions on the back of the bottle. °6. Do not take aspirin or any products that contain aspirin such as Bufferin, Anacin,  Ecotrin, aspirin gum, Goodies, BC headache powders, etc., after a T&A because it can promote bleeding.  Please check with our office before administering any other medication that may been prescribed by other doctors during the two week post-operative period. °7. If you happen to look in the mirror or into your child’s mouth you will see white/gray patches on the back of the throat.  This is what a scab looks like in the mouth and is normal after having a T&A.  It will disappear once the tonsil area heals completely. However, it may cause a noticeable odor, and this too will disappear with time.     °8. You or your child may experience ear pain after having a T&A.  This is called referred pain and comes from the throat, but it is felt in the ears.  Ear pain is quite common and expected.  It will usually go away after ten days.  There is usually nothing wrong with the ears, and it is primarily due to the healing area stimulating the nerve to the ear that runs along the side of the throat.  Use either the prescribed pain medicine or Tylenol as needed.  °9. The throat tissues after a tonsillectomy are obviously sensitive.  Smoking around children who have had a tonsillectomy significantly increases the risk of bleeding.  DO NOT SMOKE!  ° °General Anesthesia, Adult, Care After °These instructions provide you with information about caring for yourself after your procedure. Your health care provider may also give you more specific instructions. Your treatment has been planned according to current medical practices, but problems sometimes occur. Call your health care provider if you have any problems or questions after your procedure. °What can I expect after the procedure? °After the procedure, it is common to have: °· Vomiting. °· A sore throat. °· Mental slowness. ° °It is common to feel: °· Nauseous. °· Cold or shivery. °· Sleepy. °· Tired. °· Sore or achy, even in parts of your body where you did not have  surgery. ° °Follow these instructions at home: °For at least 24 hours after the procedure: °· Do not: °? Participate in activities where you could fall or become injured. °? Drive. °? Use heavy machinery. °? Drink alcohol. °? Take sleeping pills or medicines that cause drowsiness. °? Make important decisions or sign legal documents. °? Take care of children on your own. °· Rest. °Eating and drinking °· If you vomit, drink water, juice, or soup when you can drink without vomiting. °· Drink enough fluid to keep your urine clear or pale yellow. °· Make sure you have little or no nausea before eating solid foods. °· Follow the diet recommended by your health care provider. °General instructions °· Have a responsible adult stay with you until you are awake and alert. °· Return to your normal activities as told by your health care provider. Ask your health care provider what activities are safe for you. °· Take over-the-counter and   prescription medicines only as told by your health care provider. °· If you smoke, do not smoke without supervision. °· Keep all follow-up visits as told by your health care provider. This is important. °Contact a health care provider if: °· You continue to have nausea or vomiting at home, and medicines are not helpful. °· You cannot drink fluids or start eating again. °· You cannot urinate after 8-12 hours. °· You develop a skin rash. °· You have fever. °· You have increasing redness at the site of your procedure. °Get help right away if: °· You have difficulty breathing. °· You have chest pain. °· You have unexpected bleeding. °· You feel that you are having a life-threatening or urgent problem. °This information is not intended to replace advice given to you by your health care provider. Make sure you discuss any questions you have with your health care provider. °Document Released: 06/05/2000 Document Revised: 08/02/2015 Document Reviewed: 02/11/2015 °Elsevier Interactive Patient Education  © 2018 [employer] ° °

## 2017-09-11 ENCOUNTER — Encounter
Admission: RE | Disposition: A | Discharge status: Home / Self Care | Payer: Self-pay | Source: Ambulatory Visit | Attending: Otolaryngology

## 2017-09-11 ENCOUNTER — Ambulatory Visit
Admission: RE | Admit: 2017-09-11 | Discharge: 2017-09-11 | Disposition: A | Discharge status: Home / Self Care | Payer: Medicaid Other | Source: Ambulatory Visit | Attending: Otolaryngology | Admitting: Otolaryngology

## 2017-09-11 ENCOUNTER — Ambulatory Visit: Payer: Medicaid Other | Admitting: Anesthesiology

## 2017-09-11 DIAGNOSIS — F209 Schizophrenia, unspecified: Secondary | ICD-10-CM | POA: Diagnosis not present

## 2017-09-11 DIAGNOSIS — I10 Essential (primary) hypertension: Secondary | ICD-10-CM | POA: Insufficient documentation

## 2017-09-11 DIAGNOSIS — F419 Anxiety disorder, unspecified: Secondary | ICD-10-CM | POA: Diagnosis not present

## 2017-09-11 DIAGNOSIS — Z79899 Other long term (current) drug therapy: Secondary | ICD-10-CM | POA: Diagnosis not present

## 2017-09-11 DIAGNOSIS — F17210 Nicotine dependence, cigarettes, uncomplicated: Secondary | ICD-10-CM | POA: Insufficient documentation

## 2017-09-11 DIAGNOSIS — J3501 Chronic tonsillitis: Secondary | ICD-10-CM | POA: Diagnosis present

## 2017-09-11 HISTORY — PX: TONSILLECTOMY AND ADENOIDECTOMY: SHX28

## 2017-09-11 SURGERY — TONSILLECTOMY AND ADENOIDECTOMY
Anesthesia: General | Site: Throat | Wound class: Clean Contaminated

## 2017-09-11 MED ORDER — BUPIVACAINE-EPINEPHRINE (PF) 0.25% -1:200000 IJ SOLN
INTRAMUSCULAR | Status: DC | PRN
  Administered 2017-09-11: 3 mL

## 2017-09-11 MED ORDER — PREDNISOLONE SODIUM PHOSPHATE 15 MG/5ML PO SOLN: ORAL | 0 refills | Status: DC

## 2017-09-11 MED ORDER — DEXAMETHASONE SODIUM PHOSPHATE 4 MG/ML IJ SOLN
INTRAMUSCULAR | Status: DC | PRN
  Administered 2017-09-11: 10 mg via INTRAVENOUS

## 2017-09-11 MED ORDER — FENTANYL CITRATE (PF) 100 MCG/2ML IJ SOLN: 25.0000 ug | INTRAMUSCULAR | Status: DC | PRN

## 2017-09-11 MED ORDER — LIDOCAINE HCL (CARDIAC) PF 100 MG/5ML IV SOSY
PREFILLED_SYRINGE | INTRAVENOUS | Status: DC | PRN
  Administered 2017-09-11: 5 mg via INTRAVENOUS

## 2017-09-11 MED ORDER — PROPOFOL 10 MG/ML IV BOLUS
INTRAVENOUS | Status: DC | PRN
  Administered 2017-09-11: 200 mg via INTRAVENOUS

## 2017-09-11 MED ORDER — ACETAMINOPHEN 10 MG/ML IV SOLN
1000.0000 mg | Freq: Once | INTRAVENOUS | Status: AC
  Administered 2017-09-11: 1000 mg via INTRAVENOUS

## 2017-09-11 MED ORDER — OXYCODONE HCL 5 MG/5ML PO SOLN
5.0000 mg | Freq: Once | ORAL | Status: AC | PRN
  Administered 2017-09-11: 5 mg via ORAL

## 2017-09-11 MED ORDER — FENTANYL CITRATE (PF) 100 MCG/2ML IJ SOLN
INTRAMUSCULAR | Status: DC | PRN
  Administered 2017-09-11: 25 ug via INTRAVENOUS
  Administered 2017-09-11: 100 ug via INTRAVENOUS
  Administered 2017-09-11: 50 ug via INTRAVENOUS
  Administered 2017-09-11 (×3): 25 ug via INTRAVENOUS

## 2017-09-11 MED ORDER — CLARITHROMYCIN 250 MG/5ML PO SUSR: 500.0000 mg | Freq: Two times a day (BID) | ORAL | 0 refills | Status: DC

## 2017-09-11 MED ORDER — ONDANSETRON HCL 4 MG/2ML IJ SOLN
INTRAMUSCULAR | Status: DC | PRN
  Administered 2017-09-11: 4 mg via INTRAVENOUS

## 2017-09-11 MED ORDER — LACTATED RINGERS IV SOLN
INTRAVENOUS | Status: DC
  Administered 2017-09-11 (×2): via INTRAVENOUS

## 2017-09-11 MED ORDER — HYDROCODONE-ACETAMINOPHEN 7.5-325 MG/15ML PO SOLN: ORAL | 0 refills | Status: DC

## 2017-09-11 MED ORDER — MIDAZOLAM HCL 5 MG/5ML IJ SOLN
INTRAMUSCULAR | Status: DC | PRN
  Administered 2017-09-11: 2 mg via INTRAVENOUS

## 2017-09-11 MED ORDER — SUCCINYLCHOLINE CHLORIDE 20 MG/ML IJ SOLN
INTRAMUSCULAR | Status: DC | PRN
  Administered 2017-09-11: 100 mg via INTRAVENOUS

## 2017-09-11 MED ORDER — OXYCODONE HCL 5 MG PO TABS
5.0000 mg | ORAL_TABLET | Freq: Once | ORAL | Status: AC | PRN
Start: 2017-09-11 — End: 2017-09-11

## 2017-09-11 MED ORDER — GLYCOPYRROLATE 0.2 MG/ML IJ SOLN
INTRAMUSCULAR | Status: DC | PRN
  Administered 2017-09-11: 0.1 mg via INTRAVENOUS

## 2017-09-11 SURGICAL SUPPLY — 16 items
BLADE BOVIE TIP EXT 4 (BLADE) ×3 IMPLANT
CANISTER SUCT 1200ML W/VALVE (MISCELLANEOUS) ×3 IMPLANT
CATH ROBINSON RED A/P 10FR (CATHETERS) ×3 IMPLANT
COAG SUCT 10F 3.5MM HAND CTRL (MISCELLANEOUS) ×3 IMPLANT
ELECT REM PT RETURN 9FT ADLT (ELECTROSURGICAL) ×3
ELECTRODE REM PT RTRN 9FT ADLT (ELECTROSURGICAL) ×1 IMPLANT
GLOVE BIO SURGEON STRL SZ7.5 (GLOVE) ×6 IMPLANT
KIT TURNOVER KIT A (KITS) ×3 IMPLANT
NEEDLE HYPO 25GX1X1/2 BEV (NEEDLE) ×3 IMPLANT
NS IRRIG 500ML POUR BTL (IV SOLUTION) ×3 IMPLANT
PACK TONSIL/ADENOIDS (PACKS) ×3 IMPLANT
PENCIL SMOKE EVACUATOR (MISCELLANEOUS) ×3 IMPLANT
SLEEVE SUCTION 125 (MISCELLANEOUS) ×3 IMPLANT
SOL ANTI-FOG 6CC FOG-OUT (MISCELLANEOUS) ×1 IMPLANT
SOL FOG-OUT ANTI-FOG 6CC (MISCELLANEOUS) ×2
SYR 10ML LL (SYRINGE) ×3 IMPLANT

## 2017-09-11 NOTE — Anesthesia Postprocedure Evaluation (Signed)
Anesthesia Post Note  Patient: Manuel Harmon  Procedure(s) Performed: TONSILLECTOMY AND POSSIBLE ADENOIDECTOMY (N/A Throat)  Patient location during evaluation: PACU Anesthesia Type: General Level of consciousness: awake and alert Pain management: pain level controlled Vital Signs Assessment: post-procedure vital signs reviewed and stable Respiratory status: spontaneous breathing Cardiovascular status: blood pressure returned to baseline Postop Assessment: no headache Anesthetic complications: no    Verner Cholunkle, III,  Harlow Carrizales D

## 2017-09-11 NOTE — Op Note (Signed)
09/11/2017  10:55 AM    Manuel Harmon  [id number]   Pre-Op Diagnosis:  TONSILLAR HYPERTROPHY  Post-op Diagnosis: TONSILLAR HYPERTROPHY, CHRONIC TONSILLITIS  Procedure: Tonsillectomy  Surgeon:  Sandi MealyBennett, Lerone Onder S., MD  Anesthesia:  General endotracheal  EBL:  Less than 25 cc  Complications:  None  Findings: 3+ cryptic, chronically inflamed tonsils   Procedure: The patient was taken to the Operating Room and placed in the supine position.  After induction of general endotracheal anesthesia, the table was turned 90 degrees and the patient was draped in the usual fashion  with the eyes protected.  A mouth gag was inserted into the oral cavity to open the mouth, and examination of the oropharynx showed the uvula was non-bifid. The palate was palpated, and there was no evidence of submucous cleft. Examination of the nasopharynx showed no obstructing adenoids. The right tonsil was grasped with an Allis clamp and resected from the tonsillar fossa in the usual fashion with the Bovie. The left tonsil was resected in the same fashion. Both tonsils were severely scarred into the tonsil bed from chronic inflammation with a poorly defined capsule. The Bovie was used to obtain hemostasis. Each tonsillar fossa was then carefully injected with 0.25% marcaine with epinephrine, 1:200,000, avoiding intravascular injection. The nose and throat were irrigated and suctioned to remove any  blood clot. The mouth gag was  removed with no evidence of active bleeding.  The patient was then returned to the anesthesiologist for awakening, and was taken to the Recovery Room in stable condition.  Cultures:  None.  Specimens:  Tonsils.  Disposition:   PACU to home  Plan: Soft, bland diet and push fluids. Take pain medications, steroids and antibiotics as prescribed. No strenuous activity for 2 weeks. Follow-up in 3 weeks.  Sandi Mealyaul S [name] 09/11/2017 10:55 AM

## 2017-09-11 NOTE — Anesthesia Procedure Notes (Signed)
Procedure Name: Intubation Date/Time: 09/11/2017 9:57 AM Performed by: Jimmy PicketAmyot, Tywanda Rice, CRNA Pre-anesthesia Checklist: Patient identified, Emergency Drugs available, Suction available, Patient being monitored and Timeout performed Patient Re-evaluated:Patient Re-evaluated prior to induction Oxygen Delivery Method: Circle system utilized Preoxygenation: Pre-oxygenation with 100% oxygen Induction Type: IV induction Ventilation: Mask ventilation without difficulty Laryngoscope Size: Miller and 3 Grade View: Grade I Tube type: Oral Rae Tube size: 7.5 mm Number of attempts: 1 Placement Confirmation: ETT inserted through vocal cords under direct vision,  positive ETCO2 and breath sounds checked- equal and bilateral Tube secured with: Tape Dental Injury: Teeth and Oropharynx as per pre-operative assessment

## 2017-09-11 NOTE — Anesthesia Preprocedure Evaluation (Signed)
Anesthesia Evaluation  Patient identified by MRN, date of birth, ID band Patient awake    Reviewed: Allergy & Precautions, H&P , NPO status , Patient's Chart, lab work & pertinent test results  Airway Mallampati: I  TM Distance: >3 FB Neck ROM: full    Dental no notable dental hx.    Pulmonary Current Smoker,    Pulmonary exam normal breath sounds clear to auscultation       Cardiovascular hypertension, On Medications Normal cardiovascular exam     Neuro/Psych    GI/Hepatic negative GI ROS, Neg liver ROS,   Endo/Other  negative endocrine ROS  Renal/GU negative Renal ROS     Musculoskeletal   Abdominal   Peds  Hematology negative hematology ROS (+)   Anesthesia Other Findings   Reproductive/Obstetrics negative OB ROS                             Anesthesia Physical Anesthesia Plan  ASA: II  Anesthesia Plan: General ETT   Post-op Pain Management:    Induction:   PONV Risk Score and Plan:   Airway Management Planned:   Additional Equipment:   Intra-op Plan:   Post-operative Plan:   Informed Consent: I have reviewed the patients History and Physical, chart, labs and discussed the procedure including the risks, benefits and alternatives for the proposed anesthesia with the patient or authorized representative who has indicated his/her understanding and acceptance.     Plan Discussed with:   Anesthesia Plan Comments:         Anesthesia Quick Evaluation

## 2017-09-11 NOTE — Transfer of Care (Signed)
Immediate Anesthesia Transfer of Care Note  Patient: Manuel MediateRalph J Kesinger  Procedure(s) Performed: TONSILLECTOMY AND POSSIBLE ADENOIDECTOMY (N/A Throat)  Patient Location: PACU  Anesthesia Type: General ETT  Level of Consciousness: awake, alert  and patient cooperative  Airway and Oxygen Therapy: Patient Spontanous Breathing and Patient connected to supplemental oxygen  Post-op Assessment: Post-op Vital signs reviewed, Patient's Cardiovascular Status Stable, Respiratory Function Stable, Patent Airway and No signs of Nausea or vomiting  Post-op Vital Signs: Reviewed and stable  Complications: No apparent anesthesia complications

## 2017-09-11 NOTE — H&P (Signed)
History and physical reviewed and will be scanned in later. No change in medical status reported by the patient or family, appears stable for surgery. All questions regarding the procedure answered, and patient (or family if a child) expressed understanding of the procedure. ? ?Manuel Harmon Manuel Harmon ?@TODAY@ ?

## 2017-09-12 ENCOUNTER — Encounter: Payer: Self-pay | Admitting: Otolaryngology

## 2017-09-14 LAB — SURGICAL PATHOLOGY

## 2017-11-08 ENCOUNTER — Emergency Department
Admission: EM | Admit: 2017-11-08 | Discharge: 2017-11-08 | Disposition: A | Discharge status: Home / Self Care | Payer: Medicaid Other | Attending: Emergency Medicine | Admitting: Emergency Medicine

## 2017-11-08 ENCOUNTER — Emergency Department: Payer: Medicaid Other

## 2017-11-08 DIAGNOSIS — N289 Disorder of kidney and ureter, unspecified: Secondary | ICD-10-CM | POA: Insufficient documentation

## 2017-11-08 DIAGNOSIS — F17210 Nicotine dependence, cigarettes, uncomplicated: Secondary | ICD-10-CM | POA: Diagnosis not present

## 2017-11-08 DIAGNOSIS — K529 Noninfective gastroenteritis and colitis, unspecified: Secondary | ICD-10-CM

## 2017-11-08 DIAGNOSIS — R531 Weakness: Secondary | ICD-10-CM | POA: Insufficient documentation

## 2017-11-08 DIAGNOSIS — Z79899 Other long term (current) drug therapy: Secondary | ICD-10-CM | POA: Diagnosis not present

## 2017-11-08 DIAGNOSIS — I10 Essential (primary) hypertension: Secondary | ICD-10-CM | POA: Insufficient documentation

## 2017-11-08 DIAGNOSIS — R197 Diarrhea, unspecified: Secondary | ICD-10-CM | POA: Diagnosis present

## 2017-11-08 DIAGNOSIS — K314 Gastric diverticulum: Secondary | ICD-10-CM | POA: Diagnosis not present

## 2017-11-08 DIAGNOSIS — A082 Adenoviral enteritis: Secondary | ICD-10-CM | POA: Diagnosis not present

## 2017-11-08 DIAGNOSIS — K5710 Diverticulosis of small intestine without perforation or abscess without bleeding: Secondary | ICD-10-CM

## 2017-11-08 LAB — URINALYSIS, COMPLETE (UACMP) WITH MICROSCOPIC
BACTERIA UA: NONE SEEN
Bilirubin Urine: NEGATIVE
Glucose, UA: NEGATIVE mg/dL
KETONES UR: 20 mg/dL — AB
Leukocytes, UA: NEGATIVE
Nitrite: NEGATIVE
PH: 5 (ref 5.0–8.0)
PROTEIN: 30 mg/dL — AB
Specific Gravity, Urine: 1.03 (ref 1.005–1.030)

## 2017-11-08 LAB — COMPREHENSIVE METABOLIC PANEL
ALT: 10 U/L (ref 0–44)
ANION GAP: 7 (ref 5–15)
AST: 16 U/L (ref 15–41)
Albumin: 3.4 g/dL — ABNORMAL LOW (ref 3.5–5.0)
Alkaline Phosphatase: 59 U/L (ref 38–126)
BILIRUBIN TOTAL: 0.5 mg/dL (ref 0.3–1.2)
BUN: 13 mg/dL (ref 6–20)
CHLORIDE: 103 mmol/L (ref 98–111)
CO2: 26 mmol/L (ref 22–32)
Calcium: 8.4 mg/dL — ABNORMAL LOW (ref 8.9–10.3)
Creatinine, Ser: 1.59 mg/dL — ABNORMAL HIGH (ref 0.61–1.24)
GFR calc non Af Amer: 51 mL/min — ABNORMAL LOW (ref >60)
GFR, EST AFRICAN AMERICAN: 59 mL/min — AB (ref >60)
Glucose, Bld: 103 mg/dL — ABNORMAL HIGH (ref 70–99)
POTASSIUM: 4.1 mmol/L (ref 3.5–5.1)
Sodium: 136 mmol/L (ref 135–145)
TOTAL PROTEIN: 6.4 g/dL — AB (ref 6.5–8.1)

## 2017-11-08 LAB — C DIFFICILE QUICK SCREEN W PCR REFLEX
C DIFFICILE (CDIFF) INTERP: NOT DETECTED
C DIFFICILE (CDIFF) TOXIN: NEGATIVE
C DIFFICLE (CDIFF) ANTIGEN: NEGATIVE

## 2017-11-08 LAB — CBC
HEMATOCRIT: 50.8 % (ref 40.0–52.0)
Hemoglobin: 17.3 g/dL (ref 13.0–18.0)
MCH: 30.1 pg (ref 26.0–34.0)
MCHC: 34 g/dL (ref 32.0–36.0)
MCV: 88.6 fL (ref 80.0–100.0)
PLATELETS: 274 10*3/uL (ref 150–440)
RBC: 5.74 MIL/uL (ref 4.40–5.90)
RDW: 15.2 % — ABNORMAL HIGH (ref 11.5–14.5)
WBC: 11 10*3/uL — ABNORMAL HIGH (ref 3.8–10.6)

## 2017-11-08 LAB — LIPASE, BLOOD: Lipase: 24 U/L (ref 11–51)

## 2017-11-08 MED ORDER — ONDANSETRON 4 MG PO TBDP: 4.0000 mg | ORAL_TABLET | Freq: Three times a day (TID) | ORAL | 0 refills | Status: DC | PRN

## 2017-11-08 MED ORDER — ACETAMINOPHEN 325 MG PO TABS
650.0000 mg | ORAL_TABLET | Freq: Once | ORAL | Status: AC
  Administered 2017-11-08: 650 mg via ORAL
  Filled 2017-11-08: qty 2

## 2017-11-08 MED ORDER — IOPAMIDOL (ISOVUE-300) INJECTION 61%
30.0000 mL | Freq: Once | INTRAVENOUS | Status: AC
Start: 2017-11-08 — End: 2017-11-08
  Administered 2017-11-08: 30 mL via ORAL

## 2017-11-08 MED ORDER — SODIUM CHLORIDE 0.9 % IV BOLUS
1000.0000 mL | Freq: Once | INTRAVENOUS | Status: AC
  Administered 2017-11-08: 1000 mL via INTRAVENOUS

## 2017-11-08 MED ORDER — CIPROFLOXACIN IN D5W 400 MG/200ML IV SOLN: 400.0000 mg | Freq: Once | INTRAVENOUS | Status: DC

## 2017-11-08 MED ORDER — IOPAMIDOL (ISOVUE-300) INJECTION 61%
100.0000 mL | Freq: Once | INTRAVENOUS | Status: AC | PRN
  Administered 2017-11-08: 100 mL via INTRAVENOUS

## 2017-11-08 MED ORDER — CIPROFLOXACIN HCL 500 MG PO TABS
500.0000 mg | ORAL_TABLET | Freq: Once | ORAL | Status: AC
  Administered 2017-11-08: 500 mg via ORAL
  Filled 2017-11-08: qty 1

## 2017-11-08 MED ORDER — ONDANSETRON HCL 4 MG/2ML IJ SOLN
4.0000 mg | Freq: Once | INTRAMUSCULAR | Status: AC
  Administered 2017-11-08: 4 mg via INTRAVENOUS
  Filled 2017-11-08: qty 2

## 2017-11-08 MED ORDER — HYDROMORPHONE HCL 1 MG/ML IJ SOLN
0.5000 mg | Freq: Once | INTRAMUSCULAR | Status: AC
  Administered 2017-11-08: 0.5 mg via INTRAVENOUS
  Filled 2017-11-08: qty 1

## 2017-11-08 MED ORDER — CIPROFLOXACIN HCL 500 MG PO TABS: 500.0000 mg | ORAL_TABLET | Freq: Two times a day (BID) | ORAL | 0 refills | Status: AC

## 2017-11-08 NOTE — Discharge Instructions (Signed)
Please take a clear liquid diet for the next 24 hours, then advance to a bland diet as tolerated.  Please take the entire course of ciprofloxacin, even if you are feeling better.  Please make a follow-up appoint with your primary care physician for reevaluation.  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  However, it is recommended that you follow-up with a gastroenterology physician for further evaluation.

## 2017-11-08 NOTE — ED Triage Notes (Signed)
Patient c/o weakness, diarrhea, and body aches X 3 days. Patient reports 4 watery stools, in the last 24 hours. Patient reports nausea, denies emesis.

## 2017-11-08 NOTE — ED Provider Notes (Signed)
[facility] Emergency Department Provider Note  ____________________________________________  Time seen: Approximately 8:12 PM  I have reviewed the triage vital signs and the nursing notes.   HISTORY  Chief Complaint Diarrhea and Weakness    HPI Manuel Harmon is a 47 y.o. male with a history of perforated diverticulitis requiring partial colectomy 2012, HTN, presenting with abdominal pain and diarrhea.  The patient reports that for the past 3 days he has been "unable to get out of bed" because of generalized weakness.  He has been having 2-3 episodes of loose watery nonbloody stool with associated nausea but no vomiting.  He has had associated diffuse nonfocal abdominal pain.  He has not had any dysuria or urinary symptoms, fever, but he has had shaking chills.  He has not tried anything for his pain.  No sick contacts, travel outside the [place].  Past Medical History:  Diagnosis Date  . Anxiety and depression   . Diverticulosis of colon   . History of diverticulitis of colon   . Hypertension   . UTI (lower urinary tract infection)     Patient Active Problem List   Diagnosis Date Noted  . Tonsillopharyngitis 08/04/2017  . BP (high blood pressure) 03/23/2015  . Current tobacco use 03/23/2015  . Depression 03/13/2014  . 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 Anxiety disorder 03/13/2014  . PTSD (post-traumatic stress disorder) 03/13/2014  . Cocaine use disorder, mild, abuse (HCC) 03/13/2014  . Cannabis use disorder, moderate, dependence (HCC) 03/13/2014  . Suture reaction 01/15/2014    Past Surgical History:  Procedure Laterality Date  . LEFT COLECTOMY AND APPENDECTOMY  05-31-2007  . RIGHT ORCHIOPEXY INGUINAL APPROACH AND HYDROCELECTOMY  1989  . SCAR REVISION N/A 01/15/2014   Procedure: SCAR REVISION AND REMOVAL OF SUTURE MATERIAL FROM ABDOMINAL WALL;  Surgeon: Darnell Level, MD;  Location: [facility];  Service: General;  Laterality: N/A;  . TONSILLECTOMY AND ADENOIDECTOMY N/A 09/11/2017   Procedure: TONSILLECTOMY AND POSSIBLE ADENOIDECTOMY;  Surgeon: Geanie Logan, MD;  Location: [facility];  Service: ENT;  Laterality: N/A;  NO ADENOID TISSUE PRESENT PER MD    Current Outpatient Rx  . Order #: [id number] Class: Normal  . Order #: [id number] Class: Print  . Order #: [id number] Class: Historical Med  . Order #: [id number] Class: Print  . Order #: [id number] Class: Historical Med  . Order #: [id number] Class: Normal  . Order #: [id number] Class: Print  . Order #: [id number] Class: Historical Med    Allergies Wellbutrin [bupropion]; Ibuprofen; and Penicillins  Family History  Problem Relation Age of Onset  . Drug abuse Father   . Prostate cancer Paternal Grandfather   . Bladder Cancer Neg Hx   . Kidney cancer Neg Hx     Social History Social History   Tobacco Use  . Smoking status: Current Every Day Smoker    Packs/day: 1.00    Years: 33.00    Pack years: 33.00    Types: Cigarettes  . Smokeless tobacco: Never Used  Substance Use Topics  . Alcohol use: Yes    Alcohol/week: 2.0 standard drinks    Types: 2 Standard drinks or equivalent per week  . Drug use: No    Review of Systems Constitutional: No fever, positive chills.  Positive generalized malaise. Eyes: No visual changes. ENT: No sore throat. No congestion or rhinorrhea. Cardiovascular: Denies chest pain. Denies palpitations. Respiratory: Denies shortness of breath.  No cough. Gastrointestinal: Positive diffuse abdominal pain.  Positive  nausea, no vomiting.  Positive nonbloody diarrhea.  No constipation. Genitourinary: Negative for dysuria. Musculoskeletal: Negative for back pain. Skin: Negative for rash. Neurological: Negative for headaches. No focal numbness, tingling or weakness.     ____________________________________________   PHYSICAL EXAM:  VITAL SIGNS: ED Triage Vitals  Enc  Vitals Group     BP 11/08/17 1906 (!) 133/93     Pulse Rate 11/08/17 1906 (!) 124     Resp 11/08/17 1906 18     Temp 11/08/17 1906 99.3 F (37.4 C)     Temp Source 11/08/17 1906 Oral     SpO2 11/08/17 1906 100 %     Weight 11/08/17 1907 185 lb (83.9 kg)     Height 11/08/17 1907 5\' 10"  (1.778 m)     Head Circumference --      Peak Flow --      Pain Score 11/08/17 1906 9     Pain Loc --      Pain Edu? --      Excl. in GC? --     Constitutional: Alert and oriented. Answers questions appropriately.  Uncomfortable appearing.   Eyes: Conjunctivae are normal.  EOMI. No scleral icterus. Head: Atraumatic. Nose: No congestion/rhinnorhea. Mouth/Throat: Mucous membranes are mildly dry.  Neck: No stridor.  Supple.  No JVD.  No meningismus. Cardiovascular: Normal rate, regular rhythm. No murmurs, rubs or gallops.  Respiratory: Normal respiratory effort.  No accessory muscle use or retractions. Lungs CTAB.  No wheezes, rales or ronchi. Gastrointestinal: Soft, and nondistended.  Tender to palpation diffusely in the lower abdomen without focality.  No guarding or rebound.  No peritoneal signs. Musculoskeletal: No LE edema.  Neurologic:  A&Ox3.  Speech is clear.  Face and smile are symmetric.  EOMI.  Moves all extremities well. Skin:  Skin is warm, dry and intact. No rash noted. Psychiatric: Mood and affect are normal. Speech and behavior are normal.  Normal judgement.  ____________________________________________   LABS (all labs ordered are listed, but only abnormal results are displayed)  Labs Reviewed  CBC - Abnormal; Notable for the following components:      Result Value   WBC 11.0 (*)    RDW 15.2 (*)    All other components within normal limits  URINALYSIS, COMPLETE (UACMP) WITH MICROSCOPIC - Abnormal; Notable for the following components:   Color, Urine AMBER (*)    APPearance HAZY (*)    Hgb urine dipstick MODERATE (*)    Ketones, ur 20 (*)    Protein, ur 30 (*)    All other  components within normal limits  COMPREHENSIVE METABOLIC PANEL - Abnormal; Notable for the following components:   Glucose, Bld 103 (*)    Creatinine, Ser 1.59 (*)    Calcium 8.4 (*)    Total Protein 6.4 (*)    Albumin 3.4 (*)    GFR calc non Af Amer 51 (*)    GFR calc Af Amer 59 (*)    All other components within normal limits  GASTROINTESTINAL PANEL BY PCR, STOOL (REPLACES STOOL CULTURE)  C DIFFICILE QUICK SCREEN W PCR REFLEX  LIPASE, BLOOD   ____________________________________________  EKG  ED ECG REPORT I, Anne-Caroline Sharma Covert, the attending physician, personally viewed and interpreted this ECG.   Date: 11/08/2017  EKG Time: 1916  Rate: 121  Rhythm: sinus tachycardia  Axis: normal  Intervals:none  ST&T Change: No STEMI  ____________________________________________  RADIOLOGY  Ct Abdomen Pelvis W Contrast  Result Date: 11/08/2017 CLINICAL DATA:  RIGHT acute  abdominal pain. History of urinary tract infection, LEFT colectomy and appendectomy. EXAM: CT ABDOMEN AND PELVIS WITH CONTRAST TECHNIQUE: Multidetector CT imaging of the abdomen and pelvis was performed using the standard protocol following bolus administration of intravenous contrast. CONTRAST:  100mL ISOVUE-300 IOPAMIDOL (ISOVUE-300) INJECTION 61% COMPARISON:  CT abdomen and pelvis August 27, 2017 FINDINGS: LOWER CHEST: Lung bases are clear. Included heart size is normal. No pericardial effusion. HEPATOBILIARY: Liver and gallbladder are normal. PANCREAS: Normal. SPLEEN: Normal. ADRENALS/URINARY TRACT: Kidneys are orthotopic, demonstrating symmetric enhancement. No nephrolithiasis, hydronephrosis or solid renal masses. The unopacified ureters are normal in course and caliber. Delayed imaging through the kidneys demonstrates symmetric prompt contrast excretion within the proximal urinary collecting system. Urinary bladder is partially distended and unremarkable. Normal adrenal glands. STOMACH/BOWEL: Fluid-filled nondistended  colon with air-fluid levels. Duodenal diverticulum with similar minimal surrounding extraluminal density. VASCULAR/LYMPHATIC: Aortoiliac vessels are normal in course and caliber. Mild mesenteric edema and mild mesenteric lymphadenopathy. REPRODUCTIVE: Normal. OTHER: No intraperitoneal free fluid or free air. MUSCULOSKELETAL: Nonacute. Anterior abdominal wall scarring. Small fat containing umbilical hernia. Mild bilateral hip osteoarthrosis. IMPRESSION: 1. Colonic air-fluid levels seen with enteritis. Associated mesenteric edema and mild lymphadenopathy. 2. Duodenal diverticulum with marginal external density similar to prior CT, differential diagnosis includes previous perforation or continued leak. Recommend non emergent GI consultation. Non emergent upper GI may be of added value. Electronically Signed   By: Awilda Metroourtnay  Bloomer M.D.   On: 11/08/2017 22:40    ____________________________________________   PROCEDURES  Procedure(s) performed: None  Procedures  Critical Care performed: No ____________________________________________   INITIAL IMPRESSION / ASSESSMENT AND PLAN / ED COURSE  Pertinent labs & imaging results that were available during my care of the patient were reviewed by me and considered in my medical decision making (see chart for details).  47 y.o. with a history of perforated diverticulitis status post partial colectomy presenting with diffuse lower abdominal pain, diarrhea, and generalized malaise with shaking chills.  Overall, the patient has a temperature of 99.3 and a heart rate in the 120s but is maintaining his blood pressure well.  Overall he is nontoxic in his abdomen does not demonstrate any peritonitis, but given his history and recent clinical course, will order CT scan for further evaluation.  The patient's laboratory studies do show a white blood cell count of 11.0 and I am awaiting the results of the remainder of his studies.  The patient will receive intravenous  fluids with pain control and antiemetic, and I will plan reevaluation for final disposition.  ----------------------------------------- 9:34 PM on 11/08/2017 -----------------------------------------  The patient is not orthostatic.  He does have a bump in his creatinine to 1.59 and I will continue to treat him with intravenous fluids, as this is likely due to some dehydration.  I am awaiting the results of his CT scan.  ----------------------------------------- 10:56 PM on 11/08/2017 -----------------------------------------  The patient feels much better at this time.  His CT scan is consistent with enteritis and I will treat him with a 7-day course of antibiotics.  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 Tonna BoehringerSakai, the general surgeon on-call.  At this time, there is no additional work-up that is needed for this abnormal finding; the patient will follow up with an outpatient GI physician.  At this time, the patient is safe for discharge home.  Return precautions were discussed.  ____________________________________________  FINAL CLINICAL IMPRESSION(S) / ED DIAGNOSES  Final diagnoses:  Acute renal insufficiency  Enteritis  Duodenal  diverticulum         NEW MEDICATIONS STARTED DURING THIS VISIT:  New Prescriptions   CIPROFLOXACIN (CIPRO) 500 MG TABLET    Take 1 tablet (500 mg total) by mouth 2 (two) times daily for 7 days.   ONDANSETRON (ZOFRAN ODT) 4 MG DISINTEGRATING TABLET    Take 1 tablet (4 mg total) by mouth every 8 (eight) hours as needed for nausea or vomiting.      Rockne Menghini, MD 11/08/17 [id number]

## 2017-11-13 ENCOUNTER — Encounter: Payer: Self-pay | Admitting: Gastroenterology

## 2017-11-13 ENCOUNTER — Ambulatory Visit (INDEPENDENT_AMBULATORY_CARE_PROVIDER_SITE_OTHER): Payer: Medicaid Other | Admitting: Gastroenterology

## 2017-11-13 ENCOUNTER — Other Ambulatory Visit: Payer: Self-pay

## 2017-11-13 VITALS — BP 128/83 | HR 82 | Resp 17 | Wt 176.6 lb

## 2017-11-13 DIAGNOSIS — Z1211 Encounter for screening for malignant neoplasm of colon: Secondary | ICD-10-CM

## 2017-11-13 DIAGNOSIS — R197 Diarrhea, unspecified: Secondary | ICD-10-CM

## 2017-11-13 NOTE — Progress Notes (Signed)
Arlyss Repress, MD [address]  [address]  [place], [place] [address]  Main: [phone]  Fax: [phone]    Gastroenterology Consultation  Referring Provider:     Hillery Aldo, MD Primary Care Physician:  Hillery Aldo, MD Primary Gastroenterologist:  Dr. Arlyss Repress Reason for Consultation:  Acute diarrhea        HPI:   Manuel Harmon is a 47 y.o. male referred by Dr. Hillery Aldo, MD  for consultation & management of acute diarrhea. He went to ER on 11/08/2017 secondary to 3 days of generalized body aches, nonbloody diarrhea, generalized weakness, he had 10 watery bowel movements/day and right-sided abdominal pain for about 3 days prior to ER visit. C. Difficile was negative. He had mild leukocytosis. He underwent CT A/P which revealed mild mesenteric edema and lymphadenopathy as well as a fluid levels in the colon. He was empirically discharged on 7 days course of ciprofloxacin. Patient reports that his diarrhea is improving. He had 3 soft bowel movements yesterday. He denies abdominal pain. He had history of acute diverticulitis and underwent left-sided colectomy in 2009. He reports that he had a colonoscopy prior to the episode of diverticulitis.   He is able to tolerate by mouth well. Denies abdominal pain, nausea or vomiting He smokes tobacco and marijuana  NSAIDs: none  Antiplts/Anticoagulants/Anti thrombotics: none  GI Procedures: colonoscopy approximately in 2009, records not available  Past Medical History:  Diagnosis Date  . Anxiety and depression   . Diverticulosis of colon   . History of diverticulitis of colon   . Hypertension   . UTI (lower urinary tract infection)     Past Surgical History:  Procedure Laterality Date  . LEFT COLECTOMY AND APPENDECTOMY  05-31-2007  . RIGHT ORCHIOPEXY INGUINAL APPROACH AND HYDROCELECTOMY  1989  . SCAR REVISION N/A 01/15/2014   Procedure: SCAR REVISION AND REMOVAL OF SUTURE MATERIAL FROM ABDOMINAL WALL;  Surgeon:  Darnell Level, MD;  Location: [facility];  Service: General;  Laterality: N/A;  . TONSILLECTOMY AND ADENOIDECTOMY N/A 09/11/2017   Procedure: TONSILLECTOMY AND POSSIBLE ADENOIDECTOMY;  Surgeon: Geanie Logan, MD;  Location: [facility];  Service: ENT;  Laterality: N/A;  NO ADENOID TISSUE PRESENT PER MD    Current Outpatient Medications:  .  ciprofloxacin (CIPRO) 500 MG tablet, Take 1 tablet (500 mg total) by mouth 2 (two) times daily for 7 days., Disp: 14 tablet, Rfl: 0 .  clonazePAM (KLONOPIN) 1 MG tablet, Take 1 mg by mouth at bedtime., Disp: , Rfl:  .  triamcinolone cream (KENALOG) 0.1 %, Apply 1 application topically 2 (two) times daily as needed., Disp: , Rfl:  .  clarithromycin (BIAXIN) 250 MG/5ML suspension, Take 10 mLs (500 mg total) by mouth 2 (two) times daily. (Patient not taking: Reported on 11/13/2017), Disp: 200 mL, Rfl: 0 .  HYDROcodone-acetaminophen (HYCET) 7.5-325 mg/15 ml solution, 10-15cc PO every 4-6 hours as needed for pain (Patient not taking: Reported on 11/13/2017), Disp: 250 mL, Rfl: 0 .  lisinopril (PRINIVIL,ZESTRIL) 20 MG tablet, Take by mouth., Disp: , Rfl:  .  meloxicam (MOBIC) 7.5 MG tablet, Take by mouth., Disp: , Rfl:  .  mirtazapine (REMERON) 30 MG tablet, , Disp: , Rfl:  .  mirtazapine (REMERON) 45 MG tablet, Take 45 mg by mouth at bedtime., Disp: , Rfl:  .  ondansetron (ZOFRAN ODT) 4 MG disintegrating tablet, Take 1 tablet (4 mg total) by mouth every 8 (eight) hours as needed for nausea or  vomiting. (Patient not taking: Reported on 11/13/2017), Disp: 20 tablet, Rfl: 0 .  prednisoLONE (ORAPRED) 15 MG/5ML solution, 10cc PO BID x 3 days, then 5cc PO BID x 3 days, then 5cc PO QD x 3 days (Patient not taking: Reported on 11/13/2017), Disp: 120 mL, Rfl: 0 .  traMADol (ULTRAM) 50 MG tablet, Take by mouth., Disp: , Rfl:     Family History  Problem Relation Age of Onset  . Drug abuse Father   . Prostate cancer Paternal Grandfather   . Bladder Cancer Neg  Hx   . Kidney cancer Neg Hx      Social History   Tobacco Use  . Smoking status: Current Every Day Smoker    Packs/day: 1.00    Years: 33.00    Pack years: 33.00    Types: Cigarettes  . Smokeless tobacco: Never Used  Substance Use Topics  . Alcohol use: Yes    Alcohol/week: 2.0 standard drinks    Types: 2 Standard drinks or equivalent per week  . Drug use: No    Allergies as of 11/13/2017 - Review Complete 11/13/2017  Allergen Reaction Noted  . Wellbutrin [bupropion] Hives and Other (See Comments) 01/13/2014  . Ibuprofen Nausea And Vomiting, Other (See Comments), and Rash 01/13/2014  . Penicillins Swelling and Rash 04/18/2016    Review of Systems:    All systems reviewed and negative except where noted in HPI.   Physical Exam:  BP 128/83 (BP Location: Left Arm, Patient Position: Sitting, Cuff Size: Large)   Pulse 82   Resp 17   Wt 176 lb 9.6 oz (80.1 kg)   BMI 25.34 kg/m  No LMP for male patient.  General:   Alert,  Well-developed, well-nourished, pleasant and cooperative in NAD Head:  Normocephalic and atraumatic. Eyes:  Sclera clear, no icterus.   Conjunctiva pink. Ears:  Normal auditory acuity. Nose:  No deformity, discharge, or lesions. Mouth:  No deformity or lesions,oropharynx pink & moist. Neck:  Supple; no masses or thyromegaly. Lungs:  Respirations even and unlabored.  Clear throughout to auscultation.   No wheezes, crackles, or rhonchi. No acute distress. Heart:  Regular rate and rhythm; no murmurs, clicks, rubs, or gallops. Abdomen:  Normal bowel sounds. Soft, Midline scar in the lower abdomen, non-tender and non-distended without masses, hepatosplenomegaly or hernias noted.  No guarding or rebound tenderness.   Rectal: Not performed Msk:  Symmetrical without gross deformities. Good, equal movement & strength bilaterally. Pulses:  Normal pulses noted. Extremities:  No clubbing or edema.  No cyanosis. Neurologic:  Alert and oriented x3;  grossly normal  neurologically. Skin:  Intact without significant lesions or rashes. No jaundice. Lymph Nodes:  No significant cervical adenopathy. Psych:  Alert and cooperative. Normal mood and affect.  Imaging Studies: reviewed  Assessment and Plan:   Manuel Harmon is a 47 y.o. African-American male with past history of cocaine abuse, smokes tobacco and marijuana, history of acute sigmoid diverticulitis, status post left colectomy presents with one-week history of nonbloody diarrhea and mild leukocytosis, CT revealed mild mesenteric lymphadenopathy and edema. Differentials include acute bacterial or viral gastroenteritis or new onset of inflammatory bowel disease given history of smoking. He is also due for colon cancer screening given his age and ethnicity  Therefore, recommend colonoscopy with biopsies   Follow up in 2 months   Arlyss Repress, MD

## 2017-11-20 ENCOUNTER — Other Ambulatory Visit: Payer: Self-pay

## 2017-12-05 ENCOUNTER — Encounter: Payer: Self-pay | Admitting: Emergency Medicine

## 2017-12-06 ENCOUNTER — Ambulatory Visit: Payer: Medicaid Other | Admitting: Certified Registered Nurse Anesthetist

## 2017-12-06 ENCOUNTER — Encounter: Payer: Self-pay | Admitting: Emergency Medicine

## 2017-12-06 ENCOUNTER — Ambulatory Visit
Admission: RE | Admit: 2017-12-06 | Discharge: 2017-12-06 | Disposition: A | Discharge status: Home / Self Care | Payer: Medicaid Other | Source: Ambulatory Visit | Attending: Gastroenterology | Admitting: Gastroenterology

## 2017-12-06 ENCOUNTER — Encounter
Admission: RE | Disposition: A | Discharge status: Home / Self Care | Payer: Self-pay | Source: Ambulatory Visit | Attending: Gastroenterology

## 2017-12-06 DIAGNOSIS — Z1211 Encounter for screening for malignant neoplasm of colon: Secondary | ICD-10-CM | POA: Diagnosis not present

## 2017-12-06 DIAGNOSIS — I10 Essential (primary) hypertension: Secondary | ICD-10-CM | POA: Diagnosis not present

## 2017-12-06 DIAGNOSIS — F419 Anxiety disorder, unspecified: Secondary | ICD-10-CM | POA: Insufficient documentation

## 2017-12-06 DIAGNOSIS — K5730 Diverticulosis of large intestine without perforation or abscess without bleeding: Secondary | ICD-10-CM | POA: Diagnosis not present

## 2017-12-06 DIAGNOSIS — Z980 Intestinal bypass and anastomosis status: Secondary | ICD-10-CM | POA: Insufficient documentation

## 2017-12-06 DIAGNOSIS — Z8719 Personal history of other diseases of the digestive system: Secondary | ICD-10-CM | POA: Insufficient documentation

## 2017-12-06 DIAGNOSIS — F329 Major depressive disorder, single episode, unspecified: Secondary | ICD-10-CM | POA: Insufficient documentation

## 2017-12-06 DIAGNOSIS — Z79899 Other long term (current) drug therapy: Secondary | ICD-10-CM | POA: Diagnosis not present

## 2017-12-06 DIAGNOSIS — K648 Other hemorrhoids: Secondary | ICD-10-CM | POA: Insufficient documentation

## 2017-12-06 DIAGNOSIS — Z886 Allergy status to analgesic agent status: Secondary | ICD-10-CM | POA: Diagnosis not present

## 2017-12-06 DIAGNOSIS — Z880 Allergy status to penicillin: Secondary | ICD-10-CM | POA: Insufficient documentation

## 2017-12-06 DIAGNOSIS — Z888 Allergy status to other drugs, medicaments and biological substances status: Secondary | ICD-10-CM | POA: Diagnosis not present

## 2017-12-06 DIAGNOSIS — Z9049 Acquired absence of other specified parts of digestive tract: Secondary | ICD-10-CM | POA: Insufficient documentation

## 2017-12-06 DIAGNOSIS — F17210 Nicotine dependence, cigarettes, uncomplicated: Secondary | ICD-10-CM | POA: Diagnosis not present

## 2017-12-06 HISTORY — DX: Major depressive disorder, single episode, unspecified: F32.9

## 2017-12-06 HISTORY — DX: Depression, unspecified: F32.A

## 2017-12-06 HISTORY — PX: COLONOSCOPY WITH PROPOFOL: SHX5780

## 2017-12-06 SURGERY — COLONOSCOPY WITH PROPOFOL
Anesthesia: General

## 2017-12-06 MED ORDER — SODIUM CHLORIDE 0.9 % IV SOLN
INTRAVENOUS | Status: DC
  Administered 2017-12-06: 1000 mL via INTRAVENOUS

## 2017-12-06 MED ORDER — MIDAZOLAM HCL 2 MG/2ML IJ SOLN
INTRAMUSCULAR | Status: AC
  Filled 2017-12-06: qty 2

## 2017-12-06 MED ORDER — PROPOFOL 500 MG/50ML IV EMUL
INTRAVENOUS | Status: DC | PRN
  Administered 2017-12-06: 160 ug/kg/min via INTRAVENOUS

## 2017-12-06 MED ORDER — MIDAZOLAM HCL 2 MG/2ML IJ SOLN
INTRAMUSCULAR | Status: DC | PRN
  Administered 2017-12-06: 2 mg via INTRAVENOUS

## 2017-12-06 MED ORDER — LIDOCAINE HCL (CARDIAC) PF 100 MG/5ML IV SOSY
PREFILLED_SYRINGE | INTRAVENOUS | Status: DC | PRN
  Administered 2017-12-06: 40 mg via INTRAVENOUS

## 2017-12-06 MED ORDER — PROPOFOL 10 MG/ML IV BOLUS
INTRAVENOUS | Status: DC | PRN
  Administered 2017-12-06: 100 mg via INTRAVENOUS

## 2017-12-06 NOTE — Anesthesia Procedure Notes (Signed)
Date/Time: 12/06/2017 3:15 PM Performed by: Stormy Fabian, CRNA Pre-anesthesia Checklist: Patient identified, Emergency Drugs available, Suction available and Patient being monitored Patient Re-evaluated:Patient Re-evaluated prior to induction Oxygen Delivery Method: Nasal cannula Induction Type: IV induction Dental Injury: Teeth and Oropharynx as per pre-operative assessment  Comments: Nasal cannula with etCO2 monitoring

## 2017-12-06 NOTE — Transfer of Care (Signed)
Immediate Anesthesia Transfer of Care Note  Patient: Manuel Harmon  Procedure(s) Performed: Procedure(s): COLONOSCOPY WITH PROPOFOL (N/A)  Patient Location: PACU and Endoscopy Unit  Anesthesia Type:General  Level of Consciousness: sedated  Airway & Oxygen Therapy: Patient Spontanous Breathing and Patient connected to nasal cannula oxygen  Post-op Assessment: Report given to RN and Post -op Vital signs reviewed and stable  Post vital signs: Reviewed and stable  Last Vitals:  Vitals:   12/06/17 1410 12/06/17 1529  BP: (!) 124/93 115/77  Pulse: 75 82  Resp: 14   Temp: 36.6 C (!) 36.1 C  SpO2: 100% 100%    Complications: No apparent anesthesia complications

## 2017-12-06 NOTE — H&P (Signed)
Arlyss Repress, MD [address]  [address]  [place], [place] [address]  Main: [phone]  Fax: [phone] Pager: [phone]  Primary Care Physician:  Hillery Aldo, MD Primary Gastroenterologist:  Dr. Arlyss Repress  Pre-Procedure History & Physical: HPI:  Manuel Harmon is a 47 y.o. male is here for an colonoscopy.   Past Medical History:  Diagnosis Date  . Anxiety and depression   . Depression   . Diverticulosis of colon   . History of diverticulitis of colon   . Hypertension   . UTI (lower urinary tract infection)     Past Surgical History:  Procedure Laterality Date  . LEFT COLECTOMY AND APPENDECTOMY  05-31-2007  . RIGHT ORCHIOPEXY INGUINAL APPROACH AND HYDROCELECTOMY  1989  . SCAR REVISION N/A 01/15/2014   Procedure: SCAR REVISION AND REMOVAL OF SUTURE MATERIAL FROM ABDOMINAL WALL;  Surgeon: Darnell Level, MD;  Location: [facility];  Service: General;  Laterality: N/A;  . TONSILLECTOMY AND ADENOIDECTOMY N/A 09/11/2017   Procedure: TONSILLECTOMY AND POSSIBLE ADENOIDECTOMY;  Surgeon: Geanie Logan, MD;  Location: [facility];  Service: ENT;  Laterality: N/A;  NO ADENOID TISSUE PRESENT PER MD    Prior to Admission medications   Medication Sig Start Date End Date Taking? Authorizing Provider  clonazePAM (KLONOPIN) 1 MG tablet Take 1 mg by mouth at bedtime.   Yes [provider]  mirtazapine (REMERON) 30 MG tablet  07/28/15  Yes [provider]  mirtazapine (REMERON) 45 MG tablet Take 45 mg by mouth at bedtime.   Yes [provider]  clarithromycin (BIAXIN) 250 MG/5ML suspension Take 10 mLs (500 mg total) by mouth 2 (two) times daily. Patient not taking: Reported on 11/13/2017 09/11/17   Geanie Logan, MD  HYDROcodone-acetaminophen (HYCET) 7.5-325 mg/15 ml solution 10-15cc PO every 4-6 hours as needed for pain Patient not taking: Reported on 11/13/2017 09/11/17   Geanie Logan, MD  lisinopril (PRINIVIL,ZESTRIL) 20 MG tablet Take  by mouth. 03/17/14   [provider]  meloxicam (MOBIC) 7.5 MG tablet Take by mouth. 01/18/16   [provider]  ondansetron (ZOFRAN ODT) 4 MG disintegrating tablet Take 1 tablet (4 mg total) by mouth every 8 (eight) hours as needed for nausea or vomiting. Patient not taking: Reported on 11/13/2017 11/08/17   Rockne Menghini, MD  prednisoLONE (ORAPRED) 15 MG/5ML solution 10cc PO BID x 3 days, then 5cc PO BID x 3 days, then 5cc PO QD x 3 days Patient not taking: Reported on 11/13/2017 09/11/17   Geanie Logan, MD  traMADol Janean Sark) 50 MG tablet Take by mouth. 08/17/15   [provider]  triamcinolone cream (KENALOG) 0.1 % Apply 1 application topically 2 (two) times daily as needed.    [provider]    Allergies as of 11/13/2017 - Review Complete 11/13/2017  Allergen Reaction Noted  . Wellbutrin [bupropion] Hives and Other (See Comments) 01/13/2014  . Ibuprofen Nausea And Vomiting, Other (See Comments), and Rash 01/13/2014  . Penicillins Swelling and Rash 04/18/2016    Family History  Problem Relation Age of Onset  . Drug abuse Father   . Prostate cancer Paternal Grandfather   . Bladder Cancer Neg Hx   . Kidney cancer Neg Hx     Social History   Socioeconomic History  . Marital status: Legally Separated    Spouse name: Not on file  . Number of children: Not on file  . Years of education: Not on file  . Highest education level:  Not on file  Occupational History  . Not on file  Social Needs  . Financial resource strain: Not hard at all  . Food insecurity:    Worry: Never true    Inability: Never true  . Transportation needs:    Medical: No    Non-medical: No  Tobacco Use  . Smoking status: Current Every Day Smoker    Packs/day: 1.00    Years: 33.00    Pack years: 33.00    Types: Cigarettes  . Smokeless tobacco: Never Used  Substance and Sexual Activity  . Alcohol use: Yes    Alcohol/week: 2.0 standard drinks    Types: 2 Standard drinks  or equivalent per week  . Drug use: No  . Sexual activity: Yes  Lifestyle  . Physical activity:    Days per week: Patient refused    Minutes per session: Patient refused  . Stress: Not at all  Relationships  . Social connections:    Talks on phone: Patient refused    Gets together: Patient refused    Attends religious service: Patient refused    Active member of club or organization: Patient refused    Attends meetings of clubs or organizations: Patient refused    Relationship status: Patient refused  . Intimate partner violence:    Fear of current or ex partner: Patient refused    Emotionally abused: Patient refused    Physically abused: Patient refused    Forced sexual activity: Patient refused  Other Topics Concern  . Not on file  Social History Narrative  . Not on file    Review of Systems: See HPI, otherwise negative ROS  Physical Exam: BP (!) 124/93   Pulse 75   Temp 97.8 F (36.6 C) (Oral)   Resp 14   Ht 5\' 10"  (1.778 m)   Wt 79.8 kg   SpO2 100%   BMI 25.25 kg/m  General:   Alert,  pleasant and cooperative in NAD Head:  Normocephalic and atraumatic. Neck:  Supple; no masses or thyromegaly. Lungs:  Clear throughout to auscultation.    Heart:  Regular rate and rhythm. Abdomen:  Soft, nontender and nondistended. Normal bowel sounds, without guarding, and without rebound.   Neurologic:  Alert and  oriented x4;  grossly normal neurologically.  Impression/Plan: Manuel Harmon is here for an colonoscopy to be performed for colon cancer screening  Risks, benefits, limitations, and alternatives regarding  colonoscopy have been reviewed with the patient.  Questions have been answered.  All parties agreeable.   Lannette Donath, MD  12/06/2017, 2:34 PM

## 2017-12-06 NOTE — Op Note (Addendum)
[facility] Patient Name: Mauricio Dahlen Procedure Date: 12/06/2017 3:10 PM MRN: [id number] Account #: [id number] Date of Birth: October 16, 1970 Admit Type: Outpatient Age: 47 Room: [facility] ENDO ROOM 3 Gender: Male Note Status: Finalized Procedure:            Colonoscopy Indications:          Screening for colorectal malignant neoplasm, h/o left                        sided colectomy secondary to acute sigmoid                        diverticulitis and stricture in 2009 Providers:            Lin Landsman MD, MD Medicines:            Monitored Anesthesia Care Complications:        No immediate complications. Estimated blood loss: None. Procedure:            Pre-Anesthesia Assessment:                       - Prior to the procedure, a History and Physical was                        performed, and patient medications and allergies were                        reviewed. The patient is competent. The risks and                        benefits of the procedure and the sedation options and                        risks were discussed with the patient. All questions                        were answered and informed consent was obtained.                        Patient identification and proposed procedure were                        verified by the physician, the nurse, the                        anesthesiologist, the anesthetist and the technician in                        the pre-procedure area in the procedure room in the                        endoscopy suite. Mental Status Examination: alert and                        oriented. Airway Examination: normal oropharyngeal                        airway and neck mobility. Respiratory Examination:  clear to auscultation. CV Examination: normal.                        Prophylactic Antibiotics: The patient does not require                        prophylactic antibiotics. Prior Anticoagulants: The                         patient has taken no previous anticoagulant or                        antiplatelet agents. ASA Grade Assessment: II - A                        patient with mild systemic disease. After reviewing the                        risks and benefits, the patient was deemed in                        satisfactory condition to undergo the procedure. The                        anesthesia plan was to use monitored anesthesia care                        (MAC). Immediately prior to administration of                        medications, the patient was re-assessed for adequacy                        to receive sedatives. The heart rate, respiratory rate,                        oxygen saturations, blood pressure, adequacy of                        pulmonary ventilation, and response to care were                        monitored throughout the procedure. The physical status                        of the patient was re-assessed after the procedure.                       After obtaining informed consent, the colonoscope was                        passed under direct vision. Throughout the procedure,                        the patient's blood pressure, pulse, and oxygen                        saturations were monitored continuously. The                        Colonoscope  was introduced through the anus and                        advanced to the the cecum, identified by appendiceal                        orifice and ileocecal valve. The colonoscopy was                        performed without difficulty. The patient tolerated the                        procedure well. The quality of the bowel preparation                        was evaluated using the BBPS [facility] Bowel Preparation                        Scale) with scores of: Right Colon = 3, Transverse                        Colon = 3 and Left Colon = 3 (entire mucosa seen well                        with no residual staining, small fragments of  stool or                        opaque liquid). The total BBPS score equals 9. Findings:      The perianal and digital rectal examinations were normal. Pertinent       negatives include normal sphincter tone and no palpable rectal lesions.      Many diverticula were found in the sigmoid colon.      Normal mucosa was found in the entire colon.      Non-bleeding internal hemorrhoids were found during retroflexion. The       hemorrhoids were large.      There was evidence of a prior end-to-end colo-colonic anastomosis in the       sigmoid colon. This was patent and was characterized by healthy       appearing mucosa. The anastomosis was traversed. Impression:           - Diverticulosis in the sigmoid colon.                       - Normal mucosa in the entire examined colon.                       - Non-bleeding internal hemorrhoids.                       - No specimens collected. Recommendation:       - Discharge patient to home (with escort).                       - Resume previous diet today.                       - Continue present medications.                       -  Repeat colonoscopy in 10 years for surveillance. Procedure Code(s):    --- Professional ---                       A5790, Colorectal cancer screening; colonoscopy on                        individual not meeting criteria for high risk Diagnosis Code(s):    --- Professional ---                       Z12.11, Encounter for screening for malignant neoplasm                        of colon                       K64.8, Other hemorrhoids                       K57.30, Diverticulosis of large intestine without                        perforation or abscess without bleeding CPT copyright 2017 American Medical Association. All rights reserved. The codes documented in this report are preliminary and upon coder review may  be revised to meet current compliance requirements. Dr. Ulyess Mort Lin Landsman MD, MD 12/06/2017 3:28:30  PM This report has been signed electronically. Number of Addenda: 0 Note Initiated On: 12/06/2017 3:10 PM Scope Withdrawal Time: 0 hours 6 minutes 31 seconds  Total Procedure Duration: 0 hours 8 minutes 11 seconds       [facility]

## 2017-12-06 NOTE — Anesthesia Post-op Follow-up Note (Signed)
Anesthesia QCDR form completed.        

## 2017-12-06 NOTE — Anesthesia Preprocedure Evaluation (Signed)
Anesthesia Evaluation  Patient identified by MRN, date of birth, ID band Patient awake    Reviewed: Allergy & Precautions, H&P , NPO status , reviewed documented beta blocker date and time   Airway Mallampati: II  TM Distance: >3 FB Neck ROM: full    Dental  (+) Teeth Intact   Pulmonary Current Smoker,    Pulmonary exam normal        Cardiovascular hypertension, Normal cardiovascular exam     Neuro/Psych PSYCHIATRIC DISORDERS Anxiety Depression    GI/Hepatic   Endo/Other    Renal/GU      Musculoskeletal   Abdominal   Peds  Hematology   Anesthesia Other Findings Past Medical History: No date: Anxiety and depression No date: Depression No date: Diverticulosis of colon No date: History of diverticulitis of colon No date: Hypertension No date: UTI (lower urinary tract infection)  Past Surgical History: 05-31-2007: LEFT COLECTOMY AND APPENDECTOMY 1989: RIGHT ORCHIOPEXY INGUINAL APPROACH AND HYDROCELECTOMY 01/15/2014: SCAR REVISION; N/A     Comment:  Procedure: SCAR REVISION AND REMOVAL OF SUTURE MATERIAL               FROM ABDOMINAL WALL;  Surgeon: Darnell Level, MD;                Location: [facility];  Service: General;               Laterality: N/A; 09/11/2017: TONSILLECTOMY AND ADENOIDECTOMY; N/A     Comment:  Procedure: TONSILLECTOMY AND POSSIBLE ADENOIDECTOMY;                Surgeon: Geanie Logan, MD;  Location: [facility];  Service: ENT;  Laterality: N/A;  NO ADENOID TISSUE              PRESENT PER MD  BMI    Body Mass Index:  25.25 kg/m      Reproductive/Obstetrics                             Anesthesia Physical Anesthesia Plan  ASA: II  Anesthesia Plan: General   Post-op Pain Management:    Induction: Intravenous  PONV Risk Score and Plan: 1 and Treatment may vary due to age or medical condition and TIVA  Airway Management  Planned: Nasal Cannula and Natural Airway  Additional Equipment:   Intra-op Plan:   Post-operative Plan:   Informed Consent: I have reviewed the patients History and Physical, chart, labs and discussed the procedure including the risks, benefits and alternatives for the proposed anesthesia with the patient or authorized representative who has indicated his/her understanding and acceptance.   Dental Advisory Given  Plan Discussed with: CRNA  Anesthesia Plan Comments:         Anesthesia Quick Evaluation

## 2017-12-10 ENCOUNTER — Encounter: Payer: Self-pay | Admitting: Gastroenterology

## 2017-12-10 NOTE — Anesthesia Postprocedure Evaluation (Signed)
Anesthesia Post Note  Patient: Manuel Harmon  Procedure(s) Performed: COLONOSCOPY WITH PROPOFOL (N/A )  Patient location during evaluation: Endoscopy Anesthesia Type: General Level of consciousness: awake and alert Pain management: pain level controlled Vital Signs Assessment: post-procedure vital signs reviewed and stable Respiratory status: spontaneous breathing, nonlabored ventilation, respiratory function stable and patient connected to nasal cannula oxygen Cardiovascular status: blood pressure returned to baseline and stable Postop Assessment: no apparent nausea or vomiting Anesthetic complications: no     Last Vitals:  Vitals:   12/06/17 1539 12/06/17 1549  BP:  130/85  Pulse:  68  Resp: 10 17  Temp:    SpO2:  100%    Last Pain:  Vitals:   12/07/17 0753  TempSrc:   PainSc: 0-No pain                 Lenard Simmer

## 2017-12-25 ENCOUNTER — Encounter: Payer: Self-pay | Admitting: Gastroenterology

## 2017-12-25 ENCOUNTER — Ambulatory Visit: Payer: Self-pay | Admitting: Gastroenterology

## 2017-12-25 DIAGNOSIS — Z1211 Encounter for screening for malignant neoplasm of colon: Secondary | ICD-10-CM

## 2018-02-22 ENCOUNTER — Other Ambulatory Visit: Payer: Self-pay

## 2018-02-22 ENCOUNTER — Encounter: Payer: Self-pay | Admitting: Emergency Medicine

## 2018-02-22 DIAGNOSIS — J029 Acute pharyngitis, unspecified: Secondary | ICD-10-CM | POA: Insufficient documentation

## 2018-02-22 DIAGNOSIS — F17210 Nicotine dependence, cigarettes, uncomplicated: Secondary | ICD-10-CM | POA: Insufficient documentation

## 2018-02-22 DIAGNOSIS — Z79899 Other long term (current) drug therapy: Secondary | ICD-10-CM | POA: Diagnosis not present

## 2018-02-22 DIAGNOSIS — H9202 Otalgia, left ear: Secondary | ICD-10-CM | POA: Insufficient documentation

## 2018-02-22 DIAGNOSIS — I10 Essential (primary) hypertension: Secondary | ICD-10-CM | POA: Insufficient documentation

## 2018-02-22 DIAGNOSIS — B349 Viral infection, unspecified: Secondary | ICD-10-CM | POA: Diagnosis not present

## 2018-02-22 NOTE — ED Triage Notes (Signed)
Pt arrives ambulatory to triage with c/o sore throat x 2 days and left earache today. Pt is in NAD.

## 2018-02-23 ENCOUNTER — Emergency Department
Admission: EM | Admit: 2018-02-23 | Discharge: 2018-02-23 | Disposition: A | Discharge status: Home / Self Care | Payer: Medicaid Other | Attending: Emergency Medicine | Admitting: Emergency Medicine

## 2018-02-23 ENCOUNTER — Emergency Department: Payer: Medicaid Other

## 2018-02-23 DIAGNOSIS — B349 Viral infection, unspecified: Secondary | ICD-10-CM

## 2018-02-23 DIAGNOSIS — H9202 Otalgia, left ear: Secondary | ICD-10-CM

## 2018-02-23 DIAGNOSIS — J029 Acute pharyngitis, unspecified: Secondary | ICD-10-CM

## 2018-02-23 LAB — GROUP A STREP BY PCR: Group A Strep by PCR: NOT DETECTED

## 2018-02-23 MED ORDER — DEXAMETHASONE 10 MG/ML FOR PEDIATRIC ORAL USE
10.0000 mg | Freq: Once | INTRAMUSCULAR | Status: AC
  Administered 2018-02-23: 10 mg via ORAL

## 2018-02-23 MED ORDER — DEXAMETHASONE SODIUM PHOSPHATE 10 MG/ML IJ SOLN
INTRAMUSCULAR | Status: AC
  Administered 2018-02-23: 10 mg via ORAL
  Filled 2018-02-23: qty 1

## 2018-02-23 NOTE — ED Notes (Signed)
Pt states has been having pain on left jaw, neck, and ear. Pt states that this has been going on for the last 3 days and progressively gotten worse.

## 2018-02-23 NOTE — ED Provider Notes (Signed)
[facility] Emergency Department Provider Note  ____________________________________________   First MD Initiated Contact with Patient 02/23/18 0129     (approximate)  I have reviewed the triage vital signs and the nursing notes.   HISTORY  Chief Complaint Sore Throat    HPI Manuel Harmon is a 47 y.o. male with medical history as listed below who presents for evaluation of about 2 days of left-sided throat pain that is now radiating into his left ear.  He is also had some nasal congestion, runny nose, mild intermittent headache, and a mild cough.  He smokes tobacco but does not drink alcohol or use drugs.  He is status post tonsillectomy.  He has no dental pain or dental issues of which she is aware.  Swallowing things makes the pain worse and nothing in particular makes it better.  He is not having any difficulty swallowing or speaking and does not have a muffled voice.  He denies fever/chills, chest pain, shortness of breath, nausea, vomiting, and dysuria.  He reports that the pain is at least moderate if not severe.  Past Medical History:  Diagnosis Date  . Anxiety and depression   . Depression   . Diverticulosis of colon   . History of diverticulitis of colon   . Hypertension   . UTI (lower urinary tract infection)     Patient Active Problem List   Diagnosis Date Noted  . Colon cancer screening   . Tonsillopharyngitis 08/04/2017  . HTN (hypertension) 03/23/2015  . Tobacco use 03/23/2015  . Depression 03/13/2014  . 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 Anxiety disorder 03/13/2014  . PTSD (post-traumatic stress disorder) 03/13/2014  . Cocaine use disorder, mild, abuse (HCC) 03/13/2014  . Cannabis use disorder, moderate, dependence (HCC) 03/13/2014  . Suture reaction 01/15/2014    Past Surgical History:  Procedure Laterality Date  . COLONOSCOPY WITH PROPOFOL N/A 12/06/2017   Procedure:  COLONOSCOPY WITH PROPOFOL;  Surgeon: Toney ReilVanga, Rohini Reddy, MD;  [facility];  Service: Gastroenterology;  Laterality: N/A;  . LEFT COLECTOMY AND APPENDECTOMY  05-31-2007  . RIGHT ORCHIOPEXY INGUINAL APPROACH AND HYDROCELECTOMY  1989  . SCAR REVISION N/A 01/15/2014   Procedure: SCAR REVISION AND REMOVAL OF SUTURE MATERIAL FROM ABDOMINAL WALL;  Surgeon: Darnell Levelodd Gerkin, MD;  [facility];  Service: General;  Laterality: N/A;  . TONSILLECTOMY AND ADENOIDECTOMY N/A 09/11/2017   Procedure: TONSILLECTOMY AND POSSIBLE ADENOIDECTOMY;  Surgeon: Geanie LoganBennett, Paul, MD;  [facility];  Service: ENT;  Laterality: N/A;  NO ADENOID TISSUE PRESENT PER MD    Prior to Admission medications   Medication Sig Start Date End Date Taking? Authorizing Provider  clonazePAM (KLONOPIN) 1 MG tablet Take 1 mg by mouth at bedtime.    [provider]  lisinopril (PRINIVIL,ZESTRIL) 20 MG tablet Take by mouth. 03/17/14   [provider]  meloxicam (MOBIC) 7.5 MG tablet Take by mouth. 01/18/16   [provider]  mirtazapine (REMERON) 30 MG tablet  07/28/15   [provider]  mirtazapine (REMERON) 45 MG tablet Take 45 mg by mouth at bedtime.    [provider]  traMADol (ULTRAM) 50 MG tablet Take by mouth. 08/17/15   [provider]  triamcinolone cream (KENALOG) 0.1 % Apply 1 application topically 2 (two) times daily as needed.    [provider]    Allergies Ibuprofen; Penicillins; and Wellbutrin [bupropion]  Family History  Problem Relation Age of  Onset  . Drug abuse Father   . Prostate cancer Paternal Grandfather   . Bladder Cancer Neg Hx   . Kidney cancer Neg Hx     Social History Social History   Tobacco Use  . Smoking status: Current Every Day Smoker    Packs/day: 1.00    Years: 33.00    Pack years: 33.00    Types: Cigarettes  . Smokeless tobacco: Never Used  Substance Use Topics  . Alcohol use: Yes     Alcohol/week: 2.0 standard drinks    Types: 2 Standard drinks or equivalent per week  . Drug use: No    Review of Systems Constitutional: No fever/chills Eyes: No visual changes. ENT: Left-sided sore throat radiating to his ear as described above.  Recent nasal congestion/runny nose. Cardiovascular: Denies chest pain. Respiratory: Denies shortness of breath.  Recent mild cough. Gastrointestinal: No abdominal pain.  No nausea, no vomiting.  No diarrhea.  No constipation. Genitourinary: Negative for dysuria. Musculoskeletal: Negative for neck pain.  Negative for back pain. Integumentary: Negative for rash. Neurological: Negative for headaches, focal weakness or numbness.   ____________________________________________   PHYSICAL EXAM:  VITAL SIGNS: ED Triage Vitals  Enc Vitals Group     BP 02/22/18 2347 132/88     Pulse Rate 02/22/18 2347 93     Resp 02/22/18 2347 18     Temp 02/22/18 2347 97.8 F (36.6 C)     Temp Source 02/22/18 2347 Oral     SpO2 02/22/18 2347 96 %     Weight 02/22/18 2348 86.2 kg (190 lb)     Height 02/22/18 2348 1.778 m (5\' 10" )     Head Circumference --      Peak Flow --      Pain Score 02/22/18 2348 6     Pain Loc --      Pain Edu? --      Excl. in GC? --     Constitutional: Alert and oriented. Well appearing and in no acute distress. Eyes: Conjunctivae are normal. PERRL. EOMI. Head: Atraumatic. Ears:  Healthy appearing ear canals and TMs bilaterally.  No mastoid tenderness, no pain with manipulation of auricles. Nose: No congestion/rhinnorhea. Mouth/Throat: Mucous membranes are moist.  Oropharynx non-erythematous.  No petechiae nor exudate.  No evidence of swelling suggestive of a peritonsillar abscess.  Tonsils are surgically absent. Neck: No stridor.  No meningeal signs.  Mild tenderness to palpation of the left side of the neck in the mid left submandibular region.  No palpable lymphadenopathy.  No fluctuance nor induration. Cardiovascular:  Normal rate, regular rhythm. Good peripheral circulation. Respiratory: Normal respiratory effort.  No retractions. Lungs CTAB. Gastrointestinal: Soft and nontender. No distention.  Musculoskeletal: No lower extremity tenderness nor edema. No gross deformities of extremities. Neurologic:  Normal speech and language. No gross focal neurologic deficits are appreciated.  Skin:  Skin is warm, dry and intact. No rash noted. Psychiatric: Mood and affect are normal. Speech and behavior are normal.  ____________________________________________   LABS (all labs ordered are listed, but only abnormal results are displayed)  Labs Reviewed  GROUP A STREP BY PCR   ____________________________________________  EKG  No indication for EKG ____________________________________________  RADIOLOGY I, Loleta Rose, personally viewed and evaluated these images (plain radiographs) as part of my medical decision making, as well as reviewing the written report by the radiologist.  ED MD interpretation:  No acute abnormalities  Official radiology report(s): Dg Neck Soft Tissue  Result Date: 02/23/2018 CLINICAL DATA:  Left throat pain radiating to the ear. EXAM: NECK SOFT TISSUES - 1+ VIEW COMPARISON:  Soft tissue neck CT 08/03/2017 FINDINGS: There is no evidence of retropharyngeal soft tissue swelling or epiglottic enlargement. The cervical airway is unremarkable. Soft tissue planes are non suspicious. No radio-opaque foreign body identified. Mild degenerative change in the cervical spine. IMPRESSION: Negative soft tissue neck radiographs. Electronically Signed   By: Narda Rutherford M.D.   On: 02/23/2018 02:06    ____________________________________________   PROCEDURES  Critical Care performed: No   Procedure(s) performed:   Procedures   ____________________________________________   INITIAL IMPRESSION / ASSESSMENT AND PLAN / ED COURSE  As part of my medical decision making, I reviewed the  following data within the electronic MEDICAL RECORD NUMBER Nursing notes reviewed and incorporated and Radiograph reviewed and labs reviewed    Diff dx includes viral illness, otitis media, otitis externa, strep throat, retropharyngeal infection, peritonsillar abscess, odontogenic abscess.  The patient has no evidence of dental infection at this time.  He has no cervical lymphadenopathy or palpable induration or fluctuance.  He has a reassuring oropharyngeal exam and is status post tonsillectomy.  There is no evidence of peritonsillar abscess.  His strep test is negative.  I obtained radiographs to attempt to identify any suggestion of deep tissue/retropharyngeal infection or epiglottitis and his results were normal.  He is in no distress and sleeping.  Clinical Course as of Feb 24 307  Sat Feb 23, 2018  0120 Group A Strep by PCR: NOT DETECTED [CF]  0240 The soft tissue neck radiographs were normal with no sign of epiglottitis or other evidence of soft tissue infection.  The patient has been sleeping comfortably and is in no distress.  I am giving a one-time dose of Decadron 10 mg by mouth and he will follow-up as an outpatient.  Oral NSAIDs and Tylenol as needed for pain.  I gave my usual and customary return precautions.   [CF]    Clinical Course User Index [CF] Loleta Rose, MD    ____________________________________________  FINAL CLINICAL IMPRESSION(S) / ED DIAGNOSES  Final diagnoses:  Sore throat  Acute otalgia, left  Viral illness     MEDICATIONS GIVEN DURING THIS VISIT:  Medications  dexamethasone (DECADRON) 10 MG/ML injection for Pediatric ORAL use 10 mg (10 mg Oral Given 02/23/18 0248)     ED Discharge Orders    None       Note:  This document was prepared using Dragon voice recognition software and may include unintentional dictation errors.    Loleta Rose, MD 02/23/18 [phone]

## 2018-02-23 NOTE — Discharge Instructions (Signed)
You have been seen in the Emergency Department (ED) today for a likely viral illness.  Please drink plenty of clear fluids (water, Gatorade, chicken broth, etc).  You may use Tylenol and/or Motrin according to label instructions.  You can alternate between the two without any side effects. You were given a one-time dose of medication called Decadron that should help with the pain and swelling over the next couple of days.  Please follow up with your doctor as listed above.  Call your doctor or return to the Emergency Department (ED) if you are unable to tolerate fluids due to vomiting, have worsening trouble breathing, become extremely tired or difficult to awaken, or if you develop any other symptoms that concern you.

## 2018-06-06 ENCOUNTER — Other Ambulatory Visit: Payer: Self-pay

## 2018-06-06 ENCOUNTER — Emergency Department
Admission: EM | Admit: 2018-06-06 | Discharge: 2018-06-06 | Disposition: A | Discharge status: Home / Self Care | Payer: Medicaid Other | Attending: Emergency Medicine | Admitting: Emergency Medicine

## 2018-06-06 DIAGNOSIS — I10 Essential (primary) hypertension: Secondary | ICD-10-CM | POA: Insufficient documentation

## 2018-06-06 DIAGNOSIS — F17210 Nicotine dependence, cigarettes, uncomplicated: Secondary | ICD-10-CM | POA: Diagnosis not present

## 2018-06-06 DIAGNOSIS — Z79899 Other long term (current) drug therapy: Secondary | ICD-10-CM | POA: Diagnosis not present

## 2018-06-06 DIAGNOSIS — G8918 Other acute postprocedural pain: Secondary | ICD-10-CM | POA: Diagnosis present

## 2018-06-06 MED ORDER — OXYCODONE-ACETAMINOPHEN 5-325 MG PO TABS: 1.0000 | ORAL_TABLET | ORAL | 0 refills | Status: DC | PRN

## 2018-06-06 MED ORDER — OXYCODONE-ACETAMINOPHEN 5-325 MG PO TABS
1.0000 | ORAL_TABLET | Freq: Once | ORAL | Status: AC
  Administered 2018-06-06: 1 via ORAL
  Filled 2018-06-06: qty 1

## 2018-06-06 NOTE — ED Triage Notes (Signed)
Pt had both great toe nails removed Monday, here for worsening pain.

## 2018-06-06 NOTE — Discharge Instructions (Signed)
You may take Percocet as needed for pain.  Return to the ER for worsening symptoms, redness/swelling, purulent discharge or other concerns.

## 2018-06-06 NOTE — ED Provider Notes (Signed)
[facility] Emergency Department Provider Note   ____________________________________________   First MD Initiated Contact with Patient 06/06/18 0423     (approximate)  I have reviewed the triage vital signs and the nursing notes.   HISTORY  Chief Complaint Post-op Problem    HPI Manuel Harmon is a 48 y.o. male who presents to the ED from home with a chief complaint of postoperative pain.  Patient had bilateral matrixectomy performed of his hallux nails on 06/03/2018.  Complains of postoperative pain.  States he was not discharged on any medicines for pain.  Denies associated fever, chills, cough, congestion, chest pain, shortness of breath, abdominal pain, nausea or vomiting.  Denies recent travel or trauma.  Has not been exposed to persons diagnosed with coronavirus.     Past Medical History:  Diagnosis Date  . Anxiety and depression   . Depression   . Diverticulosis of colon   . History of diverticulitis of colon   . Hypertension   . UTI (lower urinary tract infection)     Patient Active Problem List   Diagnosis Date Noted  . Colon cancer screening   . Tonsillopharyngitis 08/04/2017  . HTN (hypertension) 03/23/2015  . Tobacco use 03/23/2015  . Depression 03/13/2014  . 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 Anxiety disorder 03/13/2014  . PTSD (post-traumatic stress disorder) 03/13/2014  . Cocaine use disorder, mild, abuse (HCC) 03/13/2014  . Cannabis use disorder, moderate, dependence (HCC) 03/13/2014  . Suture reaction 01/15/2014    Past Surgical History:  Procedure Laterality Date  . COLONOSCOPY WITH PROPOFOL N/A 12/06/2017   Procedure: COLONOSCOPY WITH PROPOFOL;  Surgeon: Toney Reil, MD;  Location: [facility] ENDOSCOPY;  Service: Gastroenterology;  Laterality: N/A;  . LEFT COLECTOMY AND APPENDECTOMY  05-31-2007  . RIGHT ORCHIOPEXY INGUINAL APPROACH AND HYDROCELECTOMY  1989  . SCAR  REVISION N/A 01/15/2014   Procedure: SCAR REVISION AND REMOVAL OF SUTURE MATERIAL FROM ABDOMINAL WALL;  Surgeon: Darnell Level, MD;  Location: [facility];  Service: General;  Laterality: N/A;  . TONSILLECTOMY AND ADENOIDECTOMY N/A 09/11/2017   Procedure: TONSILLECTOMY AND POSSIBLE ADENOIDECTOMY;  Surgeon: Geanie Logan, MD;  [facility];  Service: ENT;  Laterality: N/A;  NO ADENOID TISSUE PRESENT PER MD    Prior to Admission medications   Medication Sig Start Date End Date Taking? Authorizing Provider  clonazePAM (KLONOPIN) 1 MG tablet Take 1 mg by mouth at bedtime.    [provider]  lisinopril (PRINIVIL,ZESTRIL) 20 MG tablet Take by mouth. 03/17/14   [provider]  meloxicam (MOBIC) 7.5 MG tablet Take by mouth. 01/18/16   [provider]  mirtazapine (REMERON) 30 MG tablet  07/28/15   [provider]  mirtazapine (REMERON) 45 MG tablet Take 45 mg by mouth at bedtime.    [provider]  oxyCODONE-acetaminophen (PERCOCET/ROXICET) 5-325 MG tablet Take 1 tablet by mouth every 4 (four) hours as needed for severe pain. 06/06/18   Irean Hong, MD  traMADol (ULTRAM) 50 MG tablet Take by mouth. 08/17/15   [provider]  triamcinolone cream (KENALOG) 0.1 % Apply 1 application topically 2 (two) times daily as needed.    [provider]    Allergies Ibuprofen; Penicillins; and Wellbutrin [bupropion]  Family History  Problem Relation Age of Onset  . Drug abuse Father   . Prostate cancer Paternal Grandfather   . Bladder Cancer Neg Hx   . Kidney cancer Neg  Hx     Social History Social History   Tobacco Use  . Smoking status: Current Every Day Smoker    Packs/day: 1.00    Years: 33.00    Pack years: 33.00    Types: Cigarettes  . Smokeless tobacco: Never Used  Substance Use Topics  . Alcohol use: Yes    Alcohol/week: 2.0 standard drinks    Types: 2 Standard drinks or equivalent per week  . Drug  use: No    Review of Systems  Constitutional: No fever/chills Eyes: No visual changes. ENT: No sore throat. Cardiovascular: Denies chest pain. Respiratory: Denies shortness of breath. Gastrointestinal: No abdominal pain.  No nausea, no vomiting.  No diarrhea.  No constipation. Genitourinary: Negative for dysuria. Musculoskeletal: Positive for bilateral great toe pain.  Negative for back pain. Skin: Negative for rash. Neurological: Negative for headaches, focal weakness or numbness.   ____________________________________________   PHYSICAL EXAM:  VITAL SIGNS: ED Triage Vitals  Enc Vitals Group     BP 06/06/18 0409 (!) 111/44     Pulse Rate 06/06/18 0409 76     Resp 06/06/18 0409 20     Temp 06/06/18 0409 98.3 F (36.8 C)     Temp Source 06/06/18 0409 Oral     SpO2 06/06/18 0409 98 %     Weight 06/06/18 0408 187 lb (84.8 kg)     Height 06/06/18 0408 5\' 10"  (1.778 m)     Head Circumference --      Peak Flow --      Pain Score 06/06/18 0408 10     Pain Loc --      Pain Edu? --      Excl. in GC? --     Constitutional: Alert and oriented. Well appearing and in mild to moderate acute distress. Eyes: Conjunctivae are normal. PERRL. EOMI. Head: Atraumatic. Nose: No congestion/rhinnorhea. Mouth/Throat: Mucous membranes are moist.  Oropharynx non-erythematous. Neck: No stridor.   Cardiovascular: Normal rate, regular rhythm. Grossly normal heart sounds.  Good peripheral circulation. Respiratory: Normal respiratory effort.  No retractions. Lungs CTAB. Gastrointestinal: Soft and nontender. No distention. No abdominal bruits. No CVA tenderness. Musculoskeletal: Bandages removed from bilateral great toes.  Post operative site is clean/dry/intact and looks good.  There is no warmth, erythema or fluctuance. Neurologic:  Normal speech and language. No gross focal neurologic deficits are appreciated. No gait instability. Skin:  Skin is warm, dry and intact. No rash noted.  Psychiatric: Mood and affect are normal. Speech and behavior are normal.  ____________________________________________   LABS (all labs ordered are listed, but only abnormal results are displayed)  Labs Reviewed - No data to display ____________________________________________  EKG  None ____________________________________________  RADIOLOGY  ED MD interpretation: None  Official radiology report(s): No results found.  ____________________________________________   PROCEDURES  Procedure(s) performed (including Critical Care):  Procedures   ____________________________________________   INITIAL IMPRESSION / ASSESSMENT AND PLAN / ED COURSE  As part of my medical decision making, I reviewed the following data within the electronic MEDICAL RECORD NUMBER Nursing notes reviewed and incorporated, Old chart reviewed and Notes from prior ED visits        48 year old male who presents with postoperative pain following bilateral matrixectomy of his great toenails.  Postoperative sites are healing without evidence for infection.  Patient is allergic to ibuprofen; will prescribe Percocet for pain.  Patient has follow-up appointment with his surgeon scheduled.  Strict return precautions given.  Patient verbalizes understanding and agrees with plan of  care.      ____________________________________________   FINAL CLINICAL IMPRESSION(S) / ED DIAGNOSES  Final diagnoses:  Post-operative pain     ED Discharge Orders         Ordered    oxyCODONE-acetaminophen (PERCOCET/ROXICET) 5-325 MG tablet  Every 4 hours PRN     06/06/18 0435           Note:  This document was prepared using Dragon voice recognition software and may include unintentional dictation errors.   Irean Hong, MD 06/06/18 [phone]

## 2018-09-16 ENCOUNTER — Other Ambulatory Visit: Payer: Self-pay

## 2018-09-16 ENCOUNTER — Emergency Department
Admission: EM | Admit: 2018-09-16 | Discharge: 2018-09-16 | Disposition: A | Discharge status: Home / Self Care | Payer: Medicaid Other | Attending: Emergency Medicine | Admitting: Emergency Medicine

## 2018-09-16 ENCOUNTER — Encounter: Payer: Self-pay | Admitting: Emergency Medicine

## 2018-09-16 DIAGNOSIS — I10 Essential (primary) hypertension: Secondary | ICD-10-CM | POA: Diagnosis not present

## 2018-09-16 DIAGNOSIS — B349 Viral infection, unspecified: Secondary | ICD-10-CM | POA: Insufficient documentation

## 2018-09-16 DIAGNOSIS — F17210 Nicotine dependence, cigarettes, uncomplicated: Secondary | ICD-10-CM | POA: Diagnosis not present

## 2018-09-16 DIAGNOSIS — K2900 Acute gastritis without bleeding: Secondary | ICD-10-CM | POA: Insufficient documentation

## 2018-09-16 DIAGNOSIS — R110 Nausea: Secondary | ICD-10-CM | POA: Diagnosis present

## 2018-09-16 DIAGNOSIS — Z20828 Contact with and (suspected) exposure to other viral communicable diseases: Secondary | ICD-10-CM | POA: Insufficient documentation

## 2018-09-16 LAB — CBC
HCT: 47.4 % (ref 39.0–52.0)
Hemoglobin: 15.9 g/dL (ref 13.0–17.0)
MCH: 28.5 pg (ref 26.0–34.0)
MCHC: 33.5 g/dL (ref 30.0–36.0)
MCV: 85.1 fL (ref 80.0–100.0)
Platelets: 314 10*3/uL (ref 150–400)
RBC: 5.57 MIL/uL (ref 4.22–5.81)
RDW: 14.7 % (ref 11.5–15.5)
WBC: 7.8 10*3/uL (ref 4.0–10.5)
nRBC: 0 % (ref 0.0–0.2)

## 2018-09-16 LAB — COMPREHENSIVE METABOLIC PANEL
ALT: 14 U/L (ref 0–44)
AST: 19 U/L (ref 15–41)
Albumin: 4.4 g/dL (ref 3.5–5.0)
Alkaline Phosphatase: 69 U/L (ref 38–126)
Anion gap: 8 (ref 5–15)
BUN: 17 mg/dL (ref 6–20)
CO2: 22 mmol/L (ref 22–32)
Calcium: 9.2 mg/dL (ref 8.9–10.3)
Chloride: 107 mmol/L (ref 98–111)
Creatinine, Ser: 1.3 mg/dL — ABNORMAL HIGH (ref 0.61–1.24)
GFR calc Af Amer: >60 mL/min (ref >60)
GFR calc non Af Amer: >60 mL/min (ref >60)
Glucose, Bld: 102 mg/dL — ABNORMAL HIGH (ref 70–99)
Potassium: 4.3 mmol/L (ref 3.5–5.1)
Sodium: 137 mmol/L (ref 135–145)
Total Bilirubin: 0.6 mg/dL (ref 0.3–1.2)
Total Protein: 7.6 g/dL (ref 6.5–8.1)

## 2018-09-16 LAB — URINALYSIS, COMPLETE (UACMP) WITH MICROSCOPIC
Bacteria, UA: NONE SEEN
Bilirubin Urine: NEGATIVE
Glucose, UA: NEGATIVE mg/dL
Ketones, ur: NEGATIVE mg/dL
Leukocytes,Ua: NEGATIVE
Nitrite: NEGATIVE
Protein, ur: NEGATIVE mg/dL
Specific Gravity, Urine: 1.02 (ref 1.005–1.030)
Squamous Epithelial / LPF: NONE SEEN (ref 0–5)
pH: 5 (ref 5.0–8.0)

## 2018-09-16 LAB — LIPASE, BLOOD: Lipase: 41 U/L (ref 11–51)

## 2018-09-16 MED ORDER — FAMOTIDINE 20 MG PO TABS: 20.0000 mg | ORAL_TABLET | Freq: Two times a day (BID) | ORAL | 1 refills | Status: DC

## 2018-09-16 MED ORDER — HYDROCOD POLST-CPM POLST ER 10-8 MG/5ML PO SUER: 5.0000 mL | Freq: Two times a day (BID) | ORAL | 0 refills | Status: DC

## 2018-09-16 NOTE — ED Triage Notes (Signed)
Patient presents to the ED with cough and congestion x 1 week, lower rib and upper abdominal pain, feeling weak and tired.  Patient states he is unsure if he has had a fever.  Patient states, "I've got aches and pains throughout my stomach."  Patient reports occasional episodes of diarrhea and nausea.  Denies vomiting.

## 2018-09-16 NOTE — ED Provider Notes (Signed)
[facility] Emergency Department Provider Note       Time seen: ----------------------------------------- 4:40 PM on 09/16/2018 -----------------------------------------   I have reviewed the triage vital signs and the nursing notes.  HISTORY   Chief Complaint Abdominal Pain and Croup   HPI Manuel Harmon is a 48 y.o. male with a history of anxiety, depression, hypertension, UTI who presents to the ED for cough and congestion over the last week.  He has had some lower rib and upper abdominal pain, feeling weak and tired.  He is unsure if he has had a fever he reports aches and pains throughout his stomach, reports recent alcohol intake due to the 4 July.  He has had occasional diarrhea and nausea, denies any vomiting.  Past Medical History:  Diagnosis Date  . Anxiety and depression   . Depression   . Diverticulosis of colon   . History of diverticulitis of colon   . Hypertension   . UTI (lower urinary tract infection)     Patient Active Problem List   Diagnosis Date Noted  . Colon cancer screening   . Tonsillopharyngitis 08/04/2017  . HTN (hypertension) 03/23/2015  . Tobacco use 03/23/2015  . Depression 03/13/2014  . 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 Anxiety disorder 03/13/2014  . PTSD (post-traumatic stress disorder) 03/13/2014  . Cocaine use disorder, mild, abuse (HCC) 03/13/2014  . Cannabis use disorder, moderate, dependence (HCC) 03/13/2014  . Suture reaction 01/15/2014    Past Surgical History:  Procedure Laterality Date  . COLONOSCOPY WITH PROPOFOL N/A 12/06/2017   Procedure: COLONOSCOPY WITH PROPOFOL;  Surgeon: Toney ReilVanga, Rohini Reddy, MD;  Location: [facility] END[facility];  Service: Gastroenterology;  Laterality: N/A;  . LEFT COLECTOMY AND APPENDECTOMY  05-31-2007  . RIGHT ORCHIOPEXY INGUINAL APPROACH AND HYDROCELECTOMY  1989  . SCAR REVISION N/A 01/15/2014   Procedure: SCAR REVISION AND REMOVAL  OF SUTURE MATERIAL FROM ABDOMINAL WALL;  Surgeon: Darnell Levelodd Gerkin, MD;  [facility];  Service: General;  Laterality: N/A;  . TONSILLECTOMY AND ADENOIDECTOMY N/A 09/11/2017   Procedure: TONSILLECTOMY AND POSSIBLE ADENOIDECTOMY;  Surgeon: Geanie LoganBennett, Paul, MD;  [facility];  Service: ENT;  Laterality: N/A;  NO ADENOID TISSUE PRESENT PER MD    Allergies Ibuprofen, Penicillins, and Wellbutrin [bupropion]  Social History Social History   Tobacco Use  . Smoking status: Current Every Day Smoker    Packs/day: 1.00    Years: 33.00    Pack years: 33.00    Types: Cigarettes  . Smokeless tobacco: Never Used  Substance Use Topics  . Alcohol use: Yes    Alcohol/week: 2.0 standard drinks    Types: 2 Standard drinks or equivalent per week  . Drug use: No   Review of Systems Constitutional: Negative for fever. HEENT: Positive for congestion Cardiovascular: Negative for chest pain. Respiratory: Positive for cough Gastrointestinal: Negative for abdominal pain, positive for diarrhea and nausea Musculoskeletal: Negative for back pain. Skin: Negative for rash. Neurological: Negative for headaches, focal weakness or numbness.  All systems negative/normal/unremarkable except as stated in the HPI  ____________________________________________   PHYSICAL EXAM:  VITAL SIGNS: ED Triage Vitals [09/16/18 1225]  Enc Vitals Group     BP (!) 150/95     Pulse Rate 75     Resp 18     Temp 98.9 F (37.2 C)     Temp Source Oral     SpO2 100 %     Weight 190 lb (86.2  kg)     Height 5\' 10"  (1.778 m)     Head Circumference      Peak Flow      Pain Score 7     Pain Loc      Pain Edu?      Excl. in [place]?    Constitutional: Alert and oriented. Well appearing and in no distress. Eyes: Conjunctivae are normal. Normal extraocular movements. Cardiovascular: Normal rate, regular rhythm. No murmurs, rubs, or gallops. Respiratory: Normal respiratory effort without  tachypnea nor retractions. Breath sounds are clear and equal bilaterally. No wheezes/rales/rhonchi. Gastrointestinal: Mild epigastric tenderness, no rebound or guarding.  Normal bowel sounds. Musculoskeletal: Nontender with normal range of motion in extremities. No lower extremity tenderness nor edema. Neurologic:  Normal speech and language. No gross focal neurologic deficits are appreciated.  Skin:  Skin is warm, dry and intact. No rash noted. Psychiatric: Mood and affect are normal. Speech and behavior are normal.  ____________________________________________  ED COURSE:  As part of my medical decision making, I reviewed the following data within the [place] History obtained from family if available, nursing notes, old chart and ekg, as well as notes from prior ED visits. Patient presented for multiple complaints, we will assess with labs and imaging as indicated at this time.   Procedures  Manuel Harmon was evaluated in Emergency Department on 09/16/2018 for the symptoms described in the history of present illness. He was evaluated in the context of the global COVID-19 pandemic, which necessitated consideration that the patient might be at risk for infection with the SARS-CoV-2 virus that causes COVID-19. Institutional protocols and algorithms that pertain to the evaluation of patients at risk for COVID-19 are in a state of rapid change based on information released by regulatory bodies including the CDC and federal and state organizations. These policies and algorithms were followed during the patient's care in the ED.  ____________________________________________   LABS (pertinent positives/negatives)  Labs Reviewed  COMPREHENSIVE METABOLIC PANEL - Abnormal; Notable for the following components:      Result Value   Glucose, Bld 102 (*)    Creatinine, Ser 1.30 (*)    All other components within normal limits  URINALYSIS, COMPLETE (UACMP) WITH MICROSCOPIC - Abnormal;  Notable for the following components:   Color, Urine YELLOW (*)    APPearance CLEAR (*)    Hgb urine dipstick SMALL (*)    All other components within normal limits  NOVEL CORONAVIRUS, NAA (HOSPITAL ORDER, SEND-OUT TO REF LAB)  LIPASE, BLOOD  CBC  ____________________________________________   DIFFERENTIAL DIAGNOSIS   Viral illness, dehydration, gastritis, peptic ulcer disease, pancreatitis  FINAL ASSESSMENT AND PLAN  Viral illness, gastritis   Plan: The patient had presented for likely combination of viral illness and gastritis. Patient's labs revealed a mildly elevated creatinine but otherwise unremarkable labs.  He did not require any imaging at this time.  We have performed the outpatient coronavirus test and I have advised him to quarantine until he knows the test results.  He will be given symptomatic treatment and Pepcid otherwise.   Laurence Aly, MD    Note: This note was generated in part or whole with voice recognition software. Voice recognition is usually quite accurate but there are transcription errors that can and very often do occur. I apologize for any typographical errors that were not detected and corrected.     Earleen Newport, MD 09/16/18 [phone]

## 2018-09-17 LAB — NOVEL CORONAVIRUS, NAA (HOSP ORDER, SEND-OUT TO REF LAB; TAT 18-24 HRS): SARS-CoV-2, NAA: NOT DETECTED

## 2018-09-19 ENCOUNTER — Telehealth: Payer: Self-pay | Admitting: Emergency Medicine

## 2018-09-19 NOTE — Telephone Encounter (Signed)
called patient to infrom of negative covid 19 test.  No answer and no voicemail.

## 2018-09-25 ENCOUNTER — Other Ambulatory Visit
Admission: RE | Admit: 2018-09-25 | Discharge: 2018-09-25 | Disposition: A | Discharge status: Home / Self Care | Payer: Medicaid Other | Source: Ambulatory Visit | Attending: Orthopedic Surgery | Admitting: Orthopedic Surgery

## 2018-09-25 ENCOUNTER — Other Ambulatory Visit: Payer: Self-pay

## 2018-09-25 DIAGNOSIS — Z1159 Encounter for screening for other viral diseases: Secondary | ICD-10-CM | POA: Insufficient documentation

## 2018-09-26 ENCOUNTER — Encounter
Admission: RE | Admit: 2018-09-26 | Discharge: 2018-09-26 | Disposition: A | Discharge status: Home / Self Care | Payer: Medicaid Other | Source: Ambulatory Visit | Attending: Orthopedic Surgery | Admitting: Orthopedic Surgery

## 2018-09-26 ENCOUNTER — Other Ambulatory Visit: Payer: Self-pay

## 2018-09-26 HISTORY — DX: Gastro-esophageal reflux disease without esophagitis: K21.9

## 2018-09-26 LAB — SARS CORONAVIRUS 2 (TAT 6-24 HRS): SARS Coronavirus 2: NEGATIVE

## 2018-09-26 NOTE — Patient Instructions (Signed)
Your procedure is scheduled on: 09-27-18 Report to Same Day Surgery 2nd floor medical mall [facility](Medical Mall Entrance-take elevator on left to 2nd floor.  Check in with surgery information desk.) To find out your arrival time please call [phone] between 1PM - 3PM on 09-26-18  Remember: Instructions that are not followed completely may result in serious medical risk, up to and including death, or upon the discretion of your surgeon and anesthesiologist your surgery may need to be rescheduled.    _x___ 1. Do not eat food after midnight the night before your procedure. You may drink clear liquids up to 2 hours before you are scheduled to arrive at the hospital for your procedure.  Do not drink clear liquids within 2 hours of your scheduled arrival to the hospital.  Clear liquids include  --Water or Apple juice without pulp  --Clear carbohydrate beverage such as ClearFast or Gatorade  --Black Coffee or Clear Tea (No milk, no creamers, do not add anything to the coffee or Tea   ____Ensure clear carbohydrate drink on the way to the hospital for bariatric patients  ____Ensure clear carbohydrate drink 3 hours before surgery for Dr Rutherford NailByrnett's patients if physician instructed.   No gum chewing or hard candies.     __x__ 2. No Alcohol for 24 hours before or after surgery.   __x__3. No Smoking or e-cigarettes for 24 prior to surgery.  Do not use any chewable tobacco products for at least 6 hour prior to surgery   ____  4. Bring all medications with you on the day of surgery if instructed.    __x__ 5. Notify your doctor if there is any change in your medical condition     (cold, fever, infections).    x___6. On the morning of surgery brush your teeth with toothpaste and water.  You may rinse your mouth with mouth wash if you wish.  Do not swallow any toothpaste or mouthwash.   Do not wear jewelry, make-up, hairpins, clips or nail polish.  Do not wear lotions, powders, or perfumes. You may wear  deodorant.  Do not shave 48 hours prior to surgery. Men may shave face and neck.  Do not bring valuables to the hospital.    [facility] is not responsible for any belongings or valuables.               Contacts, dentures or bridgework may not be worn into surgery.  Leave your suitcase in the car. After surgery it may be brought to your room.  For patients admitted to the hospital, discharge time is determined by your treatment team.  _  Patients discharged the day of surgery will not be allowed to drive home.  You will need someone to drive you home and stay with you the night of your procedure.    Please read over the following fact sheets that you were given:   [facility] Preparing for [facility]   _x___ TAKE THE FOLLOWING MEDICATION THE MORNING OF SURGERY WITH A SMALL SIP OF WATER. These include:  1. PEPCID  2. TAKE A PEPCID TONIGHT BEFORE BED  3.  4.  5.  6.  ____Fleets enema or Magnesium Citrate as directed.   ____ Use CHG Soap or sage wipes as directed on instruction sheet   ____ Use inhalers on the day of surgery and bring to hospital day of surgery  ____ Stop Metformin and Janumet 2 days prior to surgery.    ____ Take 1/2 of usual  insulin dose the night before surgery and none on the morning surgery.   ____ Follow recommendations from Cardiologist, Pulmonologist or PCP regarding stopping Aspirin, Coumadin, Plavix ,Eliquis, Effient, or Pradaxa, and Pletal.  X____Stop Anti-inflammatories such as Advil, Aleve, Ibuprofen, Motrin, Naproxen, Naprosyn, Goodies powders or aspirin products NOW-OK to take Tylenol OR TRAMADOL IF NEEDED   ____ Stop supplements until after surgery.   ____ Bring C-Pap to the hospital.

## 2018-09-27 ENCOUNTER — Encounter
Admission: RE | Disposition: A | Discharge status: Home / Self Care | Payer: Self-pay | Source: Home / Self Care | Attending: Orthopedic Surgery

## 2018-09-27 ENCOUNTER — Ambulatory Visit
Admission: RE | Admit: 2018-09-27 | Discharge: 2018-09-27 | Disposition: A | Discharge status: Home / Self Care | Payer: Medicaid Other | Attending: Orthopedic Surgery | Admitting: Orthopedic Surgery

## 2018-09-27 ENCOUNTER — Ambulatory Visit: Payer: Medicaid Other | Admitting: Anesthesiology

## 2018-09-27 ENCOUNTER — Other Ambulatory Visit: Payer: Self-pay

## 2018-09-27 ENCOUNTER — Encounter: Payer: Self-pay | Admitting: *Deleted

## 2018-09-27 DIAGNOSIS — Z79899 Other long term (current) drug therapy: Secondary | ICD-10-CM | POA: Insufficient documentation

## 2018-09-27 DIAGNOSIS — M7021 Olecranon bursitis, right elbow: Secondary | ICD-10-CM | POA: Insufficient documentation

## 2018-09-27 DIAGNOSIS — X58XXXA Exposure to other specified factors, initial encounter: Secondary | ICD-10-CM | POA: Diagnosis not present

## 2018-09-27 DIAGNOSIS — I10 Essential (primary) hypertension: Secondary | ICD-10-CM | POA: Insufficient documentation

## 2018-09-27 DIAGNOSIS — F419 Anxiety disorder, unspecified: Secondary | ICD-10-CM | POA: Diagnosis not present

## 2018-09-27 DIAGNOSIS — F17200 Nicotine dependence, unspecified, uncomplicated: Secondary | ICD-10-CM | POA: Diagnosis not present

## 2018-09-27 DIAGNOSIS — F329 Major depressive disorder, single episode, unspecified: Secondary | ICD-10-CM | POA: Diagnosis not present

## 2018-09-27 DIAGNOSIS — S46121A Laceration of muscle, fascia and tendon of long head of biceps, right arm, initial encounter: Secondary | ICD-10-CM | POA: Insufficient documentation

## 2018-09-27 DIAGNOSIS — Z791 Long term (current) use of non-steroidal anti-inflammatories (NSAID): Secondary | ICD-10-CM | POA: Insufficient documentation

## 2018-09-27 DIAGNOSIS — M25721 Osteophyte, right elbow: Secondary | ICD-10-CM | POA: Insufficient documentation

## 2018-09-27 DIAGNOSIS — M25521 Pain in right elbow: Secondary | ICD-10-CM | POA: Diagnosis present

## 2018-09-27 HISTORY — PX: OLECRANON BURSECTOMY: SHX2097

## 2018-09-27 SURGERY — BURSECTOMY, ELBOW
Anesthesia: General | Laterality: Right

## 2018-09-27 MED ORDER — FENTANYL CITRATE (PF) 100 MCG/2ML IJ SOLN
INTRAMUSCULAR | Status: AC
  Administered 2018-09-27: 16:00:00 50 ug via INTRAVENOUS
  Filled 2018-09-27: qty 2

## 2018-09-27 MED ORDER — ONDANSETRON HCL 4 MG/2ML IJ SOLN
INTRAMUSCULAR | Status: DC | PRN
  Administered 2018-09-27: 4 mg via INTRAVENOUS

## 2018-09-27 MED ORDER — MIDAZOLAM HCL 2 MG/2ML IJ SOLN
INTRAMUSCULAR | Status: AC
  Filled 2018-09-27: qty 2

## 2018-09-27 MED ORDER — DEXAMETHASONE SODIUM PHOSPHATE 10 MG/ML IJ SOLN
INTRAMUSCULAR | Status: DC | PRN
  Administered 2018-09-27: 8 mg via INTRAVENOUS

## 2018-09-27 MED ORDER — ACETAMINOPHEN 500 MG PO TABS: 1000.0000 mg | ORAL_TABLET | Freq: Three times a day (TID) | ORAL | 2 refills | Status: AC

## 2018-09-27 MED ORDER — FENTANYL CITRATE (PF) 100 MCG/2ML IJ SOLN
INTRAMUSCULAR | Status: AC
  Filled 2018-09-27: qty 2

## 2018-09-27 MED ORDER — HYDROCODONE-ACETAMINOPHEN 5-325 MG PO TABS: 1.0000 | ORAL_TABLET | ORAL | 0 refills | Status: DC | PRN

## 2018-09-27 MED ORDER — ROCURONIUM BROMIDE 100 MG/10ML IV SOLN
INTRAVENOUS | Status: DC | PRN
  Administered 2018-09-27: 60 mg via INTRAVENOUS

## 2018-09-27 MED ORDER — OXYCODONE HCL 5 MG PO TABS
5.0000 mg | ORAL_TABLET | Freq: Once | ORAL | Status: AC | PRN
  Administered 2018-09-27: 16:00:00 5 mg via ORAL

## 2018-09-27 MED ORDER — MIDAZOLAM HCL 2 MG/2ML IJ SOLN
INTRAMUSCULAR | Status: DC | PRN
  Administered 2018-09-27: 2 mg via INTRAVENOUS

## 2018-09-27 MED ORDER — ONDANSETRON 4 MG PO TBDP: 4.0000 mg | ORAL_TABLET | Freq: Three times a day (TID) | ORAL | 0 refills | Status: DC | PRN

## 2018-09-27 MED ORDER — CLINDAMYCIN PHOSPHATE 900 MG/50ML IV SOLN
INTRAVENOUS | Status: AC
  Filled 2018-09-27: qty 50

## 2018-09-27 MED ORDER — CLINDAMYCIN PHOSPHATE 900 MG/50ML IV SOLN
900.0000 mg | Freq: Once | INTRAVENOUS | Status: AC
  Administered 2018-09-27: 13:00:00 900 mg via INTRAVENOUS

## 2018-09-27 MED ORDER — PROPOFOL 10 MG/ML IV BOLUS
INTRAVENOUS | Status: DC | PRN
  Administered 2018-09-27: 200 mg via INTRAVENOUS

## 2018-09-27 MED ORDER — ACETAMINOPHEN 10 MG/ML IV SOLN
INTRAVENOUS | Status: DC | PRN
  Administered 2018-09-27: 1000 mg via INTRAVENOUS

## 2018-09-27 MED ORDER — MEPERIDINE HCL 50 MG/ML IJ SOLN: 6.2500 mg | INTRAMUSCULAR | Status: DC | PRN

## 2018-09-27 MED ORDER — FENTANYL CITRATE (PF) 100 MCG/2ML IJ SOLN
INTRAMUSCULAR | Status: AC
  Administered 2018-09-27: 25 ug via INTRAVENOUS
  Filled 2018-09-27: qty 2

## 2018-09-27 MED ORDER — LACTATED RINGERS IV SOLN
INTRAVENOUS | Status: DC
  Administered 2018-09-27: 12:00:00 via INTRAVENOUS

## 2018-09-27 MED ORDER — OXYCODONE HCL 5 MG/5ML PO SOLN: 5.0000 mg | Freq: Once | ORAL | Status: AC | PRN

## 2018-09-27 MED ORDER — ASPIRIN EC 325 MG PO TBEC: 325.0000 mg | DELAYED_RELEASE_TABLET | Freq: Every day | ORAL | 0 refills | Status: AC

## 2018-09-27 MED ORDER — KETOROLAC TROMETHAMINE 30 MG/ML IJ SOLN
INTRAMUSCULAR | Status: DC | PRN
  Administered 2018-09-27: 30 mg via INTRAVENOUS

## 2018-09-27 MED ORDER — SUGAMMADEX SODIUM 200 MG/2ML IV SOLN
INTRAVENOUS | Status: DC | PRN
  Administered 2018-09-27: 200 mg via INTRAVENOUS

## 2018-09-27 MED ORDER — NEOMYCIN-POLYMYXIN B GU 40-200000 IR SOLN
Status: DC | PRN
  Administered 2018-09-27: 2 mL

## 2018-09-27 MED ORDER — FENTANYL CITRATE (PF) 100 MCG/2ML IJ SOLN
INTRAMUSCULAR | Status: DC | PRN
  Administered 2018-09-27 (×4): 50 ug via INTRAVENOUS

## 2018-09-27 MED ORDER — ACETAMINOPHEN 10 MG/ML IV SOLN
INTRAVENOUS | Status: AC
  Filled 2018-09-27: qty 100

## 2018-09-27 MED ORDER — CEFAZOLIN SODIUM-DEXTROSE 2-4 GM/100ML-% IV SOLN
INTRAVENOUS | Status: AC
  Filled 2018-09-27: qty 100

## 2018-09-27 MED ORDER — FENTANYL CITRATE (PF) 100 MCG/2ML IJ SOLN
25.0000 ug | INTRAMUSCULAR | Status: DC | PRN
  Administered 2018-09-27 (×2): 50 ug via INTRAVENOUS
  Administered 2018-09-27 (×2): 25 ug via INTRAVENOUS

## 2018-09-27 MED ORDER — PROMETHAZINE HCL 25 MG/ML IJ SOLN: 6.2500 mg | INTRAMUSCULAR | Status: DC | PRN

## 2018-09-27 MED ORDER — GLYCOPYRROLATE 0.2 MG/ML IJ SOLN
INTRAMUSCULAR | Status: DC | PRN
  Administered 2018-09-27: 0.2 mg via INTRAVENOUS

## 2018-09-27 MED ORDER — OXYCODONE HCL 5 MG PO TABS
ORAL_TABLET | ORAL | Status: AC
  Administered 2018-09-27: 16:00:00 5 mg via ORAL
  Filled 2018-09-27: qty 1

## 2018-09-27 MED ORDER — LIDOCAINE HCL (CARDIAC) PF 100 MG/5ML IV SOSY
PREFILLED_SYRINGE | INTRAVENOUS | Status: DC | PRN
  Administered 2018-09-27: 100 mg via INTRAVENOUS

## 2018-09-27 SURGICAL SUPPLY — 56 items
ANCHOR SUT BIO SW 4.75X19.1 (Anchor) ×2 IMPLANT
ANCHOR SUTBIO SWIVELK 3.5X15.8 (Anchor) ×2 IMPLANT
BANDAGE ELASTIC 4 LF NS (GAUZE/BANDAGES/DRESSINGS) ×3 IMPLANT
BNDG CMPR MED 5X4 ELC HKLP NS (GAUZE/BANDAGES/DRESSINGS) ×1
BNDG COHESIVE 6X5 TAN STRL LF (GAUZE/BANDAGES/DRESSINGS) ×2 IMPLANT
BNDG ESMARK 6X12 TAN STRL LF (GAUZE/BANDAGES/DRESSINGS) ×2 IMPLANT
CANISTER SUCT 1200ML W/VALVE (MISCELLANEOUS) ×3 IMPLANT
CAST PADDING 6X4YD ST 30248 (SOFTGOODS) ×2
CHLORAPREP W/TINT 26 (MISCELLANEOUS) ×3 IMPLANT
COVER WAND RF STERILE (DRAPES) ×3 IMPLANT
CUFF TOURN SGL QUICK 18X4 (TOURNIQUET CUFF) ×1 IMPLANT
CUFF TOURN SGL QUICK 24 (TOURNIQUET CUFF) ×2
CUFF TRNQT CYL 24X4X16.5-23 (TOURNIQUET CUFF) ×1 IMPLANT
DRAPE 3/4 80X56 (DRAPES) ×3 IMPLANT
DRAPE FLUOR MINI C-ARM 54X84 (DRAPES) ×2 IMPLANT
ELECT CAUTERY BLADE 6.4 (BLADE) ×3 IMPLANT
ELECT REM PT RETURN 9FT ADLT (ELECTROSURGICAL) ×3
ELECTRODE REM PT RTRN 9FT ADLT (ELECTROSURGICAL) ×1 IMPLANT
GAUZE SPONGE 4X4 12PLY STRL (GAUZE/BANDAGES/DRESSINGS) ×2 IMPLANT
GAUZE XEROFORM 1X8 LF (GAUZE/BANDAGES/DRESSINGS) ×3 IMPLANT
GLOVE BIOGEL PI IND STRL 8 (GLOVE) ×1 IMPLANT
GLOVE BIOGEL PI INDICATOR 8 (GLOVE) ×2
GLOVE SURG SYN 7.5  E (GLOVE) ×2
GLOVE SURG SYN 7.5 E (GLOVE) ×1 IMPLANT
GLOVE SURG SYN 7.5 PF PI (GLOVE) ×1 IMPLANT
GOWN STRL REUS W/ TWL LRG LVL3 (GOWN DISPOSABLE) ×1 IMPLANT
GOWN STRL REUS W/ TWL XL LVL3 (GOWN DISPOSABLE) ×1 IMPLANT
GOWN STRL REUS W/TWL LRG LVL3 (GOWN DISPOSABLE) ×2
GOWN STRL REUS W/TWL XL LVL3 (GOWN DISPOSABLE) ×2
KIT TURNOVER KIT A (KITS) ×3 IMPLANT
MARKER SKIN DUAL TIP RULER LAB (MISCELLANEOUS) ×2 IMPLANT
NDL FILTER BLUNT 18X1 1/2 (NEEDLE) ×1 IMPLANT
NDL MAYO 6 CRC TAPER PT (NEEDLE) IMPLANT
NEEDLE FILTER BLUNT 18X 1/2SAF (NEEDLE) ×2
NEEDLE FILTER BLUNT 18X1 1/2 (NEEDLE) ×1 IMPLANT
NEEDLE MAYO 6 CRC TAPER PT (NEEDLE) ×3 IMPLANT
NS IRRIG 500ML POUR BTL (IV SOLUTION) ×3 IMPLANT
PACK EXTREMITY ARMC (MISCELLANEOUS) ×3 IMPLANT
PAD ABD DERMACEA PRESS 5X9 (GAUZE/BANDAGES/DRESSINGS) ×1 IMPLANT
PAD CAST CTTN 4X4 STRL (SOFTGOODS) ×3 IMPLANT
PAD PREP 24X41 OB/GYN DISP (PERSONAL CARE ITEMS) ×1 IMPLANT
PADDING CAST COTTON 4X4 STRL (SOFTGOODS) ×8
PADDING CAST COTTON 6X4 ST (SOFTGOODS) IMPLANT
PENCIL SMOKE ULTRAEVAC 22 CON (MISCELLANEOUS) ×2 IMPLANT
SLING ARM LRG DEEP (SOFTGOODS) ×2 IMPLANT
SPLINT CAST 1 STEP 5X30 WHT (MISCELLANEOUS) IMPLANT
SPONGE LAP 18X18 RF (DISPOSABLE) ×3 IMPLANT
STAPLER SKIN PROX 35W (STAPLE) ×3 IMPLANT
STOCKINETTE IMPERVIOUS 9X36 MD (GAUZE/BANDAGES/DRESSINGS) ×2 IMPLANT
SUT BONE WAX W31G (SUTURE) ×2 IMPLANT
SUT ETHILON 3-0 FS-10 30 BLK (SUTURE) ×3
SUT VIC AB 0 CT2 27 (SUTURE) ×3 IMPLANT
SUT VIC AB 2-0 CT2 27 (SUTURE) ×3 IMPLANT
SUT VICRYL 3-0 27IN (SUTURE) ×2 IMPLANT
SUTURE EHLN 3-0 FS-10 30 BLK (SUTURE) ×1 IMPLANT
SYR 5ML LL (SYRINGE) ×3 IMPLANT

## 2018-09-27 NOTE — Anesthesia Procedure Notes (Signed)
Procedure Name: Intubation Date/Time: 09/27/2018 12:46 PM Performed by: Lowry Bowl, CRNA Pre-anesthesia Checklist: Patient identified, Emergency Drugs available, Suction available and Patient being monitored Patient Re-evaluated:Patient Re-evaluated prior to induction Oxygen Delivery Method: Circle system utilized Preoxygenation: Pre-oxygenation with 100% oxygen Induction Type: IV induction Ventilation: Mask ventilation without difficulty Laryngoscope Size: Mac and 4 Grade View: Grade I Tube type: Oral Number of attempts: 1 Airway Equipment and Method: Stylet Placement Confirmation: ETT inserted through vocal cords under direct vision,  positive ETCO2 and breath sounds checked- equal and bilateral Secured at: 22 cm Tube secured with: Tape Dental Injury: Teeth and Oropharynx as per pre-operative assessment

## 2018-09-27 NOTE — Discharge Instructions (Addendum)
Elbow Surgery Post-Op Instructions   1. Splint/Cast: You will have a splint (3/4 cast) on your arm after surgery. Ensure that this remains clean and dry until follow up appointment. If this becomes wet, you need to call our offices to get it changed or else you risk skin breakdown.     2. Driving:  Plan on not driving for at least 2 weeks. Please note that you are advised NOT to drive while taking narcotic pain medications as you may be impaired and unsafe to drive.   3. Activity: Weight bearing: Non-weight bearing. Use sling for comfort as needed.    4. Medications:  - You have been provided a prescription for narcotic pain medicine. After surgery, take 1-2 narcotic tablets every 4 hours if needed for severe pain. Please start this as soon as you begin to start having pain (if you received a nerve block, start taking as soon as this wears off).  - A prescription for anti-nausea medication will be provided in case the narcotic medicine causes nausea - take 1 tablet every 6 hours only if nauseated.  -Take tylenol 1000 mg every 8 hours for pain.  May stop tylenol 5 days after surgery if you are having minimal pain. -Take aspirin 325mg /day x 2 weeks to help prevent DVTs (blood clots).    If you are taking prescription medication for anxiety, depression, insomnia, muscle spasm, chronic pain, or for attention deficit disorder you are advised that you are at a higher risk of adverse effects with use of narcotics post-op, including narcotic addiction/dependence, depressed breathing, death. If you use non-prescribed substances: alcohol, marijuana, cocaine, heroin, methamphetamines, etc., you are at a higher risk of adverse effects with use of narcotics post-op, including narcotic addiction/dependence, depressed breathing, death. You are advised that taking > 50 morphine milligram equivalents (MME) of narcotic pain medication per day results in twice the risk of overdose or death. For your prescription  provided: oxycodone 5 mg - taking more than 6 tablets per day. Be advised that we will prescribe narcotics short-term, for acute post-operative pain only.   6. Physical Therapy: No planned therapy for the first 2 weeks. We can discuss need for PT after 1st follow up visit in 2 weeks.    7. Work/School: May do light duty/desk job or return to school in approximately 1-2 weeks when off of narcotics, pain is well-controlled, and swelling has decreased. May not return to full work for at least 2 weeks.    8. Post-Op Appointments: Your first post-op appointment will be with Dr. Allena KatzPatel in approximately 2 weeks time.  If you find that they have not been scheduled please call the Orthopaedic Appointment front desk at [phone].    AMBULATORY SURGERY  DISCHARGE INSTRUCTIONS   1) The drugs that you were given will stay in your system until tomorrow so for the next 24 hours you should not:  A) Drive an automobile B) Make any legal decisions C) Drink any alcoholic beverage   2) You may resume regular meals tomorrow.  Today it is better to start with liquids and gradually work up to solid foods.  You may eat anything you prefer, but it is better to start with liquids, then soup and crackers, and gradually work up to solid foods.   3) Please notify your doctor immediately if you have any unusual bleeding, trouble breathing, redness and pain at the surgery site, drainage, fever, or pain not relieved by medication.    4) Additional Instructions:  Please contact your physician with any problems or Same Day Surgery at [phone], Monday through Friday 6 am to 4 pm, or [place] at [facility] number at [phone].

## 2018-09-27 NOTE — Anesthesia Preprocedure Evaluation (Signed)
Anesthesia Evaluation  Patient identified by MRN, date of birth, ID band Patient awake    Reviewed: Allergy & Precautions, NPO status , Patient's Chart, lab work & pertinent test results  History of Anesthesia Complications Negative for: history of anesthetic complications  Airway Mallampati: II  TM Distance: >3 FB Neck ROM: Full    Dental  (+) Poor Dentition, Chipped   Pulmonary neg sleep apnea, neg COPD, Current Smoker,    breath sounds clear to auscultation- rhonchi (-) wheezing      Cardiovascular hypertension, Pt. on medications (-) CAD, (-) Past MI, (-) Cardiac Stents and (-) CABG  Rhythm:Regular Rate:Normal - Systolic murmurs and - Diastolic murmurs    Neuro/Psych neg Seizures PSYCHIATRIC DISORDERS Anxiety Depression negative neurological ROS     GI/Hepatic Neg liver ROS, GERD  ,  Endo/Other  negative endocrine ROSneg diabetes  Renal/GU negative Renal ROS     Musculoskeletal negative musculoskeletal ROS (+)   Abdominal (+) - obese,   Peds  Hematology negative hematology ROS (+)   Anesthesia Other Findings Past Medical History: No date: Anxiety and depression No date: Depression No date: Diverticulosis of colon No date: GERD (gastroesophageal reflux disease) No date: History of diverticulitis of colon No date: Hypertension No date: UTI (lower urinary tract infection)   Reproductive/Obstetrics                             Anesthesia Physical Anesthesia Plan  ASA: II  Anesthesia Plan: General   Post-op Pain Management:    Induction: Intravenous  PONV Risk Score and Plan: 0 and Ondansetron and Midazolam  Airway Management Planned: Oral ETT  Additional Equipment:   Intra-op Plan:   Post-operative Plan: Extubation in OR  Informed Consent: I have reviewed the patients History and Physical, chart, labs and discussed the procedure including the risks, benefits and  alternatives for the proposed anesthesia with the patient or authorized representative who has indicated his/her understanding and acceptance.     Dental advisory given  Plan Discussed with: CRNA and Anesthesiologist  Anesthesia Plan Comments:         Anesthesia Quick Evaluation

## 2018-09-27 NOTE — H&P (Signed)
Paper H&P to be scanned into permanent record. H&P reviewed. No significant changes noted.  

## 2018-09-27 NOTE — Transfer of Care (Signed)
Immediate Anesthesia Transfer of Care Note  Patient: Manuel Harmon  Procedure(s) Performed: Ree Shay BURSECTOMY, EXOSTOSIS REMOVAL (Right )  Patient Location: PACU  Anesthesia Type:General  Level of Consciousness: awake, drowsy and patient cooperative  Airway & Oxygen Therapy: Patient Spontanous Breathing and Patient connected to face mask oxygen  Post-op Assessment: Report given to RN, Post -op Vital signs reviewed and stable and Patient moving all extremities  Post vital signs: Reviewed and stable  Last Vitals:  Vitals Value Taken Time  BP 133/75 09/27/18 1449  Temp 36.6 C 09/27/18 1449  Pulse 84 09/27/18 1455  Resp 16 09/27/18 1455  SpO2 100 % 09/27/18 1455  Vitals shown include unvalidated device data.  Last Pain:  Vitals:   09/27/18 1449  TempSrc:   PainSc: (P) Asleep         Complications: No apparent anesthesia complications

## 2018-09-27 NOTE — Op Note (Signed)
DATE OF SURGERY: 09/27/2018   PRE-OP DIAGNOSIS:  1. Right olecranon bursitis 2. Right olecranon exostosis   POST-OP DIAGNOSIS:  1. Right olecranon bursitis 2. Right olecranon exostosis 3. Right partial-thickness triceps tear   PROCEDURES:  1. Right olecranon bursectomy 2. Right olecranon exostosis removal 3. Right triceps tendon repair   SURGEON: Rosealee AlbeeSunny H. Cameo Schmiesing, MD   ASSISTANT(S): none   ANESTHESIA: gen   TOTAL IV FLUIDS: see anesthesia record   ESTIMATED BLOOD LOSS: 50cc   TOURNIQUET TIME: 60 min   DRAINS:  none   IMPLANTS: Arthrex - 4.6975mm SwiveLock anchor   COMPLICATIONS: None apparent.   INDICATIONS: Fayne MediateRalph J Mount is a 48 y.o. male who has had intermittent swelling about his right olecranon.  Additionally, he has significant pain with elbow extension activities such as throwing a ball and lifting weights.  He underwent non-surgical management with NSAIDs and compression without adequate relief.  Radiographs showed a prominent olecranon bony exostosis.  After discussion of risks, benefits, and alternatives, the patient agreed to proceed with surgery.  We discussed at length that there was a risk of wound breakdown, recurrence, and residual pain after surgery.   DETAILS OF PROCEDURE: The patient was identified in the preoperative holding area.  The patient was brought to the operating room and placed on the table. Anesthesia was administered. The patient was then transferred to the lateral position on a beanbag. Operative arm was placed in an arm holder so elbow rested at 90 degrees. Arm was prescrubbed with Hibiclens and alcohol, prepped with ChloraPrep and draped in the usual sterile fashion. A surgical time-out occurred. A sterile, well-padded sterile tourniquet was placed. Appropriate IV antibiotics were administered.   The arm was elevated and tourniquet inflated to 250mmHg. A midline incision was created about the elbow curving laterally to avoid the tip of the  olecranon. We sharply dissected down to the level of the olecranon bursa. There was no apparent sign of infection. A combination of a rongeur, curette and knife was used to debride the olecranon bursa.  A rongeur and curette were used again to debride all loose and devitalized tissue including the remainder of the olecranon bursa and subcutaneous tissue.  The exostosis was identified at the distal end of the triceps insertion..  An osteotome was used to remove the exostosis with the assistance of fluoroscopy.  A rongeur was used to smooth and the edges of the remainder of the olecranon.  Bone wax was placed to reduce bony bleeding.  At this point, I noted that the triceps had a partial-thickness tear.  A suture tape was used to secure the triceps with a Krakw stitch.  A a hole was drilled in the olecranon to a depth of approximately 17 mm using a 4.0 mm drill with care taken to avoid the elbow joint itself.  The SwiveLock was loaded with a suture tape and inserted with the elbow in extension.  The FiberWire was also placed through the triceps in a mattress fashion and tied.  This allowed for excellent reapproximation of the triceps onto its footprint.  The triceps repair was stable from 0-60 degrees of elbow flexion without undue tension. The wound was thoroughly irrigated.   Tourniquet was let down and hemostasis was achieved. The subdermal layer was closed with 2-0 vicryl. The skin was staples. The wound was dressed with Xeroform, fluffs, ABD, and cotton wrap. A posterior long-arm splint with the elbow in 60 degrees of flexion was applied.   Instrument, sponge, and needle  counts were correct prior to wound closure and at the conclusion of the case. The patient was then awakened from anesthesia without complication     POST-OPERATIVE PLAN: Patient will be discharged to home. The patient will be NWB in splint for 2 weeks. He will follow up in ~10-14 days for transition out of splint and stable removal.  ASA  x2 weeks for DVT prophylaxis.

## 2018-09-27 NOTE — Anesthesia Postprocedure Evaluation (Signed)
Anesthesia Post Note  Patient: Manuel Harmon  Procedure(s) Performed: Ree Shay BURSECTOMY, EXOSTOSIS REMOVAL (Right )  Patient location during evaluation: PACU Anesthesia Type: General Level of consciousness: awake and alert and oriented Pain management: pain level controlled Vital Signs Assessment: post-procedure vital signs reviewed and stable Respiratory status: spontaneous breathing, nonlabored ventilation and respiratory function stable Cardiovascular status: blood pressure returned to baseline and stable Postop Assessment: no signs of nausea or vomiting Anesthetic complications: no     Last Vitals:  Vitals:   09/27/18 1156 09/27/18 1449  BP: (!) 157/95 133/75  Pulse: 74 88  Resp: 16 15  Temp: 36.4 C 36.6 C  SpO2: 100% 100%    Last Pain:  Vitals:   09/27/18 1449  TempSrc:   PainSc: Asleep                 Maryse Brierley

## 2018-09-27 NOTE — Anesthesia Post-op Follow-up Note (Signed)
Anesthesia QCDR form completed.        

## 2018-09-30 ENCOUNTER — Encounter: Payer: Self-pay | Admitting: Orthopedic Surgery

## 2018-10-04 ENCOUNTER — Encounter: Payer: Self-pay | Admitting: Emergency Medicine

## 2018-10-04 ENCOUNTER — Other Ambulatory Visit: Payer: Self-pay

## 2018-10-04 ENCOUNTER — Emergency Department
Admission: EM | Admit: 2018-10-04 | Discharge: 2018-10-04 | Disposition: A | Discharge status: Home / Self Care | Payer: Medicaid Other | Attending: Emergency Medicine | Admitting: Emergency Medicine

## 2018-10-04 DIAGNOSIS — Z79899 Other long term (current) drug therapy: Secondary | ICD-10-CM | POA: Insufficient documentation

## 2018-10-04 DIAGNOSIS — F17210 Nicotine dependence, cigarettes, uncomplicated: Secondary | ICD-10-CM | POA: Diagnosis not present

## 2018-10-04 DIAGNOSIS — I10 Essential (primary) hypertension: Secondary | ICD-10-CM | POA: Diagnosis not present

## 2018-10-04 DIAGNOSIS — Z4689 Encounter for fitting and adjustment of other specified devices: Secondary | ICD-10-CM | POA: Insufficient documentation

## 2018-10-04 DIAGNOSIS — Z5189 Encounter for other specified aftercare: Secondary | ICD-10-CM

## 2018-10-04 NOTE — ED Triage Notes (Signed)
Pt to ED, pt had surgery on his right arm on Friday. Pt states that Dr. Posey Pronto did his surgery, pt called their office today to tell them that his cast was too loose and that it was falling off. Pt was told to come to the ER so that he could have it re-wrapped. Pt is in NAD.

## 2018-10-04 NOTE — ED Provider Notes (Signed)
[facility] Emergency Department Provider Note  ____________________________________________  Time seen: Approximately 6:01 PM  I have reviewed the triage vital signs and the nursing notes.   HISTORY  Chief Complaint Post-op Problem    HPI Manuel Harmon is a 48 y.o. male presents to the emergency department status post right right elbow bursectomy on September 27, 2018.  Patient reports that he thinks his splint is slipping down his arm and is here for a splint check.  Patient has no other postoperative concerns.        Past Medical History:  Diagnosis Date  . Anxiety and depression   . Depression   . Diverticulosis of colon   . GERD (gastroesophageal reflux disease)   . History of diverticulitis of colon   . Hypertension   . UTI (lower urinary tract infection)     Patient Active Problem List   Diagnosis Date Noted  . Colon cancer screening   . Tonsillopharyngitis 08/04/2017  . HTN (hypertension) 03/23/2015  . Tobacco use 03/23/2015  . Depression 03/13/2014  . MDD (major depressive disorder), recurrent severe, without psychosis ([place]) 03/13/2014  . Anxiety disorder 03/13/2014  . PTSD (post-traumatic stress disorder) 03/13/2014  . Cocaine use disorder, mild, abuse ([place]) 03/13/2014  . Cannabis use disorder, moderate, dependence ([place]) 03/13/2014  . Suture reaction 01/15/2014    Past Surgical History:  Procedure Laterality Date  . COLONOSCOPY WITH PROPOFOL N/A 12/06/2017   Procedure: COLONOSCOPY WITH PROPOFOL;  Surgeon: Lin Landsman, MD;  Location: [facility] ENDOSCOPY;  Service: Gastroenterology;  Laterality: N/A;  . LEFT COLECTOMY AND APPENDECTOMY  05-31-2007  . MOUTH SURGERY    . OLECRANON BURSECTOMY Right 09/27/2018   Procedure: OLECRANON BURSECTOMY, EXOSTOSIS REMOVAL;  Surgeon: Leim Fabry, MD;  Location: [facility] ORS;  Service: Orthopedics;  Laterality: Right;  . RIGHT ORCHIOPEXY INGUINAL APPROACH AND HYDROCELECTOMY  1989  . SCAR REVISION N/A  01/15/2014   Procedure: SCAR REVISION AND REMOVAL OF SUTURE MATERIAL FROM ABDOMINAL WALL;  Surgeon: Armandina Gemma, MD;  Location: [facility];  Service: General;  Laterality: N/A;  . TONSILLECTOMY AND ADENOIDECTOMY N/A 09/11/2017   Procedure: TONSILLECTOMY AND POSSIBLE ADENOIDECTOMY;  Surgeon: Clyde Canterbury, MD;  Location: [place];  Service: ENT;  Laterality: N/A;  NO ADENOID TISSUE PRESENT PER MD    Prior to Admission medications   Medication Sig Start Date End Date Taking? Authorizing Provider  acetaminophen (TYLENOL) 500 MG tablet Take 2 tablets (1,000 mg total) by mouth every 8 (eight) hours. 09/27/18 09/27/19  Leim Fabry, MD  aspirin EC 325 MG tablet Take 1 tablet (325 mg total) by mouth daily for 14 days. 09/27/18 10/11/18  Leim Fabry, MD  clonazePAM (KLONOPIN) 1 MG tablet Take 1 mg by mouth at bedtime.    [provider]  famotidine (PEPCID) 20 MG tablet Take 1 tablet (20 mg total) by mouth 2 (two) times daily. Patient taking differently: Take 20 mg by mouth as needed.  09/16/18   Earleen Newport, MD  HYDROcodone-acetaminophen (NORCO) 5-325 MG tablet Take 1-2 tablets by mouth every 4 (four) hours as needed for moderate pain or severe pain. 09/27/18   Leim Fabry, MD  lisinopril (PRINIVIL,ZESTRIL) 20 MG tablet Take 20 mg by mouth every morning.  03/17/14   [provider]  meloxicam (MOBIC) 7.5 MG tablet Take by mouth. 01/18/16   [provider]  mirtazapine (REMERON) 30 MG tablet Take 30 mg by mouth at bedtime.  07/28/15   [provider]  mirtazapine (REMERON) 45 MG tablet Take 45 mg by mouth at bedtime.    [provider]  ondansetron (ZOFRAN ODT) 4 MG disintegrating tablet Take 1 tablet (4 mg total) by mouth every 8 (eight) hours as needed for nausea or vomiting. 09/27/18   Signa KellPatel, Sunny, MD  triamcinolone cream (KENALOG) 0.1 % Apply 1 application topically 2 (two) times daily as needed. Outer part of armpits    [provider]    Allergies Ibuprofen, Penicillins, and Wellbutrin [bupropion]  Family History  Problem Relation Age of Onset  . Drug abuse Father   . Prostate cancer Paternal Grandfather   . Bladder Cancer Neg Hx   . Kidney cancer Neg Hx     Social History Social History   Tobacco Use  . Smoking status: Current Every Day Smoker    Packs/day: 1.00    Years: 33.00    Pack years: 33.00    Types: Cigarettes  . Smokeless tobacco: Never Used  Substance Use Topics  . Alcohol use: Yes    Alcohol/week: 2.0 standard drinks    Types: 2 Standard drinks or equivalent per week    Comment: occ  . Drug use: No     Review of Systems  Constitutional: No fever/chills Eyes: No visual changes. No discharge ENT: No upper respiratory complaints. Cardiovascular: no chest pain. Respiratory: no cough. No SOB. Gastrointestinal: No abdominal pain.  No nausea, no vomiting.  No diarrhea.  No constipation. Genitourinary: Negative for dysuria. No hematuria Musculoskeletal: Negative for musculoskeletal pain. Skin: Negative for rash, abrasions, lacerations, ecchymosis. Neurological: Negative for headaches, focal weakness or numbness.   ____________________________________________   PHYSICAL EXAM:  VITAL SIGNS: ED Triage Vitals  Enc Vitals Group     BP 10/04/18 1736 (!) 146/89     Pulse Rate 10/04/18 1736 80     Resp 10/04/18 1736 16     Temp 10/04/18 1736 98.3 F (36.8 C)     Temp Source 10/04/18 1736 Oral     SpO2 10/04/18 1736 99 %     Weight 10/04/18 1734 200 lb (90.7 kg)     Height 10/04/18 1734 5\' 10"  (1.778 m)     Head Circumference --      Peak Flow --      Pain Score 10/04/18 1734 0     Pain Loc --      Pain Edu? --      Excl. in GC? --      Constitutional: Alert and oriented. Well appearing and in no acute distress. Eyes: Conjunctivae are normal. PERRL. EOMI. Head: Atraumatic. Cardiovascular: Normal rate, regular rhythm. Normal S1 and S2.  Good peripheral  circulation. Respiratory: Normal respiratory effort without tachypnea or retractions. Lungs CTAB. Good air entry to the bases with no decreased or absent breath sounds. Gastrointestinal: Bowel sounds 4 quadrants. Soft and nontender to palpation. No guarding or rigidity. No palpable masses. No distention. No CVA tenderness. Musculoskeletal: Full range of motion to all extremities. No gross deformities appreciated.  Palpable radial pulse bilaterally and symmetrically. Neurologic:  Normal speech and language. No gross focal neurologic deficits are appreciated.  Skin:  Skin is warm, dry and intact. No rash noted. Psychiatric: Mood and affect are normal. Speech and behavior are normal. Patient exhibits appropriate insight and judgement.   ____________________________________________   LABS (all labs ordered are listed, but only abnormal results are displayed)  Labs Reviewed - No data to display ____________________________________________  EKG   ____________________________________________  RADIOLOGY   No  results found.  ____________________________________________    PROCEDURES  Procedure(s) performed:    Procedures  SPLINT APPLICATION Date/Time: 6:03 PM Authorized by: Orvil FeilJaclyn M Iyona Pehrson Consent: Verbal consent obtained. Risks and benefits: risks, benefits and alternatives were discussed Consent given by: patient Splint applied by: Gabriel Cirriasey Rodgers PA-S Location details: Right elbow  Splint type: Posterior Slab  Supplies used: Ace wrap and cotton web roll.  Post-procedure: The splinted body part was neurovascularly unchanged following the procedure. Patient tolerance: Patient tolerated the procedure well with no immediate complications.     Medications - No data to display   ____________________________________________   INITIAL IMPRESSION / ASSESSMENT AND PLAN / ED COURSE  Pertinent labs & imaging results that were available during my care of the patient were  reviewed by me and considered in my medical decision making (see chart for details).  Review of the [place] CSRS was performed in accordance of the [facility] prior to dispensing any controlled drugs.        Assessment and Plan:  Splint Check:  48 year old male presents to the emergency department for a splint check.  Splint was reinforced while in the emergency department.  Patient was advised to follow-up with orthopedics as needed.  All patient questions were answered.   ____________________________________________  FINAL CLINICAL IMPRESSION(S) / ED DIAGNOSES  Final diagnoses:  Visit for wound check      NEW MEDICATIONS STARTED DURING THIS VISIT:  ED Discharge Orders    None          This chart was dictated using voice recognition software/Dragon. Despite best efforts to proofread, errors can occur which can change the meaning. Any change was purely unintentional.    Orvil FeilWoods, Rolin Schult M, PA-C 10/04/18 1804    Minna AntisPaduchowski, Kevin, MD 10/04/18 [phone]

## 2018-10-04 NOTE — ED Notes (Signed)
See triage note  States his arm splint is slipping  States he had surgery last Friday

## 2018-10-12 ENCOUNTER — Encounter: Payer: Self-pay | Admitting: Emergency Medicine

## 2018-10-12 ENCOUNTER — Emergency Department
Admission: EM | Admit: 2018-10-12 | Discharge: 2018-10-12 | Disposition: A | Discharge status: Home / Self Care | Payer: Medicaid Other | Attending: Emergency Medicine | Admitting: Emergency Medicine

## 2018-10-12 ENCOUNTER — Other Ambulatory Visit: Payer: Self-pay

## 2018-10-12 DIAGNOSIS — Z888 Allergy status to other drugs, medicaments and biological substances status: Secondary | ICD-10-CM | POA: Diagnosis not present

## 2018-10-12 DIAGNOSIS — M5431 Sciatica, right side: Secondary | ICD-10-CM | POA: Diagnosis not present

## 2018-10-12 DIAGNOSIS — Z79899 Other long term (current) drug therapy: Secondary | ICD-10-CM | POA: Insufficient documentation

## 2018-10-12 DIAGNOSIS — Z886 Allergy status to analgesic agent status: Secondary | ICD-10-CM | POA: Diagnosis not present

## 2018-10-12 DIAGNOSIS — I10 Essential (primary) hypertension: Secondary | ICD-10-CM | POA: Insufficient documentation

## 2018-10-12 DIAGNOSIS — Z880 Allergy status to penicillin: Secondary | ICD-10-CM | POA: Insufficient documentation

## 2018-10-12 DIAGNOSIS — F17210 Nicotine dependence, cigarettes, uncomplicated: Secondary | ICD-10-CM | POA: Insufficient documentation

## 2018-10-12 DIAGNOSIS — M545 Low back pain: Secondary | ICD-10-CM | POA: Diagnosis present

## 2018-10-12 MED ORDER — METHOCARBAMOL 500 MG PO TABS
500.0000 mg | ORAL_TABLET | Freq: Once | ORAL | Status: AC
  Administered 2018-10-12: 16:00:00 500 mg via ORAL
  Filled 2018-10-12: qty 1

## 2018-10-12 MED ORDER — PREDNISONE 10 MG PO TABS: ORAL_TABLET | ORAL | 0 refills | Status: DC

## 2018-10-12 MED ORDER — OXYCODONE HCL 5 MG PO TABS: 5.0000 mg | ORAL_TABLET | Freq: Four times a day (QID) | ORAL | 0 refills | Status: DC | PRN

## 2018-10-12 MED ORDER — METHOCARBAMOL 500 MG PO TABS: 500.0000 mg | ORAL_TABLET | Freq: Four times a day (QID) | ORAL | 1 refills | Status: DC

## 2018-10-12 MED ORDER — PREDNISONE 20 MG PO TABS
60.0000 mg | ORAL_TABLET | Freq: Once | ORAL | Status: AC
  Administered 2018-10-12: 60 mg via ORAL
  Filled 2018-10-12: qty 3

## 2018-10-12 MED ORDER — OXYCODONE-ACETAMINOPHEN 5-325 MG PO TABS
1.0000 | ORAL_TABLET | Freq: Once | ORAL | Status: AC
  Administered 2018-10-12: 1 via ORAL
  Filled 2018-10-12: qty 1

## 2018-10-12 NOTE — Discharge Instructions (Signed)
Please take medications as prescribed.

## 2018-10-12 NOTE — ED Triage Notes (Signed)
Pt to ED via POV c/o sharp pain in his lower back that radiates into his buttocks. Pt is in NAD at this time.

## 2018-10-12 NOTE — ED Provider Notes (Signed)
[facility] EMERGENCY DEPARTMENT Provider Note   CSN: [id number] Arrival date & time: 10/12/18  1458     History   Chief Complaint Chief Complaint  Patient presents with  . Back Pain    HPI Manuel Harmon is a 48 y.o. male presents to the emergency department for evaluation of electrical pain going down his right buttocks, posterior thigh into his leg.  Patient states his symptoms began this morning upon awakening.  No trauma or injury.  No history of sciatica.  No chest pain shortness of breath or leg swelling.  Patient states when he stands he has a sharp burning electrical pain that radiates from his buttocks into his right thigh.  He gets some relief with lying down in a fetal position on his left side but still with pain in his left buttocks that he describes as a sharp burning pain.  No loss of bowel or bladder symptoms.  Denies any weakness.  Has not had a medications for pain, was recently on Norco for right elbow bursectomy, exostosis removal and triceps tendon repair, date of surgery 09/27/2018.    HPI  Past Medical History:  Diagnosis Date  . Anxiety and depression   . Depression   . Diverticulosis of colon   . GERD (gastroesophageal reflux disease)   . History of diverticulitis of colon   . Hypertension   . UTI (lower urinary tract infection)     Patient Active Problem List   Diagnosis Date Noted  . Colon cancer screening   . Tonsillopharyngitis 08/04/2017  . HTN (hypertension) 03/23/2015  . Tobacco use 03/23/2015  . Depression 03/13/2014  . 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 Anxiety disorder 03/13/2014  . PTSD (post-traumatic stress disorder) 03/13/2014  . Cocaine use disorder, mild, abuse ([facility]) 03/13/2014  . Cannabis use disorder, moderate, dependence ([facility]) 03/13/2014  . Suture reaction 01/15/2014    Past Surgical History:  Procedure Laterality Date  . COLONOSCOPY WITH PROPOFOL N/A  12/06/2017   Procedure: COLONOSCOPY WITH PROPOFOL;  Surgeon: Toney ReilVanga, Rohini Reddy, MD;  Location: [facility] ENDOSCOPY;  Service: Gastroenterology;  Laterality: N/A;  . LEFT COLECTOMY AND APPENDECTOMY  05-31-2007  . MOUTH SURGERY    . OLECRANON BURSECTOMY Right 09/27/2018   Procedure: OLECRANON BURSECTOMY, EXOSTOSIS REMOVAL;  Surgeon: Signa KellPatel, Sunny, MD;  Location: [facility] ORS;  Service: Orthopedics;  Laterality: Right;  . RIGHT ORCHIOPEXY INGUINAL APPROACH AND HYDROCELECTOMY  1989  . SCAR REVISION N/A 01/15/2014   Procedure: SCAR REVISION AND REMOVAL OF SUTURE MATERIAL FROM ABDOMINAL WALL;  Surgeon: Darnell Levelodd Gerkin, MD;  [facility] [place];  Service: General;  Laterality: N/A;  . TONSILLECTOMY AND ADENOIDECTOMY N/A 09/11/2017   Procedure: TONSILLECTOMY AND POSSIBLE ADENOIDECTOMY;  Surgeon: Geanie LoganBennett, Paul, MD;  [facility];  Service: ENT;  Laterality: N/A;  NO ADENOID TISSUE PRESENT PER MD        Home Medications    Prior to Admission medications   Medication Sig Start Date End Date Taking? Authorizing Provider  acetaminophen (TYLENOL) 500 MG tablet Take 2 tablets (1,000 mg total) by mouth every 8 (eight) hours. 09/27/18 09/27/19  Signa KellPatel, Sunny, MD  clonazePAM (KLONOPIN) 1 MG tablet Take 1 mg by mouth at bedtime.    [provider]  famotidine (PEPCID) 20 MG tablet Take 1 tablet (20 mg total) by mouth 2 (two) times daily. Patient taking differently: Take 20 mg by mouth as needed.  09/16/18   Emily FilbertWilliams, Jonathan E, MD  HYDROcodone-acetaminophen (NORCO) 5-325 MG tablet Take 1-2 tablets by mouth every 4 (four) hours as needed for moderate pain or severe pain. 09/27/18   Leim Fabry, MD  lisinopril (PRINIVIL,ZESTRIL) 20 MG tablet Take 20 mg by mouth every morning.  03/17/14   [provider]  meloxicam (MOBIC) 7.5 MG tablet Take by mouth. 01/18/16   [provider]  mirtazapine (REMERON) 30 MG tablet Take 30 mg by mouth at bedtime.  07/28/15   [provider]  mirtazapine (REMERON) 45 MG tablet Take 45 mg by mouth at bedtime.    [provider]  ondansetron (ZOFRAN ODT) 4 MG disintegrating tablet Take 1 tablet (4 mg total) by mouth every 8 (eight) hours as needed for nausea or vomiting. 09/27/18   Leim Fabry, MD  triamcinolone cream (KENALOG) 0.1 % Apply 1 application topically 2 (two) times daily as needed. Outer part of armpits    [provider]    Family History Family History  Problem Relation Age of Onset  . Drug abuse Father   . Prostate cancer Paternal Grandfather   . Bladder Cancer Neg Hx   . Kidney cancer Neg Hx     Social History Social History   Tobacco Use  . Smoking status: Current Every Day Smoker    Packs/day: 1.00    Years: 33.00    Pack years: 33.00    Types: Cigarettes  . Smokeless tobacco: Never Used  Substance Use Topics  . Alcohol use: Yes    Alcohol/week: 2.0 standard drinks    Types: 2 Standard drinks or equivalent per week    Comment: occ  . Drug use: No     Allergies   Ibuprofen, Penicillins, and Wellbutrin [bupropion]   Review of Systems Review of Systems  Constitutional: Negative for fever.  Respiratory: Negative for cough and shortness of breath.   Cardiovascular: Negative for chest pain and leg swelling.  Gastrointestinal: Negative for abdominal pain.  Genitourinary: Negative for difficulty urinating, dysuria and urgency.  Musculoskeletal: Positive for back pain. Negative for myalgias.  Skin: Negative for rash.  Neurological: Positive for numbness. Negative for dizziness and headaches.     Physical Exam Updated Vital Signs BP 140/75 (BP Location: Left Arm)   Pulse 86   Temp 98.6 F (37 C) (Oral)   Resp 16   Ht 5\' 10"  (1.778 m)   Wt 90.7 kg   SpO2 95%   BMI 28.70 kg/m   Physical Exam Constitutional:      Appearance: He is well-developed.  HENT:     Head: Normocephalic and atraumatic.  Eyes:     Conjunctiva/sclera: Conjunctivae normal.   Neck:     Musculoskeletal: Normal range of motion.  Cardiovascular:     Rate and Rhythm: Normal rate.  Pulmonary:     Effort: Pulmonary effort is normal. No respiratory distress.  Musculoskeletal:     Comments: Lumbar Spine: Examination of the lumbar spine reveals no bony abnormality, no edema, and no ecchymosis.  There is no step off.  The patient has decreased range of motion of the lumbar spine with flexion and extension.  The patient has normal lateral bend and rotation.  The patient has pain with lumbar flexion and extension.   The patient is non tender along the spinous process.  The patient is non tender along the paravertebral muscles, with no muscle spasms.  The patient is non tender along the iliac crest.  The patient is moderately tender in the right sciatic notch.  The patient is non tender along the Sacroiliac joint.  There is no Coccyx joint tenderness.    Bilateral Lower Extremities: Examination of the lower extremities reveals no bony abnormality, no edema, and no ecchymosis.  The patient has full active and passive range of motion of the hips, knees, and ankles.  There is no discomfort with range of motion exercises.  The patient is non tender along the greater trochanter region.  The patient has a negative Denna HaggardHomans' test bilaterally.  There is normal skin warmth.  There is normal capillary refill bilaterally.    Neurologic: The patient has a negative straight leg raise.  The patient has normal muscle strength testing for the quadriceps, calves, ankle dorsiflexion, ankle plantarflexion, and extensor hallicus longus.  The patient has sensation that is intact to light touch.  The deep tendon reflexes are normal bilaterally.   Skin:    General: Skin is warm.     Findings: No rash.  Neurological:     Mental Status: He is alert and oriented to person, place, and time.  Psychiatric:        Behavior: Behavior normal.        Thought Content: Thought content normal.      ED  Treatments / Results  Labs (all labs ordered are listed, but only abnormal results are displayed) Labs Reviewed - No data to display  EKG None  Radiology No results found.  Procedures Procedures (including critical care time)  Medications Ordered in ED Medications  oxyCODONE-acetaminophen (PERCOCET/ROXICET) 5-325 MG per tablet 1 tablet (has no administration in time range)  methocarbamol (ROBAXIN) tablet 500 mg (has no administration in time range)  predniSONE (DELTASONE) tablet 60 mg (has no administration in time range)     Initial Impression / Assessment and Plan / ED Course  I have reviewed the triage vital signs and the nursing notes.  Pertinent labs & imaging results that were available during my care of the patient were reviewed by me and considered in my medical decision making (see chart for details).        48 year old male with right-sided lumbar radiculopathy.  He is placed on oxycodone, methocarbamol, prednisone taper.  No neurological deficits.  Vital signs are stable, afebrile.  Patient understands signs symptoms return to ED for.  You follow-up PCP if no improvement 1 week.  Final Clinical Impressions(s) / ED Diagnoses   Final diagnoses:  Sciatica of right side    ED Discharge Orders    None       Ronnette JuniperGaines, Sloan Takagi C, PA-C 10/12/18 1643    Shaune PollackIsaacs, Cameron, MD 10/18/18 [phone]

## 2018-10-12 NOTE — ED Notes (Addendum)
See triage note. Pt states he woke up this morning in severe pain. He indicates pain begins in his mid back and radiates down his spine to his tail bone. Pain described as a sharp "zing like an electric shock" Pt denies any previous Hx of back pain or spinal injury. Pt denies taking any medication for pain. Pt had surgery on his right elbow last week. Upon assessment, pt A&Ox4, and appears to be in sever pain. No respiratory Sx evident.

## 2019-02-13 ENCOUNTER — Other Ambulatory Visit: Payer: Self-pay

## 2019-02-13 ENCOUNTER — Emergency Department
Admission: EM | Admit: 2019-02-13 | Discharge: 2019-02-13 | Disposition: A | Discharge status: Home / Self Care | Payer: Medicaid Other | Attending: Emergency Medicine | Admitting: Emergency Medicine

## 2019-02-13 ENCOUNTER — Emergency Department: Payer: Medicaid Other

## 2019-02-13 ENCOUNTER — Encounter: Payer: Self-pay | Admitting: Emergency Medicine

## 2019-02-13 DIAGNOSIS — R52 Pain, unspecified: Secondary | ICD-10-CM | POA: Diagnosis present

## 2019-02-13 DIAGNOSIS — I10 Essential (primary) hypertension: Secondary | ICD-10-CM | POA: Diagnosis not present

## 2019-02-13 DIAGNOSIS — F17210 Nicotine dependence, cigarettes, uncomplicated: Secondary | ICD-10-CM | POA: Diagnosis not present

## 2019-02-13 DIAGNOSIS — Z20828 Contact with and (suspected) exposure to other viral communicable diseases: Secondary | ICD-10-CM | POA: Diagnosis not present

## 2019-02-13 DIAGNOSIS — Z79899 Other long term (current) drug therapy: Secondary | ICD-10-CM | POA: Diagnosis not present

## 2019-02-13 DIAGNOSIS — B349 Viral infection, unspecified: Secondary | ICD-10-CM | POA: Diagnosis not present

## 2019-02-13 NOTE — ED Triage Notes (Signed)
Pt in via POV, reports chills, body aches, loss of taste x 2 days.  Denies any recent exposure to Covid.  Vitals WDL, NAD noted at this time.

## 2019-02-13 NOTE — ED Provider Notes (Signed)
[facility] Emergency Department Provider Note   ____________________________________________   First MD Initiated Contact with Patient 02/13/19 1324     (approximate)  I have reviewed the triage vital signs and the nursing notes.   HISTORY  Chief Complaint Chills and Generalized Body Aches   HPI Manuel Harmon is a 48 y.o. male presents to the ED with complaint of body aches, headache, fever, chills, loss of taste and loose bowel movements for the last 2 days.  Patient is unaware of any exposure to Covid.  He has not actually taken his temperature as he does not have a thermometer.  No one in his house is sick at the time.  He has continued to drink fluids.  He rates pain as an 8 out of 10.     Past Medical History:  Diagnosis Date  . Anxiety and depression   . Depression   . Diverticulosis of colon   . GERD (gastroesophageal reflux disease)   . History of diverticulitis of colon   . Hypertension   . UTI (lower urinary tract infection)     Patient Active Problem List   Diagnosis Date Noted  . Colon cancer screening   . Tonsillopharyngitis 08/04/2017  . HTN (hypertension) 03/23/2015  . Tobacco use 03/23/2015  . Depression 03/13/2014  . MDD (major depressive disorder), recurrent severe, without psychosis ([place]) 03/13/2014  . Anxiety disorder 03/13/2014  . PTSD (post-traumatic stress disorder) 03/13/2014  . Cocaine use disorder, mild, abuse ([place]) 03/13/2014  . Cannabis use disorder, moderate, dependence ([place]) 03/13/2014  . Suture reaction 01/15/2014    Past Surgical History:  Procedure Laterality Date  . COLONOSCOPY WITH PROPOFOL N/A 12/06/2017   Procedure: COLONOSCOPY WITH PROPOFOL;  Surgeon: Lin Landsman, MD;  Location: [facility] ENDOSCOPY;  Service: Gastroenterology;  Laterality: N/A;  . LEFT COLECTOMY AND APPENDECTOMY  05-31-2007  . MOUTH SURGERY    . OLECRANON BURSECTOMY Right 09/27/2018   Procedure: OLECRANON BURSECTOMY, EXOSTOSIS  REMOVAL;  Surgeon: Leim Fabry, MD;  Location: [facility] ORS;  Service: Orthopedics;  Laterality: Right;  . RIGHT ORCHIOPEXY INGUINAL APPROACH AND HYDROCELECTOMY  1989  . SCAR REVISION N/A 01/15/2014   Procedure: SCAR REVISION AND REMOVAL OF SUTURE MATERIAL FROM ABDOMINAL WALL;  Surgeon: Armandina Gemma, MD;  Location: [facility];  Service: General;  Laterality: N/A;  . TONSILLECTOMY AND ADENOIDECTOMY N/A 09/11/2017   Procedure: TONSILLECTOMY AND POSSIBLE ADENOIDECTOMY;  Surgeon: Clyde Canterbury, MD;  Location: [place];  Service: ENT;  Laterality: N/A;  NO ADENOID TISSUE PRESENT PER MD    Prior to Admission medications   Medication Sig Start Date End Date Taking? Authorizing Provider  acetaminophen (TYLENOL) 500 MG tablet Take 2 tablets (1,000 mg total) by mouth every 8 (eight) hours. 09/27/18 09/27/19  Leim Fabry, MD  clonazePAM (KLONOPIN) 1 MG tablet Take 1 mg by mouth at bedtime.    [provider]  famotidine (PEPCID) 20 MG tablet Take 1 tablet (20 mg total) by mouth 2 (two) times daily. Patient taking differently: Take 20 mg by mouth as needed.  09/16/18   Earleen Newport, MD  lisinopril (PRINIVIL,ZESTRIL) 20 MG tablet Take 20 mg by mouth every morning.  03/17/14   [provider]  mirtazapine (REMERON) 45 MG tablet Take 45 mg by mouth at bedtime.    [provider]    Allergies Ibuprofen, Penicillins, and Wellbutrin [bupropion]  Family History  Problem Relation Age of Onset  . Drug abuse Father   .  Prostate cancer Paternal Grandfather   . Bladder Cancer Neg Hx   . Kidney cancer Neg Hx     Social History Social History   Tobacco Use  . Smoking status: Current Every Day Smoker    Packs/day: 1.00    Years: 33.00    Pack years: 33.00    Types: Cigarettes  . Smokeless tobacco: Never Used  Substance Use Topics  . Alcohol use: Yes  . Drug use: No    Review of Systems Constitutional: Positive subjective fever/chills Eyes: No  visual changes. ENT: No sore throat. Cardiovascular: Denies chest pain. Respiratory: Denies shortness of breath.  Positive for cough. Gastrointestinal: No abdominal pain.  No nausea, no vomiting.  No diarrhea.   Genitourinary: Negative for dysuria. Musculoskeletal: Positive for muscle aches. Skin: Negative for rash. Neurological: Negative for headaches, focal weakness or numbness. ____________________________________________   PHYSICAL EXAM:  VITAL SIGNS: ED Triage Vitals  Enc Vitals Group     BP 02/13/19 1311 (!) 147/96     Pulse Rate 02/13/19 1311 88     Resp 02/13/19 1311 16     Temp 02/13/19 1311 98.5 F (36.9 C)     Temp Source 02/13/19 1311 Oral     SpO2 02/13/19 1311 98 %     Weight 02/13/19 1312 195 lb (88.5 kg)     Height 02/13/19 1312 5\' 10"  (1.778 m)     Head Circumference --      Peak Flow --      Pain Score 02/13/19 1311 8     Pain Loc --      Pain Edu? --      Excl. in GC? --    Constitutional: Alert and oriented. Well appearing and in no acute distress. Eyes: Conjunctivae are normal.  Head: Atraumatic. Neck: No stridor.   Cardiovascular: Normal rate, regular rhythm. Grossly normal heart sounds.  Good peripheral circulation. Respiratory: Normal respiratory effort.  No retractions. Lungs CTAB. Gastrointestinal: Soft and nontender. No distention. Musculoskeletal: Moves upper and lower extremities that any difficulty.  No joint effusion. Neurologic:  Normal speech and language. No gross focal neurologic deficits are appreciated. No gait instability. Skin:  Skin is warm, dry and intact. No rash noted. Psychiatric: Mood and affect are normal. Speech and behavior are normal.  ____________________________________________   LABS (all labs ordered are listed, but only abnormal results are displayed)  Labs Reviewed  NOVEL CORONAVIRUS, NAA (HOSP ORDER, SEND-OUT TO REF LAB; TAT 18-24 HRS)    RADIOLOGY   Official radiology report(s): Dg Chest Portable 1 View   Result Date: 02/13/2019 CLINICAL DATA:  Cough, fever EXAM: PORTABLE CHEST 1 VIEW COMPARISON:  08/11/2016 orbital a FINDINGS: The heart size and mediastinal contours are within normal limits. Both lungs are clear. The visualized skeletal structures are unremarkable. IMPRESSION: No active disease. Electronically Signed   By: Duanne GuessNicholas  Plundo M.D.   On: 02/13/2019 14:21    ____________________________________________   PROCEDURES  Procedure(s) performed (including Critical Care):  Procedures  ____________________________________________   INITIAL IMPRESSION / ASSESSMENT AND PLAN / ED COURSE  As part of my medical decision making, I reviewed the following data within the electronic MEDICAL RECORD NUMBER Notes from prior ED visits and [place] Controlled Substance Database  Manuel Harmon was evaluated in Emergency Department on 02/13/2019 for the symptoms described in the history of present illness. He was evaluated in the context of the global COVID-19 pandemic, which necessitated consideration that the patient might be at risk for infection with the  SARS-CoV-2 virus that causes COVID-19. Institutional protocols and algorithms that pertain to the evaluation of patients at risk for COVID-19 are in a state of rapid change based on information released by regulatory bodies including the CDC and federal and state organizations. These policies and algorithms were followed during the patient's care in the ED.  48 year old male presents to the ED with complaint of subjective fever, chills and body aches.  He also states that he lost his ability to taste foods 2 days ago.  He continues to eat and drink.  He also reports some loose stools today.  Patient is unaware of any exposure to Covid.  He continues to smoke daily.  Vital signs are stable with O2 sat of 98.  Chest x-ray was negative for any acute changes.  A Covid test was done and patient is aware that he will get the results of this test in the next 24 to 48  hours.   ____________________________________________   FINAL CLINICAL IMPRESSION(S) / ED DIAGNOSES  Final diagnoses:  Viral illness     ED Discharge Orders    None       Note:  This document was prepared using Dragon voice recognition software and may include unintentional dictation errors.    Tommi Rumps, PA-C 02/13/19 1445    Chesley Noon, MD 02/14/19 [phone]

## 2019-02-13 NOTE — Discharge Instructions (Signed)
Follow-up with your primary care provider if any continued problems.  You will get a phone call to let you know if your Covid test is positive.  There currently is no medications for Covid.  Increase fluids.  Tylenol if needed for muscle aches, headache, fever and chills.  You will need to quarantine at home until you have received the test results.  If these test results are positive you will need to quarantine an additional 10 days.  This also includes anyone living with you.

## 2019-02-13 NOTE — ED Notes (Signed)
See triage note  Presents with body aches,h/a and chills  States he has been in bed for 2 days  Afebrile on arrival

## 2019-02-14 LAB — NOVEL CORONAVIRUS, NAA (HOSP ORDER, SEND-OUT TO REF LAB; TAT 18-24 HRS): SARS-CoV-2, NAA: NOT DETECTED

## 2019-11-13 ENCOUNTER — Other Ambulatory Visit: Payer: Self-pay

## 2019-11-13 ENCOUNTER — Ambulatory Visit
Admission: EM | Admit: 2019-11-13 | Discharge: 2019-11-13 | Disposition: A | Discharge status: Home / Self Care | Payer: Medicaid Other | Attending: Family Medicine | Admitting: Family Medicine

## 2019-11-13 ENCOUNTER — Encounter: Payer: Self-pay | Admitting: Emergency Medicine

## 2019-11-13 DIAGNOSIS — B349 Viral infection, unspecified: Secondary | ICD-10-CM | POA: Diagnosis present

## 2019-11-13 DIAGNOSIS — U071 COVID-19: Secondary | ICD-10-CM | POA: Insufficient documentation

## 2019-11-13 NOTE — Discharge Instructions (Signed)
OTC Zyrtec-D.  Imodium as needed for diarrhea.  Supportive care.  Take care  Dr. Adriana Simas

## 2019-11-13 NOTE — ED Triage Notes (Signed)
Patient has positive COVID exposure in his household. He is c/o nasal congestion and diarrhea that started yesterday.

## 2019-11-13 NOTE — ED Provider Notes (Signed)
[facility]    CSN: [id number] Arrival date & time: 11/13/19  1123  History   Chief Complaint Chief Complaint  Patient presents with  . Covid Exposure  . Nasal Congestion  . Diarrhea   HPI  49 year old male presents with the above complaints   Patient has been exposed to his daughter who has tested positive for COVID-19.  Children are also sick.  He reports congestion, runny nose, diarrhea.  No fever.  Patient is concerned about the possibility of COVID-19.  No relieving factors.  Past Medical History:  Diagnosis Date  . Anxiety and depression   . Depression   . Diverticulosis of colon   . GERD (gastroesophageal reflux disease)   . History of diverticulitis of colon   . Hypertension   . UTI (lower urinary tract infection)    Patient Active Problem List   Diagnosis Date Noted  . Colon cancer screening   . Tonsillopharyngitis 08/04/2017  . HTN (hypertension) 03/23/2015  . Tobacco use 03/23/2015  . Depression 03/13/2014  . 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 Anxiety disorder 03/13/2014  . PTSD (post-traumatic stress disorder) 03/13/2014  . Cocaine use disorder, mild, abuse ([facility]) 03/13/2014  . Cannabis use disorder, moderate, dependence ([facility]) 03/13/2014  . Suture reaction 01/15/2014   Past Surgical History:  Procedure Laterality Date  . COLONOSCOPY WITH PROPOFOL N/A 12/06/2017   Procedure: COLONOSCOPY WITH PROPOFOL;  Surgeon: Toney Reil, MD;  Location: [facility] ENDOSCOPY;  Service: Gastroenterology;  Laterality: N/A;  . LEFT COLECTOMY AND APPENDECTOMY  05-31-2007  . MOUTH SURGERY    . OLECRANON BURSECTOMY Right 09/27/2018   Procedure: OLECRANON BURSECTOMY, EXOSTOSIS REMOVAL;  Surgeon: Signa Kell, MD;  Location: [facility] ORS;  Service: Orthopedics;  Laterality: Right;  . RIGHT ORCHIOPEXY INGUINAL APPROACH AND HYDROCELECTOMY  1989  . SCAR REVISION N/A 01/15/2014   Procedure: SCAR REVISION AND REMOVAL OF  SUTURE MATERIAL FROM ABDOMINAL WALL;  Surgeon: Darnell Level, MD;  Location: [facility];  Service: General;  Laterality: N/A;  . TONSILLECTOMY AND ADENOIDECTOMY N/A 09/11/2017   Procedure: TONSILLECTOMY AND POSSIBLE ADENOIDECTOMY;  Surgeon: Geanie Logan, MD;  Location: [facility];  Service: ENT;  Laterality: N/A;  NO ADENOID TISSUE PRESENT PER MD   Home Medications    Prior to Admission medications   Medication Sig Start Date End Date Taking? Authorizing Provider  lisinopril (PRINIVIL,ZESTRIL) 20 MG tablet Take 20 mg by mouth every morning.  03/17/14  Yes [provider]  clonazePAM (KLONOPIN) 1 MG tablet Take 1 mg by mouth at bedtime.    [provider]  famotidine (PEPCID) 20 MG tablet Take 1 tablet (20 mg total) by mouth 2 (two) times daily. Patient taking differently: Take 20 mg by mouth as needed.  09/16/18   Emily Filbert, MD  mirtazapine (REMERON) 45 MG tablet Take 45 mg by mouth at bedtime.    [provider]    Family History Family History  Problem Relation Age of Onset  . Drug abuse Father   . Cancer Father   . Prostate cancer Paternal Grandfather   . Healthy Mother   . Bladder Cancer Neg Hx   . Kidney cancer Neg Hx     Social History Social History   Tobacco Use  . Smoking status: Current Every Day Smoker    Packs/day: 1.00    Years: 33.00    Pack years: 33.00    Types: Cigarettes  . Smokeless tobacco:  Never Used  Vaping Use  . Vaping Use: Never used  Substance Use Topics  . Alcohol use: Yes  . Drug use: Yes    Types: Marijuana     Allergies   Ibuprofen, Penicillins, and Wellbutrin [bupropion]   Review of Systems Review of Systems Per HPI  Physical Exam Triage Vital Signs ED Triage Vitals [11/13/19 1231]  Enc Vitals Group     BP      Pulse      Resp      Temp      Temp src      SpO2      Weight 200 lb (90.7 kg)     Height 5\' 10"  (1.778 m)     Head Circumference      Peak Flow      Pain  Score 0     Pain Loc      Pain Edu?      Excl. in GC?    Updated Vital Signs BP (!) 149/102 (BP Location: Right Arm)   Pulse 82   Temp 99 F (37.2 C) (Oral)   Resp 18   Ht 5\' 10"  (1.778 m)   Wt 90.7 kg   SpO2 100%   BMI 28.70 kg/m   Visual Acuity Right Eye Distance:   Left Eye Distance:   Bilateral Distance:    Right Eye Near:   Left Eye Near:    Bilateral Near:     Physical Exam Vitals and nursing note reviewed.  Constitutional:      General: He is not in acute distress.    Appearance: Normal appearance. He is not ill-appearing.  HENT:     Head: Normocephalic and atraumatic.  Eyes:     General:        Right eye: No discharge.        Left eye: No discharge.     Conjunctiva/sclera: Conjunctivae normal.  Pulmonary:     Effort: Pulmonary effort is normal.     Breath sounds: Normal breath sounds. No wheezing, rhonchi or rales.  Neurological:     Mental Status: He is alert.  Psychiatric:        Mood and Affect: Mood normal.        Behavior: Behavior normal.    UC Treatments / Results  Labs (all labs ordered are listed, but only abnormal results are displayed) Labs Reviewed  SARS CORONAVIRUS 2 (TAT 6-24 HRS)    EKG   Radiology No results found.  Procedures Procedures (including critical care time)  Medications Ordered in UC Medications - No data to display  Initial Impression / Assessment and Plan / UC Course  I have reviewed the triage vital signs and the nursing notes.  Pertinent labs & imaging results that were available during my care of the patient were reviewed by me and considered in my medical decision making (see chart for details).    49 year old male presents with a viral illness.  Suspected COVID-19.  Advised over-the-counter treatment as outlined below.  Work note given.  Final Clinical Impressions(s) / UC Diagnoses   Final diagnoses:  Viral illness     Discharge Instructions     OTC Zyrtec-D.  Imodium as needed for  diarrhea.  Supportive care.  Take care  Dr.     ED Prescriptions    None     PDMP not reviewed this encounter.   57, Adriana Simas 11/13/19 1532

## 2019-11-14 LAB — SARS CORONAVIRUS 2 (TAT 6-24 HRS): SARS Coronavirus 2: POSITIVE — AB

## 2019-11-15 ENCOUNTER — Telehealth: Payer: Self-pay | Admitting: Family

## 2019-11-15 NOTE — Telephone Encounter (Signed)
Called to Discuss with patient about Covid symptoms and the use of the monoclonal antibody infusion for those with mild to moderate Covid symptoms and at a high risk of hospitalization.     Pt appears to qualify for this infusion due to co-morbid conditions and/or a member of an at-risk group in accordance with the FDA Emergency Use Authorization.   Manuel Harmon was seen at [facility] on 11/13/19 with congestion, runny nose and diarrhea. COVID testing was positive. Seen again today at [facility] with cough, body aches, shortness of breath and fevers. Symptoms started on 8/31. Risk factors include BMI >25 and hypertension.    Unable to reach Mr. Mato to discuss [employer]. Phone did not lead to voicemail (attempted x2). No MyChart available.   Marcos Eke, NP 11/15/2019 2:22 PM

## 2020-04-17 ENCOUNTER — Emergency Department
Admission: EM | Admit: 2020-04-17 | Discharge: 2020-04-17 | Disposition: A | Discharge status: Home / Self Care | Payer: Medicaid Other | Attending: Emergency Medicine | Admitting: Emergency Medicine

## 2020-04-17 ENCOUNTER — Emergency Department: Payer: Medicaid Other

## 2020-04-17 ENCOUNTER — Other Ambulatory Visit: Payer: Self-pay

## 2020-04-17 ENCOUNTER — Encounter: Payer: Self-pay | Admitting: Emergency Medicine

## 2020-04-17 DIAGNOSIS — Z20822 Contact with and (suspected) exposure to covid-19: Secondary | ICD-10-CM | POA: Insufficient documentation

## 2020-04-17 DIAGNOSIS — F17210 Nicotine dependence, cigarettes, uncomplicated: Secondary | ICD-10-CM | POA: Insufficient documentation

## 2020-04-17 DIAGNOSIS — Z79899 Other long term (current) drug therapy: Secondary | ICD-10-CM | POA: Insufficient documentation

## 2020-04-17 DIAGNOSIS — I10 Essential (primary) hypertension: Secondary | ICD-10-CM | POA: Diagnosis not present

## 2020-04-17 DIAGNOSIS — B349 Viral infection, unspecified: Secondary | ICD-10-CM

## 2020-04-17 DIAGNOSIS — R0981 Nasal congestion: Secondary | ICD-10-CM | POA: Diagnosis present

## 2020-04-17 LAB — RESP PANEL BY RT-PCR (FLU A&B, COVID) ARPGX2
Influenza A by PCR: NEGATIVE
Influenza B by PCR: NEGATIVE
SARS Coronavirus 2 by RT PCR: NEGATIVE

## 2020-04-17 MED ORDER — BENZONATATE 100 MG PO CAPS
200.0000 mg | ORAL_CAPSULE | Freq: Once | ORAL | Status: AC
  Administered 2020-04-17: 200 mg via ORAL
  Filled 2020-04-17: qty 2

## 2020-04-17 MED ORDER — ACETAMINOPHEN 325 MG PO TABS
650.0000 mg | ORAL_TABLET | Freq: Once | ORAL | Status: AC
  Administered 2020-04-17: 650 mg via ORAL
  Filled 2020-04-17: qty 2

## 2020-04-17 MED ORDER — PSEUDOEPH-BROMPHEN-DM 30-2-10 MG/5ML PO SYRP: 5.0000 mL | ORAL_SOLUTION | Freq: Four times a day (QID) | ORAL | 0 refills | Status: DC | PRN

## 2020-04-17 MED ORDER — LIDOCAINE VISCOUS HCL 2 % MT SOLN: 5.0000 mL | Freq: Four times a day (QID) | OROMUCOSAL | 0 refills | Status: DC | PRN

## 2020-04-17 NOTE — Discharge Instructions (Addendum)
Your chest x-ray was negative for bronchitis or pneumonia.  Advised self quarantine pending results of COVID-19 test.  Follow discharge care instruction take medication as directed.  If test is positive was quarantine additional 10 days.  Advise extra strength Tylenol for fever/body aches.

## 2020-04-17 NOTE — ED Provider Notes (Signed)
[facility] Emergency Department Provider Note   ____________________________________________   Event Date/Time   First MD Initiated Contact with Patient 04/17/20 1234     (approximate)  I have reviewed the triage vital signs and the nursing notes.   HISTORY  Chief Complaint Nasal Congestion, Sore Throat, and Headache    HPI AARISH Harmon is a 50 y.o. male patient presents with nasal congestion, postnasal drainage, sore throat, headache.  Patient denies fever.  Patient denies recent travel or known contact with COVID-19.  Patient has not taken the COVID vaccine or flu shot for the season.  Rates his pain/discomfort as 8/10.  Describes his pain/discomfort as "achy".  No palliative measure for complaint.         Past Medical History:  Diagnosis Date  . Anxiety and depression   . Depression   . Diverticulosis of colon   . GERD (gastroesophageal reflux disease)   . History of diverticulitis of colon   . Hypertension   . UTI (lower urinary tract infection)     Patient Active Problem List   Diagnosis Date Noted  . Colon cancer screening   . Tonsillopharyngitis 08/04/2017  . HTN (hypertension) 03/23/2015  . Tobacco use 03/23/2015  . Depression 03/13/2014  . 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 Anxiety disorder 03/13/2014  . PTSD (post-traumatic stress disorder) 03/13/2014  . Cocaine use disorder, mild, abuse (HCC) 03/13/2014  . Cannabis use disorder, moderate, dependence (HCC) 03/13/2014  . Suture reaction 01/15/2014    Past Surgical History:  Procedure Laterality Date  . COLONOSCOPY WITH PROPOFOL N/A 12/06/2017   Procedure: COLONOSCOPY WITH PROPOFOL;  Surgeon: Toney Reil, MD;  Location: [facility] ENDOSCOPY;  Service: Gastroenterology;  Laterality: N/A;  . LEFT COLECTOMY AND APPENDECTOMY  05-31-2007  . MOUTH SURGERY    . OLECRANON BURSECTOMY Right 09/27/2018   Procedure: OLECRANON  BURSECTOMY, EXOSTOSIS REMOVAL;  Surgeon: Signa Kell, MD;  Location: [facility] ORS;  Service: Orthopedics;  Laterality: Right;  . RIGHT ORCHIOPEXY INGUINAL APPROACH AND HYDROCELECTOMY  1989  . SCAR REVISION N/A 01/15/2014   Procedure: SCAR REVISION AND REMOVAL OF SUTURE MATERIAL FROM ABDOMINAL WALL;  Surgeon: Darnell Level, MD;  Location: [facility];  Service: General;  Laterality: N/A;  . TONSILLECTOMY AND ADENOIDECTOMY N/A 09/11/2017   Procedure: TONSILLECTOMY AND POSSIBLE ADENOIDECTOMY;  Surgeon: Geanie Logan, MD;  Location: [facility];  Service: ENT;  Laterality: N/A;  NO ADENOID TISSUE PRESENT PER MD    Prior to Admission medications   Medication Sig Start Date End Date Taking? Authorizing Provider  brompheniramine-pseudoephedrine-DM 30-2-10 MG/5ML syrup Take 5 mLs by mouth 4 (four) times daily as needed. 04/17/20  Yes Joni Reining, PA-C  lidocaine (XYLOCAINE) 2 % solution Use as directed 5 mLs in the mouth or throat every 6 (six) hours as needed for mouth pain. Mix with 5 mL of Bromfed-DM for swish and swallow. 04/17/20  Yes Joni Reining, PA-C  lisinopril (PRINIVIL,ZESTRIL) 20 MG tablet Take 20 mg by mouth every morning.  03/17/14   [provider]  famotidine (PEPCID) 20 MG tablet Take 1 tablet (20 mg total) by mouth 2 (two) times daily. Patient taking differently: Take 20 mg by mouth as needed.  09/16/18 11/13/19  Emily Filbert, MD    Allergies Ibuprofen, Penicillins, and Wellbutrin [bupropion]  Family History  Problem Relation Age of Onset  . Drug abuse Father   . Cancer Father   . Prostate cancer  Paternal Grandfather   . Healthy Mother   . Bladder Cancer Neg Hx   . Kidney cancer Neg Hx     Social History Social History   Tobacco Use  . Smoking status: Current Every Day Smoker    Packs/day: 1.00    Years: 33.00    Pack years: 33.00    Types: Cigarettes  . Smokeless tobacco: Never Used  Vaping Use  . Vaping Use: Never used  Substance Use  Topics  . Alcohol use: Yes  . Drug use: Yes    Types: Marijuana    Review of Systems Constitutional: No fever/chills.  Appears malaise. Eyes: No visual changes. ENT: Nasal congestion and sore throat. Cardiovascular: Denies chest pain. Respiratory: Denies shortness of breath. Gastrointestinal: No abdominal pain.  No nausea, no vomiting.  No diarrhea.  No constipation. Genitourinary: Negative for dysuria. Musculoskeletal: Negative for back pain. Skin: Negative for rash. Neurological: Positive for headaches, but denies focal weakness or numbness. Psychiatric:  Anxiety, depression, PTSD. Endocrine:  Hypertension. Allergic/Immunilogical: Ibuprofen, penicillin, and Wellbutrin. ____________________________________________   PHYSICAL EXAM:  VITAL SIGNS: ED Triage Vitals  Enc Vitals Group     BP 04/17/20 1221 (!) 152/101     Pulse Rate 04/17/20 1221 (!) 104     Resp 04/17/20 1221 16     Temp 04/17/20 1221 98.3 F (36.8 C)     Temp Source 04/17/20 1221 Oral     SpO2 04/17/20 1221 98 %     Weight 04/17/20 1218 190 lb (86.2 kg)     Height 04/17/20 1218 5\' 11"  (1.803 m)     Head Circumference --      Peak Flow --      Pain Score 04/17/20 1217 8     Pain Loc --      Pain Edu? --      Excl. in GC? --    Constitutional: Alert and oriented. Well appearing and in no acute distress. Eyes: Conjunctivae are normal. PERRL. EOMI. Head: Atraumatic. Nose: Clear rhinorrhea. Mouth/Throat: Mucous membranes are moist.  Postnasal drainage oropharynx non-erythematous. Neck: No stridor.   Hematological/Lymphatic/Immunilogical: No cervical lymphadenopathy. Cardiovascular: Normal rate, regular rhythm. Grossly normal heart sounds.  Good peripheral circulation. Respiratory: Normal respiratory effort.  No retractions. Lungs CTAB. Gastrointestinal: Soft and nontender. No distention. No abdominal bruits. No CVA tenderness. Genitourinary: Deferred Musculoskeletal: No lower extremity tenderness nor  edema.  No joint effusions. Neurologic:  Normal speech and language. No gross focal neurologic deficits are appreciated. No gait instability. Skin:  Skin is warm, dry and intact. No rash noted. Psychiatric: Mood and affect are normal. Speech and behavior are normal.  ____________________________________________   LABS (all labs ordered are listed, but only abnormal results are displayed)  Labs Reviewed  RESP PANEL BY RT-PCR (FLU A&B, COVID) [id number]   ____________________________________________  EKG   ____________________________________________  RADIOLOGY I, 06/15/20, personally viewed and evaluated these images (plain radiographs) as part of my medical decision making, as well as reviewing the written report by the radiologist.  ED MD interpretation:   Official radiology report(s): DG Chest Portable 1 View  Result Date: 04/17/2020 CLINICAL DATA:  Cough EXAM: PORTABLE CHEST 1 VIEW COMPARISON:  February 13, 2019 FINDINGS: The cardiomediastinal silhouette is unchanged in contour.RIGHT greater than LEFT apical scarring, unchanged. No pleural effusion. No pneumothorax. No acute pleuroparenchymal abnormality. Visualized abdomen is unremarkable. No acute osseous abnormality. IMPRESSION: No acute cardiopulmonary abnormality. Electronically Signed   By: February 15, 2019 MD   On: 04/17/2020 13:16  ____________________________________________   PROCEDURES  Procedure(s) performed (including Critical Care):  Procedures   ____________________________________________   INITIAL IMPRESSION / ASSESSMENT AND PLAN / ED COURSE  As part of my medical decision making, I reviewed the following data within the electronic MEDICAL RECORD NUMBER         Patient presents with nasal congestion, postnasal drainage, sore throat, headache.  Discussed no acute findings on chest x-ray.  Advised patient to COVID-19 influenza test pending.  Patient advised to self quarantine pending results of  COVID-19 results.  Patient advised results can find later today in the MyChart app.  Patient given discharge care instruction take medication as directed.  Return to ED if condition worsens.      ____________________________________________   FINAL CLINICAL IMPRESSION(S) / ED DIAGNOSES  Final diagnoses:  Viral illness     ED Discharge Orders         Ordered    brompheniramine-pseudoephedrine-DM 30-2-10 MG/5ML syrup  4 times daily PRN        04/17/20 1348    lidocaine (XYLOCAINE) 2 % solution  Every 6 hours PRN        04/17/20 1349          *Please note:  VLAD MAYBERRY was evaluated in Emergency Department on 04/17/2020 for the symptoms described in the history of present illness. He was evaluated in the context of the global COVID-19 pandemic, which necessitated consideration that the patient might be at risk for infection with the SARS-CoV-2 virus that causes COVID-19. Institutional protocols and algorithms that pertain to the evaluation of patients at risk for COVID-19 are in a state of rapid change based on information released by regulatory bodies including the CDC and federal and state organizations. These policies and algorithms were followed during the patient's care in the ED.  Some ED evaluations and interventions may be delayed as a result of limited staffing during and the pandemic.*   Note:  This document was prepared using Dragon voice recognition software and may include unintentional dictation errors.    Joni Reining, PA-C 04/17/20 1355    Jene Every, MD 04/17/20 [phone]

## 2020-04-17 NOTE — ED Triage Notes (Signed)
Pt reports yesterday started with nasal congestion and drainage, sore throat and headache. Pt denies fevers

## 2020-06-15 ENCOUNTER — Other Ambulatory Visit: Payer: Self-pay

## 2020-06-15 ENCOUNTER — Emergency Department
Admission: EM | Admit: 2020-06-15 | Discharge: 2020-06-15 | Disposition: A | Discharge status: Home / Self Care | Payer: Medicaid Other | Attending: Emergency Medicine | Admitting: Emergency Medicine

## 2020-06-15 ENCOUNTER — Encounter: Payer: Self-pay | Admitting: *Deleted

## 2020-06-15 DIAGNOSIS — F17210 Nicotine dependence, cigarettes, uncomplicated: Secondary | ICD-10-CM | POA: Diagnosis not present

## 2020-06-15 DIAGNOSIS — M25511 Pain in right shoulder: Secondary | ICD-10-CM | POA: Diagnosis present

## 2020-06-15 DIAGNOSIS — M7581 Other shoulder lesions, right shoulder: Secondary | ICD-10-CM

## 2020-06-15 DIAGNOSIS — M7591 Shoulder lesion, unspecified, right shoulder: Secondary | ICD-10-CM | POA: Diagnosis not present

## 2020-06-15 DIAGNOSIS — Z79899 Other long term (current) drug therapy: Secondary | ICD-10-CM | POA: Diagnosis not present

## 2020-06-15 DIAGNOSIS — I10 Essential (primary) hypertension: Secondary | ICD-10-CM | POA: Diagnosis not present

## 2020-06-15 MED ORDER — MELOXICAM 15 MG PO TABS: 15.0000 mg | ORAL_TABLET | Freq: Every day | ORAL | 0 refills | Status: DC

## 2020-06-15 MED ORDER — HYDROCODONE-ACETAMINOPHEN 5-325 MG PO TABS: 1.0000 | ORAL_TABLET | ORAL | 0 refills | Status: DC | PRN

## 2020-06-15 NOTE — ED Triage Notes (Signed)
Pt drove self to the ED today.

## 2020-06-15 NOTE — ED Provider Notes (Signed)
[facility] Emergency Department Provider Note  ____________________________________________  Time seen: Approximately 4:37 PM  I have reviewed the triage vital signs and the nursing notes.   HISTORY  Chief Complaint Shoulder Pain (Right shoulder pain)    HPI Manuel Harmon is a 50 y.o. male who presents the emergency department complaining of right shoulder pain.  Patient states that he has had ongoing issues with the right shoulder, has been followed by orthopedics for spurs in the right shoulder.  Patient states that these are in the acromioclavicular joint space.  He has received multiple rounds of cortisone injections and states that they are becoming less effective until this last 1 which has not provided any relief.  Patient is already had conversations with orthopedic surgery about surgical intervention on this.  Patient states that he is scheduled to see surgery in 7 days to schedule.  Patient has had no recent injuries.  No radiation into the neck or down the arm currently.  Patient is using over-the-counter medications as well as gabapentin with no symptom relief.  Patient is mainly looking for symptom control until he Manuel see his surgeon in 1 week's time.         Past Medical History:  Diagnosis Date  . Anxiety and depression   . Depression   . Diverticulosis of colon   . GERD (gastroesophageal reflux disease)   . History of diverticulitis of colon   . Hypertension   . UTI (lower urinary tract infection)     Patient Active Problem List   Diagnosis Date Noted  . Colon cancer screening   . Tonsillopharyngitis 08/04/2017  . HTN (hypertension) 03/23/2015  . Tobacco use 03/23/2015  . Depression 03/13/2014  . 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 Anxiety disorder 03/13/2014  . PTSD (post-traumatic stress disorder) 03/13/2014  . Cocaine use disorder, mild, abuse (HCC) 03/13/2014  . Cannabis use  disorder, moderate, dependence (HCC) 03/13/2014  . Suture reaction 01/15/2014    Past Surgical History:  Procedure Laterality Date  . COLONOSCOPY WITH PROPOFOL N/A 12/06/2017   Procedure: COLONOSCOPY WITH PROPOFOL;  Surgeon: Toney Reil, MD;  Location: [facility] ENDOSCOPY;  Service: Gastroenterology;  Laterality: N/A;  . LEFT COLECTOMY AND APPENDECTOMY  05-31-2007  . MOUTH SURGERY    . OLECRANON BURSECTOMY Right 09/27/2018   Procedure: OLECRANON BURSECTOMY, EXOSTOSIS REMOVAL;  Surgeon: Signa Kell, MD;  Location: [facility] ORS;  Service: Orthopedics;  Laterality: Right;  . RIGHT ORCHIOPEXY INGUINAL APPROACH AND HYDROCELECTOMY  1989  . SCAR REVISION N/A 01/15/2014   Procedure: SCAR REVISION AND REMOVAL OF SUTURE MATERIAL FROM ABDOMINAL WALL;  Surgeon: Darnell Level, MD;  Location: [facility];  Service: General;  Laterality: N/A;  . TONSILLECTOMY AND ADENOIDECTOMY N/A 09/11/2017   Procedure: TONSILLECTOMY AND POSSIBLE ADENOIDECTOMY;  Surgeon: Geanie Logan, MD;  Location: [facility];  Service: ENT;  Laterality: N/A;  NO ADENOID TISSUE PRESENT PER MD    Prior to Admission medications   Medication Sig Start Date End Date Taking? Authorizing Provider  HYDROcodone-acetaminophen (NORCO/VICODIN) 5-325 MG tablet Take 1 tablet by mouth every 4 (four) hours as needed for moderate pain. 06/15/20 06/15/21 Yes Marios Gaiser, Delorise Royals, PA-C  meloxicam (MOBIC) 15 MG tablet Take 1 tablet (15 mg total) by mouth daily. 06/15/20  Yes Donae Kueker, Delorise Royals, PA-C  lisinopril (PRINIVIL,ZESTRIL) 20 MG tablet Take 20 mg by mouth every morning.  03/17/14   [provider]  famotidine (PEPCID) 20 MG tablet  Take 1 tablet (20 mg total) by mouth 2 (two) times daily. Patient taking differently: Take 20 mg by mouth as needed.  09/16/18 11/13/19  Emily Filbert, MD    Allergies Ibuprofen, Penicillins, and Wellbutrin [bupropion]  Family History  Problem Relation Age of Onset  . Drug abuse Father    . Cancer Father   . Prostate cancer Paternal Grandfather   . Healthy Mother   . Bladder Cancer Neg Hx   . Kidney cancer Neg Hx     Social History Social History   Tobacco Use  . Smoking status: Current Every Day Smoker    Packs/day: 1.00    Years: 33.00    Pack years: 33.00    Types: Cigarettes  . Smokeless tobacco: Never Used  Vaping Use  . Vaping Use: Never used  Substance Use Topics  . Alcohol use: Yes  . Drug use: Yes    Types: Marijuana     Review of Systems  Constitutional: No fever/chills Eyes: No visual changes. No discharge ENT: No upper respiratory complaints. Cardiovascular: no chest pain. Respiratory: no cough. No SOB. Gastrointestinal: No abdominal pain.  No nausea, no vomiting.  No diarrhea.  No constipation. Musculoskeletal: Positive for right shoulder pain, known osseous spurs in the acromioclavicular joint Skin: Negative for rash, abrasions, lacerations, ecchymosis. Neurological: Negative for headaches, focal weakness or numbness.  10 System ROS otherwise negative.  ____________________________________________   PHYSICAL EXAM:  VITAL SIGNS: ED Triage Vitals  Enc Vitals Group     BP 06/15/20 1604 (!) 157/98     Pulse Rate 06/15/20 1604 92     Resp 06/15/20 1604 20     Temp 06/15/20 1604 98.1 F (36.7 C)     Temp Source 06/15/20 1604 Oral     SpO2 06/15/20 1604 96 %     Weight 06/15/20 1608 195 lb (88.5 kg)     Height 06/15/20 1608 5\' 10"  (1.778 m)     Head Circumference --      Peak Flow --      Pain Score 06/15/20 1607 10     Pain Loc --      Pain Edu? --      Excl. in GC? --      Constitutional: Alert and oriented. Well appearing and in no acute distress. Eyes: Conjunctivae are normal. PERRL. EOMI. Head: Atraumatic. ENT:      Ears:       Nose: No congestion/rhinnorhea.      Mouth/Throat: Mucous membranes are moist.  Neck: No stridor.  No cervical spine tenderness to palpation.  Cardiovascular: Normal rate, regular rhythm.  Normal S1 and S2.  Good peripheral circulation. Respiratory: Normal respiratory effort without tachypnea or retractions. Lungs CTAB. Good air entry to the bases with no decreased or absent breath sounds. Musculoskeletal: Full range of motion to all extremities. No gross deformities appreciated.  Visualization of the right shoulder reveals no visible abnormality.  Palpation along the clavicle reveals no tenderness.  No palpable mobile tenderness over the superior posterior aspect of the shoulder itself.  Patient does have some palpable findings in the acromioclavicular joint space consistent with spurring.  Good range of motion.  Patient is tender to palpation of the acromioclavicular joint space without any other tenderness.  Examination of cervical spine and elbows unremarkable.  Radial pulse and sensation intact distally. Neurologic:  Normal speech and language. No gross focal neurologic deficits are appreciated.  Skin:  Skin is warm, dry and intact. No rash noted.  Psychiatric: Mood and affect are normal. Speech and behavior are normal. Patient exhibits appropriate insight and judgement.   ____________________________________________   LABS (all labs ordered are listed, but only abnormal results are displayed)  Labs Reviewed - No data to display ____________________________________________  EKG   ____________________________________________  RADIOLOGY   No results found.  ____________________________________________    PROCEDURES  Procedure(s) performed:    Procedures    Medications - No data to display   ____________________________________________   INITIAL IMPRESSION / ASSESSMENT AND PLAN / ED COURSE  Pertinent labs & imaging results that were available during my care of the patient were reviewed by me and considered in my medical decision making (see chart for details).  Review of the [place] CSRS was performed in accordance of the [facility] prior to dispensing any  controlled drugs.           Patient's diagnosis is consistent with bone spur of the [facility] joint right shoulder.  Patient presented to the emergency department complaining of right shoulder pain.  Patient has been followed by orthopedics for same.  He has a known bone spur in the [facility] joint on the right side.  Has been receiving cortisone injections which have been effective until this last injection which had no symptom improvement.  Patient has had multiple conversations with orthopedics about surgical intervention and states that he is going to be seeing his orthopedic surgeon in 1 week's time to schedule the surgery.  Patient is here for pain control.  Patient has been using over-the-counter medications as well as gabapentin with no relief.  No indications for further work-up currently with no indications for imaging or labs.  Patient will have oral anti-inflammatory as well as very limited prescription of Vicodin.  Patient is already scheduled to see his orthopedic surgeon in 1 week's time to schedule surgery.  Return precautions discussed with the patient. Patient is given ED precautions to return to the ED for any worsening or new symptoms.     ____________________________________________  FINAL CLINICAL IMPRESSION(S) / ED DIAGNOSES  Final diagnoses:  Bone spur of right acromioclavicular joint      NEW MEDICATIONS STARTED DURING THIS VISIT:  ED Discharge Orders         Ordered    meloxicam (MOBIC) 15 MG tablet  Daily        06/15/20 1641    HYDROcodone-acetaminophen (NORCO/VICODIN) 5-325 MG tablet  Every 4 hours PRN        06/15/20 1643              This chart was dictated using voice recognition software/Dragon. Despite best efforts to proofread, errors Manuel occur which Manuel change the meaning. Any change was purely unintentional.    Racheal Patches, PA-C 06/15/20 1809    Delton Prairie, MD 06/15/20 2218

## 2020-06-15 NOTE — ED Triage Notes (Signed)
Pt has been getting cortisone shots in R shoulder for pain from a bone spur. Pt states he's been treated for the bone spur for a year and has an appt next week with ortho. Pt has been receiving cortisone shots and last one was approximately 3 wks ago. Pt has been taking OTC for pain control w/o relief.

## 2020-08-25 ENCOUNTER — Other Ambulatory Visit: Payer: Self-pay

## 2020-08-25 ENCOUNTER — Emergency Department
Admission: EM | Admit: 2020-08-25 | Discharge: 2020-08-25 | Disposition: A | Discharge status: Home / Self Care | Payer: Medicaid Other | Attending: Emergency Medicine | Admitting: Emergency Medicine

## 2020-08-25 ENCOUNTER — Emergency Department: Payer: Medicaid Other

## 2020-08-25 DIAGNOSIS — R55 Syncope and collapse: Secondary | ICD-10-CM | POA: Diagnosis not present

## 2020-08-25 DIAGNOSIS — Z79899 Other long term (current) drug therapy: Secondary | ICD-10-CM | POA: Insufficient documentation

## 2020-08-25 DIAGNOSIS — E860 Dehydration: Secondary | ICD-10-CM | POA: Diagnosis not present

## 2020-08-25 DIAGNOSIS — R0789 Other chest pain: Secondary | ICD-10-CM | POA: Insufficient documentation

## 2020-08-25 DIAGNOSIS — R079 Chest pain, unspecified: Secondary | ICD-10-CM | POA: Diagnosis present

## 2020-08-25 DIAGNOSIS — I10 Essential (primary) hypertension: Secondary | ICD-10-CM | POA: Insufficient documentation

## 2020-08-25 DIAGNOSIS — F17210 Nicotine dependence, cigarettes, uncomplicated: Secondary | ICD-10-CM | POA: Diagnosis not present

## 2020-08-25 LAB — BASIC METABOLIC PANEL
Anion gap: 9 (ref 5–15)
BUN: 22 mg/dL — ABNORMAL HIGH (ref 6–20)
CO2: 23 mmol/L (ref 22–32)
Calcium: 8.7 mg/dL — ABNORMAL LOW (ref 8.9–10.3)
Chloride: 107 mmol/L (ref 98–111)
Creatinine, Ser: 1.47 mg/dL — ABNORMAL HIGH (ref 0.61–1.24)
GFR, Estimated: 58 mL/min — ABNORMAL LOW (ref >60)
Glucose, Bld: 170 mg/dL — ABNORMAL HIGH (ref 70–99)
Potassium: 3.6 mmol/L (ref 3.5–5.1)
Sodium: 139 mmol/L (ref 135–145)

## 2020-08-25 LAB — CBC
HCT: 46.1 % (ref 39.0–52.0)
Hemoglobin: 15.9 g/dL (ref 13.0–17.0)
MCH: 30.9 pg (ref 26.0–34.0)
MCHC: 34.5 g/dL (ref 30.0–36.0)
MCV: 89.5 fL (ref 80.0–100.0)
Platelets: 312 10*3/uL (ref 150–400)
RBC: 5.15 MIL/uL (ref 4.22–5.81)
RDW: 14.7 % (ref 11.5–15.5)
WBC: 6.9 10*3/uL (ref 4.0–10.5)
nRBC: 0 % (ref 0.0–0.2)

## 2020-08-25 LAB — TROPONIN I (HIGH SENSITIVITY): Troponin I (High Sensitivity): 3 ng/L (ref <18)

## 2020-08-25 MED ORDER — NAPROXEN 500 MG PO TABS: 500.0000 mg | ORAL_TABLET | Freq: Two times a day (BID) | ORAL | 0 refills | Status: DC

## 2020-08-25 NOTE — ED Triage Notes (Signed)
Pt c/o sharp shooting left sided chest pain that started a couple days ago , states last night he had a syncopal episode while sitting a in chair with diaphoresis. Pt is ambulatory with a steady gait.Marland Kitchen

## 2020-08-25 NOTE — ED Notes (Signed)
First Nurse Note: Pt to ED via POV stating that he is having sharp chest pain. Pt reports syncopal episode last night as well.

## 2020-08-25 NOTE — ED Provider Notes (Signed)
[facility] Emergency Department Provider Note  ____________________________________________  Time seen: Approximately 11:03 AM  I have reviewed the triage vital signs and the nursing notes.   HISTORY  Chief Complaint Chest Pain    HPI LAAKEA PEREIRA is a 50 y.o. male with a history of anxiety depression GERD hypertension who comes the ED complaining of left-sided chest pain that started after moving a lot of heavy logs outside in 100 degree heat.  After completing the test, he went inside and sat down and then passed out briefly.  Chest pain is intermittent, fleeting lasting a second at a time, sharp.  No significant radiation.  No shortness of breath or vomiting.  Not exertional.  Not pleuritic.  No recent cocaine.    Past Medical History:  Diagnosis Date   Anxiety and depression    Depression    Diverticulosis of colon    GERD (gastroesophageal reflux disease)    History of diverticulitis of colon    Hypertension    UTI (lower urinary tract infection)      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   Anxiety disorder 03/13/2014   PTSD (post-traumatic stress disorder) 03/13/2014   Cocaine use disorder, mild, abuse (HCC) 03/13/2014   Cannabis use disorder, moderate, dependence (HCC) 03/13/2014   Suture reaction 01/15/2014     Past Surgical History:  Procedure Laterality Date   COLONOSCOPY WITH PROPOFOL N/A 12/06/2017   Procedure: COLONOSCOPY WITH PROPOFOL;  Surgeon: Toney Reil, MD;  Location: [facility];  Service: Gastroenterology;  Laterality: N/A;   LEFT COLECTOMY AND APPENDECTOMY  05-31-2007   MOUTH SURGERY     OLECRANON BURSECTOMY Right 09/27/2018   Procedure: OLECRANON BURSECTOMY, EXOSTOSIS  REMOVAL;  Surgeon: Signa Kell, MD;  Location: [facility] OR[facility];  Service: Orthopedics;  Laterality: Right;   RIGHT ORCHIOPEXY INGUINAL APPROACH AND HYDROCELECTOMY  1989   SCAR REVISION N/A 01/15/2014   Procedure: SCAR REVISION AND REMOVAL OF SUTURE MATERIAL FROM ABDOMINAL WALL;  Surgeon: Darnell Level, MD;  Location: [facility];  Service: General;  Laterality: N/A;   TONSILLECTOMY AND ADENOIDECTOMY N/A 09/11/2017   Procedure: TONSILLECTOMY AND POSSIBLE ADENOIDECTOMY;  Surgeon: Geanie Logan, MD;  Location: [facility];  Service: ENT;  Laterality: N/A;  NO ADENOID TISSUE PRESENT PER MD     Prior to Admission medications   Medication Sig Start Date End Date Taking? Authorizing Provider  HYDROcodone-acetaminophen (NORCO/VICODIN) 5-325 MG tablet Take 1 tablet by mouth every 4 (four) hours as needed for moderate pain. 06/15/20 06/15/21  Cuthriell, Delorise Royals, PA-C  lisinopril (PRINIVIL,ZESTRIL) 20 MG tablet Take 20 mg by mouth every morning.  03/17/14   [provider]  meloxicam (MOBIC) 15 MG tablet Take 1 tablet (15 mg total) by mouth daily. 06/15/20   Cuthriell, Delorise Royals, PA-C  famotidine (PEPCID) 20 MG tablet Take 1 tablet (20 mg total) by mouth 2 (two) times daily. Patient taking differently: Take 20 mg by mouth as needed.  09/16/18 11/13/19  Emily Filbert, MD     Allergies Ibuprofen, Penicillins, and Wellbutrin [bupropion]   Family History  Problem Relation Age of Onset   Drug abuse Father    Cancer Father    Prostate cancer Paternal Grandfather    Healthy Mother  Bladder Cancer Neg Hx    Kidney cancer Neg Hx     Social History Social History   Tobacco Use   Smoking status: Every Day    Packs/day: 1.00    Years: 33.00    Pack years: 33.00    Types: Cigarettes   Smokeless tobacco: Never  Vaping Use   Vaping Use: Never used  Substance Use Topics   Alcohol use: Yes   Drug use: Yes    Types: Marijuana    Review of Systems  Constitutional:   No  fever or chills.  ENT:   No sore throat. No rhinorrhea. Cardiovascular:   Positive chest pain as above, positive syncope. Respiratory:   No dyspnea or cough. Gastrointestinal:   Negative for abdominal pain, vomiting and diarrhea.  Musculoskeletal:   Negative for focal pain or swelling All other systems reviewed and are negative except as documented above in ROS and HPI.  ____________________________________________   PHYSICAL EXAM:  VITAL SIGNS: ED Triage Vitals [08/25/20 0757]  Enc Vitals Group     BP (!) 146/101     Pulse Rate 86     Resp 16     Temp 98.2 F (36.8 C)     Temp Source Oral     SpO2 99 %     Weight 185 lb (83.9 kg)     Height 5\' 10"  (1.778 m)     Head Circumference      Peak Flow      Pain Score 8     Pain Loc      Pain Edu?      Excl. in GC?     Vital signs reviewed, nursing assessments reviewed.   Constitutional:   Alert and oriented. Non-toxic appearance. Eyes:   Conjunctivae are normal. EOMI. PERRL. ENT      Head:   Normocephalic and atraumatic.      Nose:   Wearing a mask.      Mouth/Throat:   Wearing a mask.      Neck:   No meningismus. Full ROM. Hematological/Lymphatic/Immunilogical:   No cervical lymphadenopathy. Cardiovascular:   RRR. Symmetric bilateral radial and DP pulses.  No murmurs, rubs, or crunch. Cap refill less than 2 seconds. Respiratory:   Normal respiratory effort without tachypnea/retractions. Breath sounds are clear and equal bilaterally. No wheezes/rales/rhonchi. Gastrointestinal:   Soft and nontender. Non distended. There is no CVA tenderness.  No rebound, rigidity, or guarding. Genitourinary:   deferred Musculoskeletal:   Normal range of motion in all extremities.  No lower extremity tenderness.  No edema.  Left anterior chest wall tender to the touch over the fifth and sixth intercostal space reproducing his pain Neurologic:   Normal speech and language.  Motor grossly intact. No acute focal neurologic deficits are  appreciated.   ____________________________________________    LABS (pertinent positives/negatives) (all labs ordered are listed, but only abnormal results are displayed) Labs Reviewed  BASIC METABOLIC PANEL - Abnormal; Notable for the following components:      Result Value   Glucose, Bld 170 (*)    BUN 22 (*)    Creatinine, Ser 1.47 (*)    Calcium 8.7 (*)    GFR, Estimated 58 (*)    All other components within normal limits  CBC  TROPONIN I (HIGH SENSITIVITY)   ____________________________________________   EKG  Interpreted by me Normal sinus rhythm rate of 80.  Normal axis and intervals.  Normal QRS ST segments and T waves.  No ischemic changes.  ____________________________________________    RADIOLOGY  DG Chest 2 View  Result Date: 08/25/2020 CLINICAL DATA:  Lambert Mody shooting LEFT side chest pain beginning a couple days ago, syncopal episode last night with diaphoresis, worked outside in the heat yesterday. EXAM: CHEST - 2 VIEW COMPARISON:  04/17/2020 FINDINGS: Normal heart size, mediastinal contours, and pulmonary vascularity. Lungs clear. No pleural effusion or pneumothorax. Bones unremarkable. IMPRESSION: Normal exam. Electronically Signed   By: Ulyses Southward M.D.   On: 08/25/2020 08:49    ____________________________________________   PROCEDURES Procedures  ____________________________________________    CLINICAL IMPRESSION / ASSESSMENT AND PLAN / ED COURSE  Medications ordered in the ED: Medications - No data to display  Pertinent labs & imaging results that were available during my care of the patient were reviewed by me and considered in my medical decision making (see chart for details).  KORT STETTLER was evaluated in Emergency Department on 08/25/2020 for the symptoms described in the history of present illness. He was evaluated in the context of the global COVID-19 pandemic, which necessitated consideration that the patient might be at risk for infection  with the SARS-CoV-2 virus that causes COVID-19. Institutional protocols and algorithms that pertain to the evaluation of patients at risk for COVID-19 are in a state of rapid change based on information released by regulatory bodies including the CDC and federal and state organizations. These policies and algorithms were followed during the patient's care in the ED.   Patient presents with atypical fleeting chest pain, clinically found to be chest wall related, likely muscle strain.  Also suspect dehydration as the cause of his syncope episode.  Vital signs and EKG unremarkable, labs normal.  Troponin was sent as part of the triage protocol and normal.  However, symptoms are noncardiac, cardiac work-up not indicated.  Stable for discharge, recommended rest and hydration today, anti-inflammatories and conservative care for chest wall strain.      ____________________________________________   FINAL CLINICAL IMPRESSION(S) / ED DIAGNOSES    Final diagnoses:  Chest wall pain  Syncope, unspecified syncope type  Dehydration     ED Discharge Orders     None       Portions of this note were generated with dragon dictation software. Dictation errors may occur despite best attempts at proofreading.   Sharman Cheek, MD 08/25/20 [phone]

## 2020-12-06 ENCOUNTER — Emergency Department: Payer: Medicaid Other

## 2020-12-06 ENCOUNTER — Other Ambulatory Visit: Payer: Self-pay

## 2020-12-06 ENCOUNTER — Emergency Department
Admission: EM | Admit: 2020-12-06 | Discharge: 2020-12-06 | Disposition: A | Discharge status: Home / Self Care | Payer: Medicaid Other | Attending: Emergency Medicine | Admitting: Emergency Medicine

## 2020-12-06 DIAGNOSIS — R319 Hematuria, unspecified: Secondary | ICD-10-CM | POA: Insufficient documentation

## 2020-12-06 DIAGNOSIS — K219 Gastro-esophageal reflux disease without esophagitis: Secondary | ICD-10-CM | POA: Insufficient documentation

## 2020-12-06 DIAGNOSIS — F17210 Nicotine dependence, cigarettes, uncomplicated: Secondary | ICD-10-CM | POA: Diagnosis not present

## 2020-12-06 DIAGNOSIS — Z79899 Other long term (current) drug therapy: Secondary | ICD-10-CM | POA: Diagnosis not present

## 2020-12-06 DIAGNOSIS — R109 Unspecified abdominal pain: Secondary | ICD-10-CM

## 2020-12-06 DIAGNOSIS — R1032 Left lower quadrant pain: Secondary | ICD-10-CM | POA: Diagnosis present

## 2020-12-06 DIAGNOSIS — I10 Essential (primary) hypertension: Secondary | ICD-10-CM | POA: Insufficient documentation

## 2020-12-06 LAB — COMPREHENSIVE METABOLIC PANEL
ALT: 17 U/L (ref 0–44)
AST: 22 U/L (ref 15–41)
Albumin: 3.5 g/dL (ref 3.5–5.0)
Alkaline Phosphatase: 62 U/L (ref 38–126)
Anion gap: 5 (ref 5–15)
BUN: 12 mg/dL (ref 6–20)
CO2: 29 mmol/L (ref 22–32)
Calcium: 9 mg/dL (ref 8.9–10.3)
Chloride: 106 mmol/L (ref 98–111)
Creatinine, Ser: 1.36 mg/dL — ABNORMAL HIGH (ref 0.61–1.24)
GFR, Estimated: >60 mL/min (ref >60)
Glucose, Bld: 125 mg/dL — ABNORMAL HIGH (ref 70–99)
Potassium: 4.1 mmol/L (ref 3.5–5.1)
Sodium: 140 mmol/L (ref 135–145)
Total Bilirubin: 0.7 mg/dL (ref 0.3–1.2)
Total Protein: 6.1 g/dL — ABNORMAL LOW (ref 6.5–8.1)

## 2020-12-06 LAB — CBC
HCT: 49.1 % (ref 39.0–52.0)
Hemoglobin: 16.7 g/dL (ref 13.0–17.0)
MCH: 30.2 pg (ref 26.0–34.0)
MCHC: 34 g/dL (ref 30.0–36.0)
MCV: 88.8 fL (ref 80.0–100.0)
Platelets: 295 10*3/uL (ref 150–400)
RBC: 5.53 MIL/uL (ref 4.22–5.81)
RDW: 14.5 % (ref 11.5–15.5)
WBC: 5.5 10*3/uL (ref 4.0–10.5)
nRBC: 0 % (ref 0.0–0.2)

## 2020-12-06 LAB — URINALYSIS, COMPLETE (UACMP) WITH MICROSCOPIC
Bacteria, UA: NONE SEEN
Bilirubin Urine: NEGATIVE
Glucose, UA: NEGATIVE mg/dL
Ketones, ur: NEGATIVE mg/dL
Nitrite: NEGATIVE
Protein, ur: NEGATIVE mg/dL
Specific Gravity, Urine: 1.019 (ref 1.005–1.030)
pH: 5 (ref 5.0–8.0)

## 2020-12-06 LAB — LIPASE, BLOOD: Lipase: 33 U/L (ref 11–51)

## 2020-12-06 LAB — TROPONIN I (HIGH SENSITIVITY): Troponin I (High Sensitivity): 3 ng/L (ref <18)

## 2020-12-06 MED ORDER — IOHEXOL 350 MG/ML SOLN
80.0000 mL | Freq: Once | INTRAVENOUS | Status: AC | PRN
  Administered 2020-12-06: 80 mL via INTRAVENOUS
  Filled 2020-12-06: qty 80

## 2020-12-06 MED ORDER — DICYCLOMINE HCL 10 MG PO CAPS: 10.0000 mg | ORAL_CAPSULE | Freq: Four times a day (QID) | ORAL | 0 refills | Status: AC

## 2020-12-06 MED ORDER — MORPHINE SULFATE (PF) 4 MG/ML IV SOLN
6.0000 mg | Freq: Once | INTRAVENOUS | Status: AC
Start: 2020-12-06 — End: 2020-12-06
  Administered 2020-12-06: 6 mg via INTRAVENOUS
  Filled 2020-12-06: qty 2

## 2020-12-06 NOTE — Discharge Instructions (Addendum)
Your CT today showed: 1. No acute abnormality. No evidence of bowel obstruction or acute  bowel inflammation. Stable postsurgical changes from subtotal left  colectomy. Mild colonic diverticulosis, with no evidence of acute  diverticulitis.  2. Indeterminate chronic 2.3 cm soft tissue density mass along the  anterior coccyx, minimally increased in size since 11/08/2017 CT,  cannot exclude slow growing neoplasm such as a teratoma or other  neoplastic etiology.   Your urinalysis also had some microscopic blood on it.  Need to follow this up with her primary care doctor.

## 2020-12-06 NOTE — ED Triage Notes (Signed)
Pt comes with c/o left sided abdominal pain. Pt states this started three days ago. Pt states soft stools and they are irregular. Pt denies any N/V.  Pt states sweats and chills.

## 2020-12-06 NOTE — ED Provider Notes (Signed)
[facility] Emergency Department Provider Note  ____________________________________________   Event Date/Time   First MD Initiated Contact with Patient 12/06/20 1103     (approximate)  I have reviewed the triage vital signs and the nursing notes.   HISTORY  Chief Complaint Abdominal Pain   HPI Manuel Harmon is a 50 y.o. male with a past medical history anxiety, depression, GERD, HTN and diverticulitis status post partial colectomy with history of an appendectomy presents for assessment approximately 4 days of some left lower quadrant pain and some soreness in the left chest.  Patient states the symptoms started around the similar time.  States the pain in his chest is worse when he is extending his arms behind his torso but otherwise cannot identify any other alleviating factors.  He denies any headache, earache, sore throat, nausea, vomiting, diarrhea, burning with urination, blood in stool, blood in his urine, constipation, cough, right abdominal pain, upper abdominal pain or pain in his back.  No recent injuries or falls.  He denies regular EtOH or NSAID use.  No other acute concerns at this time.         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   Anxiety disorder 03/13/2014   PTSD (post-traumatic stress disorder) 03/13/2014   Cocaine use disorder, mild, abuse (HCC) 03/13/2014   Cannabis use disorder, moderate, dependence (HCC) 03/13/2014   Suture reaction 01/15/2014     Past Surgical History:  Procedure Laterality Date   COLONOSCOPY WITH PROPOFOL N/A 12/06/2017   Procedure: COLONOSCOPY WITH PROPOFOL;  Surgeon: Toney Reil, MD;  Location: [facility];  Service: Gastroenterology;  Laterality: N/A;   LEFT COLECTOMY AND APPENDECTOMY  05-31-2007   MOUTH SURGERY     OLECRANON BURSECTOMY Right 09/27/2018   Procedure: OLECRANON BURSECTOMY, EXOSTOSIS REMOVAL;  Surgeon: Signa Kell, MD;  Location: [facility];  Service: Orthopedics;  Laterality: Right;   RIGHT ORCHIOPEXY INGUINAL APPROACH AND HYDROCELECTOMY  1989   SCAR REVISION N/A 01/15/2014   Procedure: SCAR REVISION AND REMOVAL OF SUTURE MATERIAL FROM ABDOMINAL WALL;  Surgeon: Darnell Level, MD;  Location: [facility];  Service: General;  Laterality: N/A;   TONSILLECTOMY AND ADENOIDECTOMY N/A 09/11/2017   Procedure: TONSILLECTOMY AND POSSIBLE ADENOIDECTOMY;  Surgeon: Geanie Logan, MD;  Location: [facility];  Service: ENT;  Laterality: N/A;  NO ADENOID TISSUE PRESENT PER MD    Prior to Admission medications   Medication Sig Start Date End Date Taking? Authorizing Provider  dicyclomine (BENTYL) 10 MG capsule Take 1 capsule (10 mg total) by mouth 4 (four) times daily for 3 days. 12/06/20 12/09/20 Yes Gilles Chiquito, MD  lisinopril (PRINIVIL,ZESTRIL) 20 MG tablet Take 20 mg by mouth every morning.  03/17/14   [provider]  famotidine (PEPCID) 20 MG tablet Take 1 tablet (20 mg total) by mouth 2 (two) times daily. Patient taking differently: Take 20 mg by mouth as needed.  09/16/18 11/13/19  Emily Filbert, MD  Allergies Ibuprofen, Penicillins, and Wellbutrin [bupropion]  Family History  Problem Relation Age of Onset   Drug abuse Father    Cancer Father    Prostate cancer Paternal Grandfather    Healthy Mother    Bladder Cancer Neg Hx    Kidney cancer Neg Hx     Social History Social History   Tobacco Use   Smoking status: Every Day    Packs/day: 1.00     Years: 33.00    Pack years: 33.00    Types: Cigarettes   Smokeless tobacco: Never  Vaping Use   Vaping Use: Never used  Substance Use Topics   Alcohol use: Yes   Drug use: Yes    Types: Marijuana    Review of Systems  Review of Systems  Constitutional:  Negative for chills and fever.  HENT:  Negative for sore throat.   Eyes:  Negative for pain.  Respiratory:  Negative for cough and stridor.   Cardiovascular:  Positive for chest pain.  Gastrointestinal:  Positive for abdominal pain. Negative for vomiting.  Genitourinary:  Negative for dysuria.  Musculoskeletal:  Negative for myalgias.  Skin:  Negative for rash.  Neurological:  Negative for seizures, loss of consciousness and headaches.  Psychiatric/Behavioral:  Negative for suicidal ideas.   All other systems reviewed and are negative.    ____________________________________________   PHYSICAL EXAM:  VITAL SIGNS: ED Triage Vitals  Enc Vitals Group     BP 12/06/20 1015 (!) 155/97     Pulse Rate 12/06/20 1015 84     Resp 12/06/20 1015 17     Temp 12/06/20 1015 98 F (36.7 C)     Temp src --      SpO2 12/06/20 1015 100 %     Weight --      Height --      Head Circumference --      Peak Flow --      Pain Score 12/06/20 1013 10     Pain Loc --      Pain Edu? --      Excl. in GC? --    Vitals:   12/06/20 1015  BP: (!) 155/97  Pulse: 84  Resp: 17  Temp: 98 F (36.7 C)  SpO2: 100%   Physical Exam Vitals and nursing note reviewed.  Constitutional:      Appearance: He is well-developed.  HENT:     Head: Normocephalic and atraumatic.     Right Ear: External ear normal.     Left Ear: External ear normal.     Nose: Nose normal.  Eyes:     Conjunctiva/sclera: Conjunctivae normal.  Cardiovascular:     Rate and Rhythm: Normal rate and regular rhythm.     Heart sounds: No murmur heard. Pulmonary:     Effort: Pulmonary effort is normal. No respiratory distress.     Breath sounds: Normal breath sounds.   Abdominal:     Palpations: Abdomen is soft.     Tenderness: There is abdominal tenderness in the left lower quadrant. There is no right CVA tenderness or left CVA tenderness.  Musculoskeletal:     Cervical back: Neck supple.  Skin:    General: Skin is warm and dry.     Capillary Refill: Capillary refill takes less than 2 seconds.  Neurological:     Mental Status: He is alert and oriented to person, place, and time.  Psychiatric:        Mood and Affect: Mood normal.  No inguinal hernias palpated. ____________________________________________   LABS (all labs ordered are listed, but only abnormal results are displayed)  Labs Reviewed  COMPREHENSIVE METABOLIC PANEL - Abnormal; Notable for the following components:      Result Value   Glucose, Bld 125 (*)    Creatinine, Ser 1.36 (*)    Total Protein 6.1 (*)    All other components within normal limits  URINALYSIS, COMPLETE (UACMP) WITH MICROSCOPIC - Abnormal; Notable for the following components:   Color, Urine YELLOW (*)    APPearance CLEAR (*)    Hgb urine dipstick MODERATE (*)    Leukocytes,Ua TRACE (*)    All other components within normal limits  LIPASE, BLOOD  CBC  TROPONIN I (HIGH SENSITIVITY)  TROPONIN I (HIGH SENSITIVITY)   ____________________________________________  EKG  Sinus rhythm with a ventricular rate of 75, normal axis, unremarkable intervals without clear evidence of acute ischemia or significant arrhythmia. ____________________________________________  RADIOLOGY  ED MD interpretation: Chest x-ray has no focal consolidation, effusion, edema, pneumothorax or other clear acute thoracic process.  CT abdomen pelvis shows no evidence of diverticulitis, SBO, pyelonephritis, kidney stone or other clear acute process.  There is a 2.3 cm soft tissue density along the anterior coccyx seen.  Seen 2019 of unclear etiology.  No other acute abdominopelvic process noted.  Official radiology report(s): DG Chest 2  View  Result Date: 12/06/2020 CLINICAL DATA:  Chest and abdominal pain beginning 3 days ago. EXAM: CHEST - 2 VIEW COMPARISON:  08/25/2020 FINDINGS: The cardiomediastinal silhouette is unchanged with normal heart size. The lungs are well inflated and clear. No pleural effusion or pneumothorax is identified. Nipple shadows are noted bilaterally. No acute osseous abnormality is seen. IMPRESSION: No active cardiopulmonary disease. Electronically Signed   By: Sebastian Ache M.D.   On: 12/06/2020 12:14   CT ABDOMEN PELVIS W CONTRAST  Result Date: 12/06/2020 CLINICAL DATA:  Acute left abdominal pain for 3 days with chills and sweats. History of diverticulitis and colonic resection. EXAM: CT ABDOMEN AND PELVIS WITH CONTRAST TECHNIQUE: Multidetector CT imaging of the abdomen and pelvis was performed using the standard protocol following bolus administration of intravenous contrast. CONTRAST:  80mL OMNIPAQUE IOHEXOL 350 MG/ML SOLN COMPARISON:  11/09/2017 CT abdomen/pelvis. FINDINGS: Lower chest: No significant pulmonary nodules or acute consolidative airspace disease. Hepatobiliary: Normal liver size. No liver mass. Normal gallbladder with no radiopaque cholelithiasis. No biliary ductal dilatation. Moderate periampullary duodenal diverticulum. Pancreas: Normal, with no mass or duct dilation. Spleen: Normal size. No mass. Adrenals/Urinary Tract: Normal adrenals. Normal kidneys with no hydronephrosis and no renal mass. Normal bladder. Stomach/Bowel: Normal non-distended stomach. Normal caliber small bowel with no small bowel wall thickening. Appendectomy. Stable postsurgical changes from subtotal left colectomy with scattered mild colonic diverticulosis and no large bowel wall thickening or acute pericolonic fat stranding. Vascular/Lymphatic: Normal caliber abdominal aorta. Patent portal, splenic, hepatic and renal veins. No pathologically enlarged lymph nodes in the abdomen or pelvis. Reproductive: Normal size prostate.  Other: No pneumoperitoneum, ascites or focal fluid collection. Indeterminate 2.3 x 2.1 cm soft tissue density mass along the anterior coccyx (series 2/image 88), previously 2.1 x 2.0 cm on 11/08/2017 CT, minimally increased. Musculoskeletal: No aggressive appearing focal osseous lesions. Mild thoracolumbar spondylosis. IMPRESSION: 1. No acute abnormality. No evidence of bowel obstruction or acute bowel inflammation. Stable postsurgical changes from subtotal left colectomy. Mild colonic diverticulosis, with no evidence of acute diverticulitis. 2. Indeterminate chronic 2.3 cm soft tissue density mass along the anterior coccyx, minimally increased in  size since 11/08/2017 CT, cannot exclude slow growing neoplasm such as a teratoma or other neoplastic etiology. Surgical consultation suggested. Electronically Signed   By: Delbert Phenix M.D.   On: 12/06/2020 13:56    ____________________________________________   PROCEDURES  Procedure(s) performed (including Critical Care):  Procedures   ____________________________________________   INITIAL IMPRESSION / ASSESSMENT AND PLAN / ED COURSE     Presents with above-stated history exam for assessment of 2 complaints as described above occluding some chest pain worsened when he is moving his hands behind his torso and left lower quad abdominal pain.    With regard to the chest discomfort suspect likely costochondritis.  No findings on exam to suggest cellulitis or shingles.  Chest x-ray has no evidence of rib fracture, pneumothorax, pneumonia or other acute thoracic process.  ECG and nonelevated troponin are not suggestive of ACS or myocarditis.  Given overall description I have a low suspicion for dissection at this time.  With regard to left lower quadrant abdominal pain differential includes infectious colitis or diverticulitis, kidney stone and cystitis.  CMP shows no significant electrolyte or metabolic derangements.  No evidence of hepatitis or  cholestasis or tenderness to suggest cholecystitis.  Lipase not consistent with pancreatitis.  CBC with WBC count of 5.5, hemoglobin of 16.7 and normal platelets.  UA has moderate hemoglobin and trace leukocyte esterase but no other evidence suggestive of urinary tract infection at this time.  I have a low suspicion for pyelonephritis given absence of fever, leukocytosis or tenderness in left CVA region.  CT abdomen pelvis shows no evidence of diverticulitis, SBO, pyelonephritis, kidney stone or other clear acute process.  There is a 2.3 cm soft tissue density along the anterior coccyx seen.  Seen 2019 of unclear etiology.  No other acute abdominopelvic process noted.  Overall I think patient's left lower quadrant discomfort is related necessarily to the spot seen on the CT noted above.  I discussed this with patient recommendation for close outpatient PCP follow-up to see if he needs a surgery referral.  Unclear etiology of the left lower quadrant abdominal pain at this time although given stable vitals with otherwise reassuring CT and work-up I have a suspicion for immediate life-threatening pathology and think he is stable for discharge close outpatient follow-up.  Also advised him that he had some blood in his urine which is abnormal he should also follow this up with his PCP.  Discharged stable condition.  Return precautions advised and discussed.  Trial short course of Bentyl to see if this helps with abdominal discomfort this time. ____________________________________________   FINAL CLINICAL IMPRESSION(S) / ED DIAGNOSES  Final diagnoses:  Abdominal pain, unspecified abdominal location  Hematuria, unspecified type    Medications  morphine 4 MG/ML injection 6 mg (6 mg Intravenous Given 12/06/20 1201)  iohexol (OMNIPAQUE) 350 MG/ML injection 80 mL (80 mLs Intravenous Contrast Given 12/06/20 1316)     ED Discharge Orders          Ordered    dicyclomine (BENTYL) 10 MG capsule  4 times daily         12/06/20 1433             Note:  This document was prepared using Dragon voice recognition software and may include unintentional dictation errors.    Gilles Chiquito, MD 12/06/20 [phone]

## 2021-02-26 ENCOUNTER — Encounter: Payer: Self-pay | Admitting: Family Medicine

## 2021-03-17 ENCOUNTER — Encounter: Payer: Self-pay | Admitting: Gastroenterology

## 2021-03-17 ENCOUNTER — Other Ambulatory Visit: Payer: Self-pay

## 2021-03-17 ENCOUNTER — Ambulatory Visit (INDEPENDENT_AMBULATORY_CARE_PROVIDER_SITE_OTHER): Payer: Medicaid Other | Admitting: Gastroenterology

## 2021-03-17 VITALS — BP 137/90 | HR 91 | Temp 98.5°F | Wt 186.0 lb

## 2021-03-17 DIAGNOSIS — R9389 Abnormal findings on diagnostic imaging of other specified body structures: Secondary | ICD-10-CM

## 2021-03-17 NOTE — Patient Instructions (Signed)
We will be placing a referral to general surgery Dr Everlene Farrier.  [facility] [phone]

## 2021-03-17 NOTE — Progress Notes (Signed)
Gastroenterology Consultation  Referring Provider:     Hillery Aldo, MD Primary Care Physician:  Hillery Aldo, MD Primary Gastroenterologist:  Dr. Servando Snare     Reason for Consultation:     Abnormal imaging        HPI:   Manuel Harmon is a 51 y.o. y/o male referred for consultation & management of abnormal imaging by Dr. Hillery Aldo, MD. This patient comes in today after being seen by Dr. Allegra Lai for a colonoscopy back in 2019.  At that time the patient was found to have a colonic resection of the left side due to diverticulitis and it was recommended to have a repeat colonoscopy in 10 years.  The patient then had imaging with a CT scan that showed:  IMPRESSION: 1. No acute abnormality. No evidence of bowel obstruction or acute bowel inflammation. Stable postsurgical changes from subtotal left colectomy. Mild colonic diverticulosis, with no evidence of acute diverticulitis. 2. Indeterminate chronic 2.3 cm soft tissue density mass along the anterior coccyx, minimally increased in size since 11/08/2017 CT, cannot exclude slow growing neoplasm such as a teratoma or other neoplastic etiology. Surgical consultation suggested.  As recommended in the above impression a surgical consultation was suggested.  The patient was also seen in the ER prior to the CT scan for left lower quadrant pain which is source of was not seen on the CT scan. The patient reports that he has flank pain on the Left side that is worse when he lays on the right side and better when he lays on his left side.  He also states that it is worse when he moves around.  There is no report of any change in bowel habits.  He was under the impression that he is coming today because the CT scan showed an abnormality that he thought was in the colon.  Past Medical History:  Diagnosis Date   Anxiety and depression    Depression    Diverticulosis of colon    GERD (gastroesophageal reflux disease)    History of diverticulitis of colon     Hypertension    UTI (lower urinary tract infection)     Past Surgical History:  Procedure Laterality Date   COLONOSCOPY WITH PROPOFOL N/A 12/06/2017   Procedure: COLONOSCOPY WITH PROPOFOL;  Surgeon: Toney Reil, MD;  Location: [facility];  Service: Gastroenterology;  Laterality: N/A;   LEFT COLECTOMY AND APPENDECTOMY  05-31-2007   MOUTH SURGERY     OLECRANON BURSECTOMY Right 09/27/2018   Procedure: OLECRANON BURSECTOMY, EXOSTOSIS REMOVAL;  Surgeon: Signa Kell, MD;  Location: [facility];  Service: Orthopedics;  Laterality: Right;   RIGHT ORCHIOPEXY INGUINAL APPROACH AND HYDROCELECTOMY  1989   SCAR REVISION N/A 01/15/2014   Procedure: SCAR REVISION AND REMOVAL OF SUTURE MATERIAL FROM ABDOMINAL WALL;  Surgeon: Darnell Level, MD;  Location: [facility];  Service: General;  Laterality: N/A;   TONSILLECTOMY AND ADENOIDECTOMY N/A 09/11/2017   Procedure: TONSILLECTOMY AND POSSIBLE ADENOIDECTOMY;  Surgeon: Geanie Logan, MD;  Location: [facility];  Service: ENT;  Laterality: N/A;  NO ADENOID TISSUE PRESENT PER MD    Prior to Admission medications   Medication Sig Start Date End Date Taking? Authorizing Provider  dicyclomine (BENTYL) 10 MG capsule Take 1 capsule (10 mg total) by mouth 4 (four) times daily for 3 days. 12/06/20 12/09/20  Gilles Chiquito, MD  lisinopril (PRINIVIL,ZESTRIL) 20 MG tablet Take 20 mg by mouth every morning.  03/17/14   [provider]  famotidine (PEPCID) 20 MG tablet Take 1 tablet (20 mg total) by mouth 2 (two) times daily. Patient taking differently: Take 20 mg by mouth as needed.  09/16/18 11/13/19  Emily Filbert, MD    Family History  Problem Relation Age of Onset   Drug abuse Father    Cancer Father    Prostate cancer Paternal Grandfather    Healthy Mother    Bladder Cancer Neg Hx    Kidney cancer Neg Hx      Social History   Tobacco Use   Smoking status: Every Day    Packs/day: 1.00    Years: 33.00    Pack  years: 33.00    Types: Cigarettes   Smokeless tobacco: Never  Vaping Use   Vaping Use: Never used  Substance Use Topics   Alcohol use: Yes   Drug use: Yes    Types: Marijuana    Allergies as of 03/17/2021 - Review Complete 12/06/2020  Allergen Reaction Noted   Ibuprofen Nausea And Vomiting, Other (See Comments), and Rash 01/13/2014   Penicillins Swelling and Rash 04/18/2016   Wellbutrin [bupropion] Hives and Other (See Comments) 01/13/2014    Review of Systems:    All systems reviewed and negative except where noted in HPI.   Physical Exam:  There were no vitals taken for this visit. No LMP for male patient. General:   Alert,  Well-developed, well-nourished, pleasant and cooperative in NAD Head:  Normocephalic and atraumatic. Eyes:  Sclera clear, no icterus.   Conjunctiva pink. Ears:  Normal auditory acuity. Neck:  Supple; no masses or thyromegaly. Lungs:  Respirations even and unlabored.  Clear throughout to auscultation.   No wheezes, crackles, or rhonchi. No acute distress. Heart:  Regular rate and rhythm; no murmurs, clicks, rubs, or gallops. Abdomen:  Normal bowel sounds.  No bruits.  Soft, non-tender and non-distended without masses, hepatosplenomegaly or hernias noted.  No guarding or rebound tenderness.  Negative Carnett sign.  Positive tenderness on the Left flank Rectal:  Deferred.  Pulses:  Normal pulses noted. Extremities:  No clubbing or edema.  No cyanosis. Neurologic:  Alert and oriented x3;  grossly normal neurologically. Skin:  Intact without significant lesions or rashes.  No jaundice. Lymph Nodes:  No significant cervical adenopathy. Psych:  Alert and cooperative. Normal mood and affect.  Imaging Studies: No results found.  Assessment and Plan:   Manuel Harmon is a 51 y.o. y/o male who comes in today with left flank pain that is more in his back then on his side. The tenderness was reproducible with bending and palpation of the muscles on the back.  The  patient has been told that his pain is musculoskeletal he also reports he has a history of back pain also.  The patient was told that the CT scan showed a lesion on his coccyx and not in his colon.  He will be set up for a consultation with surgery to evaluate that lesion as recommended by radiology.  The patient has been explained the plan and agrees with it.    Midge Minium, MD. Clementeen Graham    Note: This dictation was prepared with Dragon dictation along with smaller phrase technology. Any transcriptional errors that result from this process are unintentional.

## 2021-03-23 ENCOUNTER — Encounter: Payer: Self-pay | Admitting: Emergency Medicine

## 2021-03-23 ENCOUNTER — Emergency Department
Admission: EM | Admit: 2021-03-23 | Discharge: 2021-03-23 | Disposition: A | Discharge status: Home / Self Care | Payer: Medicaid Other | Attending: Emergency Medicine | Admitting: Emergency Medicine

## 2021-03-23 ENCOUNTER — Emergency Department: Payer: Medicaid Other

## 2021-03-23 ENCOUNTER — Other Ambulatory Visit: Payer: Self-pay

## 2021-03-23 DIAGNOSIS — R509 Fever, unspecified: Secondary | ICD-10-CM | POA: Diagnosis present

## 2021-03-23 DIAGNOSIS — Z28310 Unvaccinated for covid-19: Secondary | ICD-10-CM | POA: Diagnosis not present

## 2021-03-23 DIAGNOSIS — M545 Low back pain, unspecified: Secondary | ICD-10-CM | POA: Insufficient documentation

## 2021-03-23 DIAGNOSIS — G8929 Other chronic pain: Secondary | ICD-10-CM | POA: Diagnosis not present

## 2021-03-23 DIAGNOSIS — U071 COVID-19: Secondary | ICD-10-CM | POA: Diagnosis not present

## 2021-03-23 LAB — URINALYSIS, COMPLETE (UACMP) WITH MICROSCOPIC
Bilirubin Urine: NEGATIVE
Glucose, UA: NEGATIVE mg/dL
Hgb urine dipstick: NEGATIVE
Ketones, ur: 5 mg/dL — AB
Leukocytes,Ua: NEGATIVE
Nitrite: NEGATIVE
Protein, ur: 30 mg/dL — AB
Specific Gravity, Urine: 1.028 (ref 1.005–1.030)
pH: 5 (ref 5.0–8.0)

## 2021-03-23 LAB — RESP PANEL BY RT-PCR (FLU A&B, COVID) ARPGX2
Influenza A by PCR: NEGATIVE
Influenza B by PCR: NEGATIVE
SARS Coronavirus 2 by RT PCR: POSITIVE — AB

## 2021-03-23 MED ORDER — OXYCODONE-ACETAMINOPHEN 5-325 MG PO TABS: 1.0000 | ORAL_TABLET | ORAL | 0 refills | Status: DC | PRN

## 2021-03-23 MED ORDER — HYDROCODONE-ACETAMINOPHEN 5-325 MG PO TABS
1.0000 | ORAL_TABLET | Freq: Once | ORAL | Status: AC
  Administered 2021-03-23: 1 via ORAL
  Filled 2021-03-23: qty 1

## 2021-03-23 MED ORDER — NIRMATRELVIR/RITONAVIR (PAXLOVID)TABLET: 3.0000 | ORAL_TABLET | Freq: Two times a day (BID) | ORAL | 0 refills | Status: AC

## 2021-03-23 NOTE — ED Triage Notes (Signed)
Pt comes into the ED via POV c/o cough, fever, and chills that started yesterday.  Pt states he took a COVID teste yesterday that was negative.  Pt has even and unlabored respirations.

## 2021-03-23 NOTE — Discharge Instructions (Signed)

## 2021-03-23 NOTE — ED Notes (Signed)
See triage note  presents with body aches  low grade fever and cough  states sxs' started yesterday

## 2021-03-23 NOTE — ED Provider Notes (Signed)
[facility] Provider Note    Event Date/Time   First MD Initiated Contact with Patient 03/23/21 [phone]     (approximate)   History   Fever, Generalized Body Aches, and Cough   HPI  Manuel Harmon is a 51 y.o. male with history of a questionable back lesion on the coccyx, presents with low back pain associated with this finding but also with fever, chills, body aches and general malaise.  Patient was seen at the gastroenterology office but was told he needs to see a neurosurgeon to evaluate the lesion on his spine.  He denies chest pain or shortness of breath.  Flulike symptoms started yesterday.  Had negative COVID test at home.      Physical Exam   Triage Vital Signs: ED Triage Vitals  Enc Vitals Group     BP 03/23/21 0749 (!) 155/88     Pulse Rate 03/23/21 0749 (!) 109     Resp 03/23/21 0749 18     Temp 03/23/21 0749 99.6 F (37.6 C)     Temp Source 03/23/21 0749 Oral     SpO2 03/23/21 0749 96 %     Weight 03/23/21 0746 186 lb (84.4 kg)     Height 03/23/21 0746 5\' 10"  (1.778 m)     Head Circumference --      Peak Flow --      Pain Score 03/23/21 0746 10     Pain Loc --      Pain Edu? --      Excl. in [place]? --     Most recent vital signs: Vitals:   03/23/21 0749  BP: (!) 155/88  Pulse: (!) 109  Resp: 18  Temp: 99.6 F (37.6 C)  SpO2: 96%     General: Awake, no distress.   CV:  Good peripheral perfusion.  Regular rate and rhythm, heart sounds normal Resp:  Normal effort.  Lungs CTA Abd:  No distention.   Other:  Lumbar spines very tender to palpation, patient has difficulty with movement secondary to pain   ED Results / Procedures / Treatments   Labs (all labs ordered are listed, but only abnormal results are displayed) Labs Reviewed  RESP PANEL BY RT-PCR (FLU A&B, COVID) ARPGX2 - Abnormal; Notable for the following components:      Result Value   SARS Coronavirus 2 by RT PCR POSITIVE (*)    All other components within normal  limits  URINALYSIS, COMPLETE (UACMP) WITH MICROSCOPIC - Abnormal; Notable for the following components:   Color, Urine YELLOW (*)    APPearance CLEAR (*)    Ketones, ur 5 (*)    Protein, ur 30 (*)    Bacteria, UA RARE (*)    All other components within normal limits     EKG     RADIOLOGY  Chest x-ray, lumbar spine x-ray   PROCEDURES:  Critical Care performed: No  Procedures   MEDICATIONS ORDERED IN ED: Medications  HYDROcodone-acetaminophen (NORCO/VICODIN) 5-325 MG per tablet 1 tablet (1 tablet Oral Given 03/23/21 0813)     IMPRESSION / MDM / ASSESSMENT AND PLAN / ED COURSE  I reviewed the triage vital signs and the nursing notes.                              Differential diagnosis includes, but is not limited to, influenza, COVID, abscess of the spine, neoplasm of the spine  Patient is  51 year old male presents emergency department with flulike symptoms.  Had negative COVID test at home.  Also complaining of low back pain.  As of note he is being evaluated for a soft tissue mass along the coccyx.  Pain has increased in this area recently.  Respiratory panel is positive for COVID, negative for influenza this would explain the generalized body aches and fever  Chest x-ray reviewed by me does not show any pneumonia, this is confirmed by radiology X-ray of the lumbar spine is negative, no metastatic lesion is noted in the lumbar spine  I did explain all of these findings to the patient.  Still having some pain.  Explained that she takes some Tylenol and ibuprofen S wave already given him on hydrocodone.  I do feel that most of his pain is related to the COVID and flulike symptoms.  However he is to keep his appointment with the surgeon for evaluation of the soft tissue area that needs to be biopsied.  Return emergency department if he is worsening.  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  Explained to him he is contagious as he is not vaccinated for  COVID he will need to stay out for 1 week.  Patient is in agreement treatment plan.  He was discharged stable condition.  Do not feel he needs admission as he is hemodynamically stable.       FINAL CLINICAL IMPRESSION(S) / ED DIAGNOSES   Final diagnoses:  COVID-19  Chronic back pain, unspecified back location, unspecified back pain laterality     Rx / DC Orders   ED Discharge Orders          Ordered    nirmatrelvir/ritonavir EUA (PAXLOVID) 20 x 150 MG & 10 x 100MG  TABS  2 times daily        03/23/21 0943    oxyCODONE-acetaminophen (PERCOCET) 5-325 MG tablet  Every 4 hours PRN        03/23/21 0943             Note:  This document was prepared using Dragon voice recognition software and may include unintentional dictation errors.    Versie Starks, PA-C 03/23/21 [id number]    Carrie Mew, MD 03/23/21 1321

## 2021-03-28 ENCOUNTER — Ambulatory Visit: Payer: Medicaid Other | Admitting: Surgery

## 2021-06-12 ENCOUNTER — Emergency Department: Payer: Medicaid Other

## 2021-06-12 ENCOUNTER — Other Ambulatory Visit: Payer: Self-pay

## 2021-06-12 ENCOUNTER — Encounter: Payer: Self-pay | Admitting: Intensive Care

## 2021-06-12 ENCOUNTER — Emergency Department
Admission: EM | Admit: 2021-06-12 | Discharge: 2021-06-12 | Disposition: A | Discharge status: Home / Self Care | Payer: Medicaid Other | Attending: Emergency Medicine | Admitting: Emergency Medicine

## 2021-06-12 DIAGNOSIS — K2100 Gastro-esophageal reflux disease with esophagitis, without bleeding: Secondary | ICD-10-CM | POA: Insufficient documentation

## 2021-06-12 DIAGNOSIS — Z87891 Personal history of nicotine dependence: Secondary | ICD-10-CM | POA: Diagnosis not present

## 2021-06-12 DIAGNOSIS — R0789 Other chest pain: Secondary | ICD-10-CM | POA: Diagnosis present

## 2021-06-12 DIAGNOSIS — R0781 Pleurodynia: Secondary | ICD-10-CM | POA: Diagnosis not present

## 2021-06-12 DIAGNOSIS — I10 Essential (primary) hypertension: Secondary | ICD-10-CM | POA: Diagnosis not present

## 2021-06-12 LAB — CBC
HCT: 46.5 % (ref 39.0–52.0)
Hemoglobin: 15.2 g/dL (ref 13.0–17.0)
MCH: 28.8 pg (ref 26.0–34.0)
MCHC: 32.7 g/dL (ref 30.0–36.0)
MCV: 88.2 fL (ref 80.0–100.0)
Platelets: 298 10*3/uL (ref 150–400)
RBC: 5.27 MIL/uL (ref 4.22–5.81)
RDW: 14.7 % (ref 11.5–15.5)
WBC: 6.7 10*3/uL (ref 4.0–10.5)
nRBC: 0 % (ref 0.0–0.2)

## 2021-06-12 LAB — BASIC METABOLIC PANEL
Anion gap: 8 (ref 5–15)
BUN: 13 mg/dL (ref 6–20)
CO2: 27 mmol/L (ref 22–32)
Calcium: 8.7 mg/dL — ABNORMAL LOW (ref 8.9–10.3)
Chloride: 106 mmol/L (ref 98–111)
Creatinine, Ser: 1.19 mg/dL (ref 0.61–1.24)
GFR, Estimated: >60 mL/min (ref >60)
Glucose, Bld: 93 mg/dL (ref 70–99)
Potassium: 4.3 mmol/L (ref 3.5–5.1)
Sodium: 141 mmol/L (ref 135–145)

## 2021-06-12 LAB — TROPONIN I (HIGH SENSITIVITY)
Troponin I (High Sensitivity): 6 ng/L (ref <18)
Troponin I (High Sensitivity): 5 ng/L (ref <18)

## 2021-06-12 LAB — LIPASE, BLOOD: Lipase: 32 U/L (ref 11–51)

## 2021-06-12 MED ORDER — FAMOTIDINE 40 MG PO TABS: 40.0000 mg | ORAL_TABLET | Freq: Every evening | ORAL | 0 refills | Status: AC

## 2021-06-12 MED ORDER — SUCRALFATE 1 G PO TABS: 1.0000 g | ORAL_TABLET | Freq: Four times a day (QID) | ORAL | 0 refills | Status: DC

## 2021-06-12 MED ORDER — LIDOCAINE VISCOUS HCL 2 % MT SOLN
15.0000 mL | Freq: Once | OROMUCOSAL | Status: AC
  Administered 2021-06-12: 15 mL via ORAL
  Filled 2021-06-12: qty 15

## 2021-06-12 MED ORDER — IOHEXOL 350 MG/ML SOLN
100.0000 mL | Freq: Once | INTRAVENOUS | Status: AC | PRN
  Administered 2021-06-12: 100 mL via INTRAVENOUS
  Filled 2021-06-12: qty 100

## 2021-06-12 MED ORDER — SUCRALFATE 1 G PO TABS: 1.0000 g | ORAL_TABLET | Freq: Four times a day (QID) | ORAL | 0 refills | Status: AC

## 2021-06-12 MED ORDER — FAMOTIDINE 40 MG PO TABS: 40.0000 mg | ORAL_TABLET | Freq: Every evening | ORAL | 0 refills | Status: DC

## 2021-06-12 MED ORDER — ALUM & MAG HYDROXIDE-SIMETH 200-200-20 MG/5ML PO SUSP
30.0000 mL | Freq: Once | ORAL | Status: AC
  Administered 2021-06-12: 30 mL via ORAL
  Filled 2021-06-12: qty 30

## 2021-06-12 NOTE — ED Provider Notes (Signed)
? ?[facility] ?Provider Note ? ? Event Date/Time  ? First MD Initiated Contact with Patient 06/12/21 1102   ?  (approximate) ?History  ?Chest Pain ? ?HPI ?KINLEY YARNELL is a 51 y.o. male with a past medical history of hypertension, significant tobacco and alcohol abuse who presents for bilateral upper abdominal and bilateral lower chest wall pain patient states is worse with any movement and woke him up from sleep last night at 4 AM.  Patient denies any nausea/vomiting but does endorse significant worsening of this pain with any movements or taking a deep breath.  Patient denies any other symptoms of shortness of breath.  Exertion does not worsen this pain.  Patient denies pain being this bad in the past but does endorse similar symptoms when he has had a night of heavy drinking. ?Physical Exam  ?Triage Vital Signs: ?ED Triage Vitals  ?Enc Vitals Group  ?   BP 06/12/21 1059 (!) 152/96  ?   Pulse Rate 06/12/21 1059 82  ?   Resp 06/12/21 1059 18  ?   Temp 06/12/21 1059 98.3 ?F (36.8 ?C)  ?   Temp Source 06/12/21 1059 Oral  ?   SpO2 06/12/21 1059 96 %  ?   Weight 06/12/21 1056 185 lb (83.9 kg)  ?   Height 06/12/21 1056 5\' 10"  (1.778 m)  ?   Head Circumference --   ?   Peak Flow --   ?   Pain Score 06/12/21 1056 10  ?   Pain Loc --   ?   Pain Edu? --   ?   Excl. in [place]? --   ? ?Most recent vital signs: ?Vitals:  ? 06/12/21 1059  ?BP: (!) 152/96  ?Pulse: 82  ?Resp: 18  ?Temp: 98.3 ?F (36.8 ?C)  ?SpO2: 96%  ? ?General: Awake, oriented x4. ?CV:  Good peripheral perfusion.  ?Resp:  Normal effort.  Clear to auscultation bilaterally ?Abd:  No distention.  ?Other:  Middle-aged African-American male laying in bed in mild distress secondary to abdominal/chest pain ?ED Results / Procedures / Treatments  ?Labs ?(all labs ordered are listed, but only abnormal results are displayed) ?Labs Reviewed  ?BASIC METABOLIC PANEL - Abnormal; Notable for the following components:  ?    Result Value  ? Calcium 8.7 (*)   ?  All other components within normal limits  ?CBC  ?LIPASE, BLOOD  ?TROPONIN I (HIGH SENSITIVITY)  ?TROPONIN I (HIGH SENSITIVITY)  ? ?EKG ?ED ECG REPORT ?I, Naaman Plummer, the attending physician, personally viewed and interpreted this ECG. ?Date: 06/12/2021 ?EKG Time: 1058 ?Rate: 83 ?Rhythm: normal sinus rhythm ?QRS Axis: normal ?Intervals: normal ?ST/T Wave abnormalities: normal ?Narrative Interpretation: no evidence of acute ischemia ?RADIOLOGY ?ED MD interpretation: 2 view chest x-ray interpreted by me shows no evidence of acute abnormalities including no pneumonia, pneumothorax, or widened mediastinum ?-Agree with radiology assessment ?Official radiology report(s): ?DG Chest 2 View ? ?Result Date: 06/12/2021 ?CLINICAL DATA:  Chest tightness. EXAM: CHEST - 2 VIEW COMPARISON:  March 23, 2021 FINDINGS: The heart size and mediastinal contours are within normal limits. Both lungs are clear. The visualized skeletal structures are unremarkable. IMPRESSION: No active cardiopulmonary disease. Electronically Signed   By: Dorise Bullion III M.D.   On: 06/12/2021 11:46  ? ?CT Angio Chest PE W/Cm &/Or Wo Cm ? ?Result Date: 06/12/2021 ?CLINICAL DATA:  High probability for pulmonary embolus. Chest tightness since this morning. EXAM: CT ANGIOGRAPHY CHEST WITH CONTRAST TECHNIQUE: Multidetector  CT imaging of the chest was performed using the standard protocol during bolus administration of intravenous contrast. Multiplanar CT image reconstructions and MIPs were obtained to evaluate the vascular anatomy. RADIATION DOSE REDUCTION: This exam was performed according to the departmental dose-optimization program which includes automated exposure control, adjustment of the mA and/or kV according to patient size and/or use of iterative reconstruction technique. CONTRAST:  OMNIPAQUE IOHEXOL 350 MG/ML SOLN COMPARISON:  Chest x-ray June 12, 2021. FINDINGS: Cardiovascular: Satisfactory opacification of the pulmonary arteries to the  segmental level. No evidence of pulmonary embolism. Normal heart size. No pericardial effusion. Mediastinum/Nodes: No enlarged mediastinal, hilar, or axillary lymph nodes. Thyroid gland, trachea, and esophagus demonstrate no significant findings. Lungs/Pleura: Central airways are normal. No pneumothorax. Paraseptal emphysematous changes are seen in the apices, right greater than left. No pulmonary nodules, masses, or infiltrates. Upper Abdomen: No acute abnormality. Musculoskeletal: No chest wall abnormality. No acute or significant osseous findings. Review of the MIP images confirms the above findings. IMPRESSION: 1. No pulmonary emboli.  No cause for acute symptoms identified. 2. Paraseptal emphysematous changes in the apices, right greater than left. Electronically Signed   By: Gerome Sam III M.D.   On: 06/12/2021 13:26   ?PROCEDURES: ?Critical Care performed: No ?.1-3 Lead EKG Interpretation ?Performed by: Merwyn Katos, MD ?Authorized by: Merwyn Katos, MD  ? ?  Interpretation: normal   ?  ECG rate:  81 ?  ECG rate assessment: normal   ?  Rhythm: sinus rhythm   ?  Ectopy: none   ?  Conduction: normal   ?MEDICATIONS ORDERED IN ED: ?Medications  ?alum & mag hydroxide-simeth (MAALOX/MYLANTA) 200-200-20 MG/5ML suspension 30 mL (30 mLs Oral Given 06/12/21 1241)  ?  And  ?lidocaine (XYLOCAINE) 2 % viscous mouth solution 15 mL (15 mLs Oral Given 06/12/21 1241)  ?iohexol (OMNIPAQUE) 350 MG/ML injection 100 mL (100 mLs Intravenous Contrast Given 06/12/21 1310)  ? ?IMPRESSION / MDM / ASSESSMENT AND PLAN / ED COURSE  ?I reviewed the triage vital signs and the nursing notes. ?             ?               ?The patient is on the cardiac monitor to evaluate for evidence of arrhythmia and/or significant heart rate changes. ?Workup: ECG, CXR, CBC, BMP, Troponin ?Findings: ?ECG: No overt evidence of STEMI. No evidence of Brugada?s sign, delta wave, epsilon wave, significantly prolonged QTc, or malignant arrhythmia ?HS  Troponin: Negative x2 ?Other Labs unremarkable for emergent problems. ?CXR: Without PTX, PNA, or widened mediastinum ?Last Stress Test: Denies ?Last Heart Catheterization: Denies ?HEART Score: 4 ? ?Given History, Exam, and Workup I have low suspicion for ACS, Pneumothorax, Pneumonia, Pulmonary Embolus, Tamponade, Aortic Dissection or other emergent problem as a cause for this presentation.  ? ?Reassesment: Prior to discharge patient?s pain was controlled and they were well appearing. ? ?Disposition:  Discharge. Strict return precautions discussed with patient with full understanding. Advised patient to follow up promptly with primary care provider ? ? ? ?  ?FINAL CLINICAL IMPRESSION(S) / ED DIAGNOSES  ? ?Final diagnoses:  ?Pleuritic chest pain  ?Gastroesophageal reflux disease with esophagitis without hemorrhage  ? ?Rx / DC Orders  ? ?ED Discharge Orders   ? ?      Ordered  ?  famotidine (PEPCID) 40 MG tablet  Every evening       ? 06/12/21 1401  ?  sucralfate (CARAFATE) 1 g tablet  4 times daily       ? 06/12/21 1401  ? ?  ?  ? ?  ? ?Note:  This document was prepared using Dragon voice recognition software and may include unintentional dictation errors. ?  ?Naaman Plummer, MD ?06/12/21 1404 ? ?

## 2021-06-12 NOTE — ED Notes (Signed)
See triage note  presents with chest pain since last pm  pt is tearful on arrival   states pain is sharp  increases with movement and breathing ?

## 2021-06-12 NOTE — ED Triage Notes (Addendum)
Patient c/o chest tightness since this AM. Patient slouched over in triage, holding chest c/o sharp pains ?

## 2021-06-20 ENCOUNTER — Ambulatory Visit: Payer: Medicaid Other | Admitting: Surgery

## 2021-09-04 IMAGING — DX DG CHEST 1V PORT
1 series · 1 of 1 positions shown · non-contrast
Comparison: 08/11/2016 orbital a

CLINICAL DATA: Cough, fever

EXAM:
PORTABLE CHEST 1 VIEW

[chest ap]
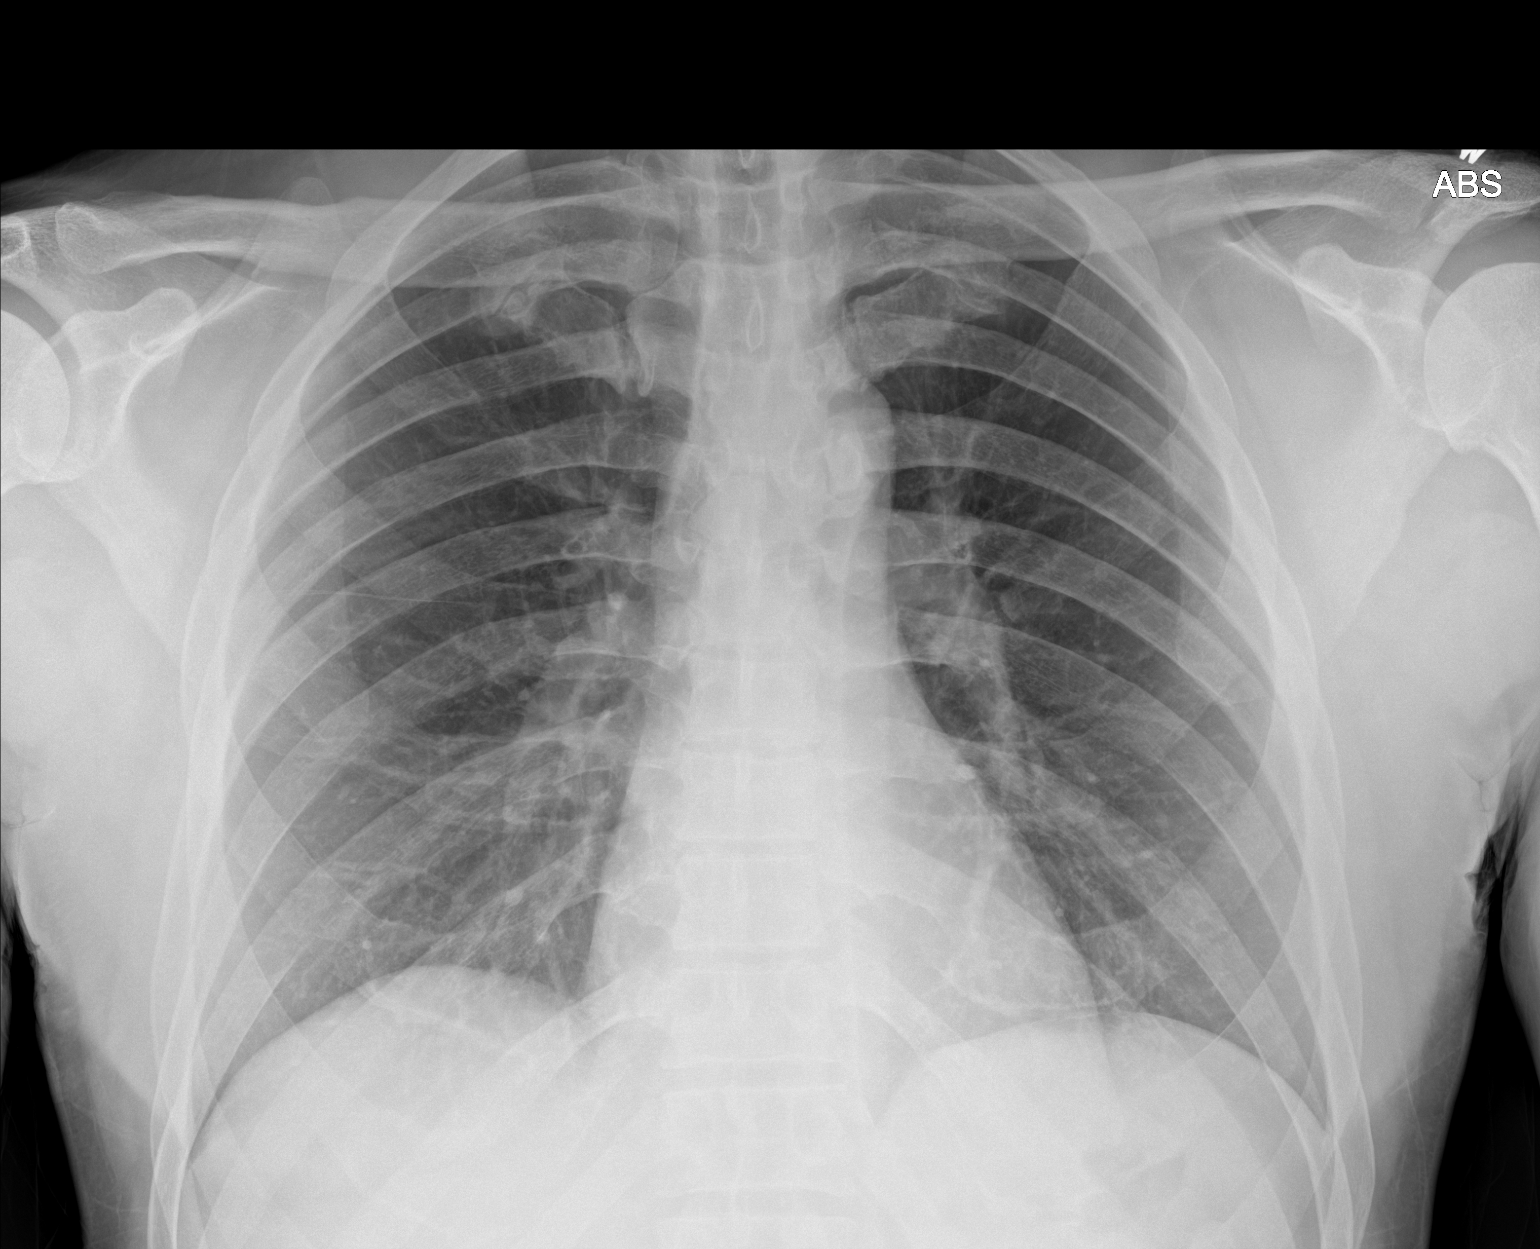

[1 of 1 positions shown; findings below may reference images not displayed]

FINDINGS: The heart size and mediastinal contours are within normal limits.
Both lungs are clear. The visualized skeletal structures are
unremarkable.
IMPRESSION: No active disease.

## 2021-09-13 ENCOUNTER — Emergency Department
Admission: EM | Admit: 2021-09-13 | Discharge: 2021-09-13 | Disposition: A | Discharge status: Home / Self Care | Payer: Medicaid Other | Attending: Emergency Medicine | Admitting: Emergency Medicine

## 2021-09-13 ENCOUNTER — Other Ambulatory Visit: Payer: Self-pay

## 2021-09-13 ENCOUNTER — Emergency Department: Payer: Medicaid Other

## 2021-09-13 DIAGNOSIS — S61212A Laceration without foreign body of right middle finger without damage to nail, initial encounter: Secondary | ICD-10-CM

## 2021-09-13 DIAGNOSIS — I10 Essential (primary) hypertension: Secondary | ICD-10-CM | POA: Diagnosis not present

## 2021-09-13 DIAGNOSIS — Z720 Tobacco use: Secondary | ICD-10-CM | POA: Diagnosis not present

## 2021-09-13 DIAGNOSIS — S52225A Nondisplaced transverse fracture of shaft of left ulna, initial encounter for closed fracture: Secondary | ICD-10-CM

## 2021-09-13 DIAGNOSIS — Z23 Encounter for immunization: Secondary | ICD-10-CM | POA: Diagnosis not present

## 2021-09-13 DIAGNOSIS — S51832A Puncture wound without foreign body of left forearm, initial encounter: Secondary | ICD-10-CM | POA: Diagnosis not present

## 2021-09-13 DIAGNOSIS — S52692A Other fracture of lower end of left ulna, initial encounter for closed fracture: Secondary | ICD-10-CM | POA: Diagnosis not present

## 2021-09-13 DIAGNOSIS — S59912A Unspecified injury of left forearm, initial encounter: Secondary | ICD-10-CM | POA: Diagnosis present

## 2021-09-13 MED ORDER — ONDANSETRON HCL 4 MG/2ML IJ SOLN
4.0000 mg | Freq: Once | INTRAMUSCULAR | Status: AC
  Administered 2021-09-13: 4 mg via INTRAVENOUS
  Filled 2021-09-13: qty 2

## 2021-09-13 MED ORDER — OXYCODONE HCL 5 MG PO TABS
5.0000 mg | ORAL_TABLET | Freq: Three times a day (TID) | ORAL | 0 refills | Status: DC | PRN
Start: 2021-09-13 — End: 2021-09-17

## 2021-09-13 MED ORDER — OXYCODONE-ACETAMINOPHEN 5-325 MG PO TABS
1.0000 | ORAL_TABLET | Freq: Once | ORAL | Status: AC
  Administered 2021-09-13: 1 via ORAL
  Filled 2021-09-13: qty 1

## 2021-09-13 MED ORDER — MORPHINE SULFATE (PF) 4 MG/ML IV SOLN
4.0000 mg | Freq: Once | INTRAVENOUS | Status: AC
  Administered 2021-09-13: 4 mg via INTRAVENOUS
  Filled 2021-09-13: qty 1

## 2021-09-13 MED ORDER — CEFAZOLIN SODIUM-DEXTROSE 2-4 GM/100ML-% IV SOLN
2.0000 g | Freq: Once | INTRAVENOUS | Status: AC
  Administered 2021-09-13: 2 g via INTRAVENOUS
  Filled 2021-09-13: qty 100

## 2021-09-13 MED ORDER — SODIUM CHLORIDE 0.9 % IV SOLN
2.0000 g | Freq: Once | INTRAVENOUS | Status: DC
  Filled 2021-09-13: qty 20

## 2021-09-13 MED ORDER — TETANUS-DIPHTH-ACELL PERTUSSIS 5-2.5-18.5 LF-MCG/0.5 IM SUSY
0.5000 mL | PREFILLED_SYRINGE | Freq: Once | INTRAMUSCULAR | Status: AC
  Administered 2021-09-13: 0.5 mL via INTRAMUSCULAR
  Filled 2021-09-13: qty 0.5

## 2021-09-13 MED ORDER — LIDOCAINE HCL (PF) 1 % IJ SOLN
30.0000 mL | Freq: Once | INTRAMUSCULAR | Status: AC
  Administered 2021-09-13: 30 mL
  Filled 2021-09-13: qty 30

## 2021-09-13 MED ORDER — CEPHALEXIN 500 MG PO CAPS: 500.0000 mg | ORAL_CAPSULE | Freq: Four times a day (QID) | ORAL | 0 refills | Status: AC

## 2021-09-13 NOTE — ED Notes (Signed)
Splint applied by MD Mchugh at this time
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
## 2021-09-13 NOTE — ED Notes (Signed)
Pt DC to home. DC instructions reviewed with all questions answered. Pt verbalizes understanding. IV removed, cath intact. Pressure dressing applied. No bleeding noted at site. Pt out of dept via wheelchair with family member.

## 2021-09-13 NOTE — Discharge Instructions (Signed)
Please take the antibiotic 4 times a day for the next 7 days.  Please follow-up with orthopedics.    If you notice any redness or pus drainage from the finger please return to the emergency department.  If you have increasing pain or any numbness in your hand on the left please return to the emergency department.
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
## 2021-09-17 ENCOUNTER — Emergency Department: Payer: Medicaid Other

## 2021-09-17 ENCOUNTER — Emergency Department
Admission: EM | Admit: 2021-09-17 | Discharge: 2021-09-17 | Disposition: A | Discharge status: Home / Self Care | Payer: Medicaid Other | Attending: Student in an Organized Health Care Education/Training Program | Admitting: Student in an Organized Health Care Education/Training Program

## 2021-09-17 ENCOUNTER — Other Ambulatory Visit: Payer: Self-pay

## 2021-09-17 DIAGNOSIS — I10 Essential (primary) hypertension: Secondary | ICD-10-CM | POA: Diagnosis not present

## 2021-09-17 DIAGNOSIS — M7989 Other specified soft tissue disorders: Secondary | ICD-10-CM | POA: Diagnosis not present

## 2021-09-17 MED ORDER — HYDROCODONE-ACETAMINOPHEN 5-325 MG PO TABS: 1.0000 | ORAL_TABLET | Freq: Four times a day (QID) | ORAL | 0 refills | Status: AC | PRN

## 2021-09-17 NOTE — ED Notes (Signed)
Dc ppw provided to patient. followup and RX information reviewed. Pt provides verbal consent for dc at this time and declines vs at time of dc. Pt ambulatory to lobby alert and oriented. 
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
## 2021-09-17 NOTE — ED Notes (Signed)
Pt left hand and forearm swollen, see triage note.

## 2021-09-17 NOTE — ED Notes (Signed)
New sugar-tong splint placed on L arm, pt educated about elevating arm to alleviate swelling.
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
## 2021-09-17 NOTE — Discharge Instructions (Addendum)
-  Keep the splint on until your follow-up orthopedic appointment.  Keep your arm elevated when possible.  Avoid any exertional activities  -Take Tylenol/ibuprofen as needed for pain.  Utilize hydrocodone sparingly.  -Return to the emergency department anytime if you begin to experience any new or worsening symptoms.

## 2021-09-17 NOTE — ED Notes (Signed)
Pt's left arm elevated.

## 2022-01-11 ENCOUNTER — Encounter: Payer: Self-pay | Admitting: Medical Oncology

## 2022-01-11 ENCOUNTER — Emergency Department
Admission: EM | Admit: 2022-01-11 | Discharge: 2022-01-11 | Disposition: A | Discharge status: Home / Self Care | Payer: Medicaid Other | Attending: Emergency Medicine | Admitting: Emergency Medicine

## 2022-01-11 DIAGNOSIS — I10 Essential (primary) hypertension: Secondary | ICD-10-CM | POA: Insufficient documentation

## 2022-01-11 DIAGNOSIS — M545 Low back pain, unspecified: Secondary | ICD-10-CM | POA: Diagnosis present

## 2022-01-11 DIAGNOSIS — M5442 Lumbago with sciatica, left side: Secondary | ICD-10-CM | POA: Diagnosis not present

## 2022-01-11 MED ORDER — GABAPENTIN 100 MG PO CAPS: 100.0000 mg | ORAL_CAPSULE | Freq: Three times a day (TID) | ORAL | 0 refills | Status: AC

## 2022-01-11 MED ORDER — CYCLOBENZAPRINE HCL 10 MG PO TABS: 10.0000 mg | ORAL_TABLET | Freq: Three times a day (TID) | ORAL | 0 refills | Status: AC | PRN

## 2022-01-11 MED ORDER — LIDOCAINE 5 % EX PTCH: 1.0000 | MEDICATED_PATCH | Freq: Two times a day (BID) | CUTANEOUS | 0 refills | Status: AC

## 2022-01-11 NOTE — Discharge Instructions (Addendum)
-  Please treat your pain with Tylenol as needed.  In addition take the cyclobenzaprine for muscle relaxation, as well as gabapentin for the pain sensation down your leg.  However, use caution with these medications as it may make you dizzy/drowsy and increase your risk of falls.  Do not operate any heavy machinery while you are taking these medications.  -Please review the education material provided.  Highly recommend the sciatica rehab exercises.  -Follow-up with the orthopedist listed in these instructions if your symptoms fail to improve after 4 weeks.  -Return to the emergency department anytime if you begin to experience any new or worsening symptoms.

## 2022-01-11 NOTE — ED Provider Notes (Signed)
[facility] Provider Note    Event Date/Time   First MD Initiated Contact with Patient 01/11/22 1123     (approximate)   History   Chief Complaint Back Pain   HPI Manuel Harmon is a 51 y.o. male, history of MDD, anxiety, PTSD, cocaine use, cannabis use, hypertension, presents emergency department for evaluation of back pain.  Patient states that he has been having lower left back pain with radiation into his buttocks and left lower extremity for the past 2 weeks.  Denies any recent injuries or illnesses.  He states that it hurts to lift his leg or ambulate.  Denies fever/chills, bowel/bladder dysfunction, saddle anesthesia, chest pain, shortness of breath, myalgias, abdominal pain, flank pain, nausea/vomiting, weakness, rash/lesions, or dizziness/lightheadedness.  History Limitations: No limitations.        Physical Exam  Triage Vital Signs: ED Triage Vitals  Enc Vitals Group     BP 01/11/22 1111 (!) 171/100     Pulse Rate 01/11/22 1111 81     Resp 01/11/22 1111 16     Temp 01/11/22 1111 98.6 F (37 C)     Temp Source 01/11/22 1111 Oral     SpO2 01/11/22 1111 96 %     Weight 01/11/22 1112 203 lb (92.1 kg)     Height 01/11/22 1112 5\' 10"  (1.778 m)     Head Circumference --      Peak Flow --      Pain Score 01/11/22 1112 10     Pain Loc --      Pain Edu? --      Excl. in [place]? --     Most recent vital signs: Vitals:   01/11/22 1111  BP: (!) 171/100  Pulse: 81  Resp: 16  Temp: 98.6 F (37 C)  SpO2: 96%    General: Awake, NAD.  Skin: Warm, dry. No rashes or lesions.  Eyes: PERRL. Conjunctivae normal.  CV: Good peripheral perfusion.  Resp: Normal effort.  Abd: Soft, non-tender. No distention.  Neuro: At baseline. No gross neurological deficits.  Musculoskeletal: Normal ROM of all extremities.  Focused Exam: No midline spinal tenderness.  Normal range of motion of all extremities.  Pain elicited with straight leg test in the left lower  extremity.  PMS intact distally.  He is still able to ambulate on his own without assistance.  Physical Exam    ED Results / Procedures / Treatments  Labs (all labs ordered are listed, but only abnormal results are displayed) Labs Reviewed - No data to display   EKG N/A.    RADIOLOGY  ED Provider Interpretation: N/A.  No results found.  PROCEDURES:  Critical Care performed: N/A.  Procedures    MEDICATIONS ORDERED IN ED: Medications - No data to display   IMPRESSION / MDM / [place] / ED COURSE  I reviewed the triage vital signs and the nursing notes.                              Differential diagnosis includes, but is not limited to, lumbar radiculopathy, sciatica, lumbosacral strain.   Assessment/Plan Presentation consistent with sciatica.  Previous x-ray shows mild degenerative changes in L5/S1 which may be contributing somewhat to his symptoms.  He does not have any red flag symptoms at this time to suggest cauda equina or epidural abscess.  He has not had any recent falls or injuries.  Imaging not  indicated at this time.  Patient has a documented allergy to NSAIDs.  We will provide him with a prescription for lidocaine patches, cyclobenzaprine, and gabapentin.  We will provide him with a referral to orthopedics if his symptoms fail to improve after 4 weeks.  Provided him with sciatica rehab exercises to trial as well.  He was amenable to this plan.  Will discharge.  Provided the patient with anticipatory guidance, return precautions, and educational material. Encouraged the patient to return to the emergency department at any time if they begin to experience any new or worsening symptoms. Patient expressed understanding and agreed with the plan.   Patient's presentation is most consistent with acute, uncomplicated illness.       FINAL CLINICAL IMPRESSION(S) / ED DIAGNOSES   Final diagnoses:  Acute left-sided low back pain with left-sided sciatica      Rx / DC Orders   ED Discharge Orders          Ordered    cyclobenzaprine (FLEXERIL) 10 MG tablet  3 times daily PRN        01/11/22 1150    gabapentin (NEURONTIN) 100 MG capsule  3 times daily        01/11/22 1150    lidocaine (LIDODERM) 5 %  Every 12 hours        01/11/22 1150             Note:  This document was prepared using Dragon voice recognition software and may include unintentional dictation errors.   Teodoro Spray, [place] 01/11/22 1154    Arta Silence, MD 01/11/22 1527

## 2022-01-11 NOTE — ED Triage Notes (Signed)
Pt reports left lower back pain that radiates into buttock x 2 weeks.

## 2023-02-15 ENCOUNTER — Other Ambulatory Visit: Payer: Self-pay

## 2023-02-15 ENCOUNTER — Emergency Department
Admission: EM | Admit: 2023-02-15 | Discharge: 2023-02-15 | Disposition: A | Discharge status: Home / Self Care | Payer: MEDICAID | Attending: Emergency Medicine | Admitting: Emergency Medicine

## 2023-02-15 ENCOUNTER — Emergency Department: Payer: MEDICAID

## 2023-02-15 DIAGNOSIS — F17200 Nicotine dependence, unspecified, uncomplicated: Secondary | ICD-10-CM | POA: Insufficient documentation

## 2023-02-15 DIAGNOSIS — I10 Essential (primary) hypertension: Secondary | ICD-10-CM | POA: Diagnosis not present

## 2023-02-15 DIAGNOSIS — R197 Diarrhea, unspecified: Secondary | ICD-10-CM | POA: Insufficient documentation

## 2023-02-15 DIAGNOSIS — R109 Unspecified abdominal pain: Secondary | ICD-10-CM | POA: Diagnosis present

## 2023-02-15 DIAGNOSIS — R110 Nausea: Secondary | ICD-10-CM | POA: Diagnosis not present

## 2023-02-15 LAB — URINALYSIS, ROUTINE W REFLEX MICROSCOPIC
Bilirubin Urine: NEGATIVE
Glucose, UA: NEGATIVE mg/dL
Hgb urine dipstick: NEGATIVE
Ketones, ur: 5 mg/dL — AB
Leukocytes,Ua: NEGATIVE
Nitrite: NEGATIVE
Protein, ur: NEGATIVE mg/dL
Specific Gravity, Urine: 1.045 — ABNORMAL HIGH (ref 1.005–1.030)
pH: 5 (ref 5.0–8.0)

## 2023-02-15 LAB — COMPREHENSIVE METABOLIC PANEL
ALT: 16 U/L (ref 0–44)
AST: 18 U/L (ref 15–41)
Albumin: 4.5 g/dL (ref 3.5–5.0)
Alkaline Phosphatase: 73 U/L (ref 38–126)
Anion gap: 12 (ref 5–15)
BUN: 18 mg/dL (ref 6–20)
CO2: 21 mmol/L — ABNORMAL LOW (ref 22–32)
Calcium: 9.6 mg/dL (ref 8.9–10.3)
Chloride: 104 mmol/L (ref 98–111)
Creatinine, Ser: 1.62 mg/dL — ABNORMAL HIGH (ref 0.61–1.24)
GFR, Estimated: 51 mL/min — ABNORMAL LOW (ref >60)
Glucose, Bld: 132 mg/dL — ABNORMAL HIGH (ref 70–99)
Potassium: 4.2 mmol/L (ref 3.5–5.1)
Sodium: 137 mmol/L (ref 135–145)
Total Bilirubin: 0.9 mg/dL (ref <1.2)
Total Protein: 8 g/dL (ref 6.5–8.1)

## 2023-02-15 LAB — CBC
HCT: 54.8 % — ABNORMAL HIGH (ref 39.0–52.0)
Hemoglobin: 18 g/dL — ABNORMAL HIGH (ref 13.0–17.0)
MCH: 28.8 pg (ref 26.0–34.0)
MCHC: 32.8 g/dL (ref 30.0–36.0)
MCV: 87.7 fL (ref 80.0–100.0)
Platelets: 393 10*3/uL (ref 150–400)
RBC: 6.25 MIL/uL — ABNORMAL HIGH (ref 4.22–5.81)
RDW: 15 % (ref 11.5–15.5)
WBC: 6.2 10*3/uL (ref 4.0–10.5)
nRBC: 0 % (ref 0.0–0.2)

## 2023-02-15 LAB — LIPASE, BLOOD: Lipase: 31 U/L (ref 11–51)

## 2023-02-15 MED ORDER — SODIUM CHLORIDE 0.9 % IV BOLUS
500.0000 mL | Freq: Once | INTRAVENOUS | Status: AC
  Administered 2023-02-15: 500 mL via INTRAVENOUS

## 2023-02-15 MED ORDER — MORPHINE SULFATE (PF) 4 MG/ML IV SOLN
4.0000 mg | Freq: Once | INTRAVENOUS | Status: AC
  Administered 2023-02-15: 4 mg via INTRAVENOUS
  Filled 2023-02-15: qty 1

## 2023-02-15 MED ORDER — IOHEXOL 300 MG/ML  SOLN
100.0000 mL | Freq: Once | INTRAMUSCULAR | Status: AC | PRN
  Administered 2023-02-15: 100 mL via INTRAVENOUS

## 2023-02-15 MED ORDER — HYDROMORPHONE HCL 1 MG/ML IJ SOLN
0.5000 mg | Freq: Once | INTRAMUSCULAR | Status: AC
  Administered 2023-02-15: 0.5 mg via INTRAVENOUS
  Filled 2023-02-15: qty 0.5

## 2023-02-15 MED ORDER — IOHEXOL 350 MG/ML SOLN
100.0000 mL | Freq: Once | INTRAVENOUS | Status: DC | PRN
Start: 2023-02-15 — End: 2023-02-15

## 2023-02-15 MED ORDER — CETIRIZINE HCL 10 MG PO TABS: 10.0000 mg | ORAL_TABLET | Freq: Every day | ORAL | 2 refills | Status: AC
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
## 2023-02-15 NOTE — ED Provider Notes (Signed)
[facility] Provider Note    Event Date/Time   First MD Initiated Contact with Patient 02/15/23 1128     (approximate)   History   Flank Pain   HPI  Manuel Harmon is a 52 y.o. male past medical history significant for hypertension, diverticulitis s/p partial colectomy, prior appendectomy, tobacco use, who presents to the emergency department with abdominal pain.  Endorses 2 days of left-sided abdominal pain in his lower abdomen that radiates to his left flank.  Nothing improves or worsens the pain.  Associated with some mild nausea and ongoing diarrhea.  Denies any blood in his stool.  Denies fever or chills.  States that it is worse with movement.  No heavy lifting or straining.  Endorses ongoing urinary urgency and frequency.  Dry mucous membranes.  States that he has been very thirsty.  No history of urinary tract infection or kidney stone.  Prior partial colectomy secondary to a complicated diverticulitis.  Denies significant shortness of breath.  Denies any history of DVT or PE.  Denies recent travel or surgery.     Physical Exam   Triage Vital Signs: ED Triage Vitals  Encounter Vitals Group     BP 02/15/23 1014 (!) 151/112     Systolic BP Percentile --      Diastolic BP Percentile --      Pulse Rate 02/15/23 1014 100     Resp 02/15/23 1014 20     Temp 02/15/23 1014 98 F (36.7 C)     Temp Source 02/15/23 1014 Oral     SpO2 02/15/23 1014 98 %     Weight 02/15/23 1141 203 lb 0.7 oz (92.1 kg)     Height 02/15/23 1141 5\' 10"  (1.778 m)     Head Circumference --      Peak Flow --      Pain Score 02/15/23 1015 10     Pain Loc --      Pain Education --      Exclude from Growth Chart --     Most recent vital signs: Vitals:   02/15/23 1014 02/15/23 1448  BP: (!) 151/112 (!) 157/98  Pulse: 100 78  Resp: 20 18  Temp: 98 F (36.7 C) 98.1 F (36.7 C)  SpO2: 98% 98%    Physical Exam Constitutional:      Appearance: He is well-developed.  HENT:      Head: Atraumatic.  Eyes:     Conjunctiva/sclera: Conjunctivae normal.  Cardiovascular:     Rate and Rhythm: Regular rhythm.  Pulmonary:     Effort: No respiratory distress.  Abdominal:     Tenderness: There is abdominal tenderness.     Comments: Abdominal scar.  Left lower quadrant abdominal tenderness to palpation and left CVA tenderness.  Musculoskeletal:        General: Normal range of motion.     Cervical back: Normal range of motion.     Right lower leg: No edema.     Left lower leg: No edema.  Skin:    General: Skin is warm.     Capillary Refill: Capillary refill takes less than 2 seconds.  Neurological:     Mental Status: He is alert. Mental status is at baseline.     IMPRESSION / MDM / ASSESSMENT AND PLAN / ED COURSE  I reviewed the triage vital signs and the nursing notes.  Differential diagnosis including diverticulitis, intra-abdominal abscess, pyelonephritis, kidney stone, small bowel obstruction, pancreatitis.  Low  risk Wells criteria, otherwise PERC negative other than age of 35.  Clinical picture is not consistent with pulmonary embolism.  EKG  I, Corena Herter, the attending physician, personally viewed and interpreted this ECG.   Rate: 103  Rhythm: Sinus tachycardia  Axis: Normal  Intervals: Normal  ST&T Change: None No significant change when compared to prior EKG    RADIOLOGY I independently reviewed imaging, my interpretation of imaging: CT abdomen and pelvis with contrast no acute abnormalities.  Read as no acute findings.  LABS (all labs ordered are listed, but only abnormal results are displayed) Labs interpreted as -    Labs Reviewed  COMPREHENSIVE METABOLIC PANEL - Abnormal; Notable for the following components:      Result Value   CO2 21 (*)    Glucose, Bld 132 (*)    Creatinine, Ser 1.62 (*)    GFR, Estimated 51 (*)    All other components within normal limits  CBC - Abnormal; Notable for the following components:   RBC 6.25  (*)    Hemoglobin 18.0 (*)    HCT 54.8 (*)    All other components within normal limits  URINALYSIS, ROUTINE W REFLEX MICROSCOPIC - Abnormal; Notable for the following components:   Color, Urine YELLOW (*)    APPearance CLEAR (*)    Specific Gravity, Urine 1.045 (*)    Ketones, ur 5 (*)    All other components within normal limits  LIPASE, BLOOD     MDM   Clinical Course as of 02/15/23 1547  Thu Feb 15, 2023  1322 Patient complaining of ongoing pain that is severe.  Will give another dose of IV pain medication [SM]    Clinical Course User Index [SM] Corena Herter, MD  Patient was given IV morphine, IV fluids.  Plan for CT scan and further imaging to further evaluate for intra-abdominal pathology.  On reevaluation patient states that he is feeling much better, no longer with severe pain.  Did have findings of mild dehydration.  No signs of urinary tract infection or pyelonephritis.  No findings of kidney stone.  No cyst that was noted in the past on his kidney.  No signs of diverticulitis or bowel obstruction.  On reevaluation patient is feeling much better.  Complaining of allergies and feeling congested especially around the cats next-door.  Will start on an allergy medication.  Encouraged on smoking cessation.  Discussed return precautions for any worsening symptoms.  Discussed close follow-up with primary care provider.  States that he has been told in the past that he needs a colonoscopy, encouraged to follow-up for his colonoscopy.  Discussed return to the emergency department if his symptoms return.    PROCEDURES:  Critical Care performed: No  Procedures  Patient's presentation is most consistent with acute presentation with potential threat to life or bodily function.   MEDICATIONS ORDERED IN ED: Medications  morphine (PF) 4 MG/ML injection 4 mg (4 mg Intravenous Given 02/15/23 1223)  sodium chloride 0.9 % bolus 500 mL (0 mLs Intravenous Stopped 02/15/23 1322)   iohexol (OMNIPAQUE) 300 MG/ML solution 100 mL (100 mLs Intravenous Contrast Given 02/15/23 1244)  HYDROmorphone (DILAUDID) injection 0.5 mg (0.5 mg Intravenous Given 02/15/23 1320)    FINAL CLINICAL IMPRESSION(S) / ED DIAGNOSES   Final diagnoses:  Left flank pain     Rx / DC Orders   ED Discharge Orders          Ordered    cetirizine (ZYRTEC ALLERGY) 10 MG tablet  Daily        02/15/23 1537             Note:  This document was prepared using Dragon voice recognition software and may include unintentional dictation errors.   Corena Herter, MD 02/15/23 1547

## 2023-02-15 NOTE — Discharge Instructions (Addendum)
You were seen in the emergency department for left-sided abdominal pain.  You had a CT scan done that did not show any abnormalities today.  There were no cyst noted on your kidney.  You did not have any findings of a bowel obstruction.  Your lab work was overall normal however you did appear mildly dehydrated.  You are given pain medication and IV fluids.  Your pain significantly improved while in the emergency department.  You are given a prescription for an allergy medication.  It is important that you try to stop smoking cigarettes.  Follow-up closely with your primary care physician and return to the emergency department if you have any ongoing or return of your pain.  You can take 1000 mg of Tylenol every 6 hours for pain control.  Thank you for choosing [place] for your health care, it was my pleasure to care for you today!  Corena Herter, MD

## 2023-02-15 NOTE — ED Triage Notes (Signed)
Pt to ED via [facility] from home. Pt reports left sided flank pain. Pt reports hx of diverticulitis. Pt reports N/D. Pt reports symptoms present x2 days. Pt reports some SOB w/ exertion

## 2023-05-30 ENCOUNTER — Emergency Department
Admission: EM | Admit: 2023-05-30 | Discharge: 2023-05-30 | Disposition: A | Discharge status: Home / Self Care | Payer: MEDICAID | Attending: Emergency Medicine | Admitting: Emergency Medicine

## 2023-05-30 ENCOUNTER — Other Ambulatory Visit: Payer: Self-pay

## 2023-05-30 DIAGNOSIS — I10 Essential (primary) hypertension: Secondary | ICD-10-CM | POA: Diagnosis not present

## 2023-05-30 DIAGNOSIS — R059 Cough, unspecified: Secondary | ICD-10-CM | POA: Diagnosis present

## 2023-05-30 DIAGNOSIS — J069 Acute upper respiratory infection, unspecified: Secondary | ICD-10-CM | POA: Insufficient documentation

## 2023-05-30 LAB — RESP PANEL BY RT-PCR (RSV, FLU A&B, COVID)  RVPGX2
Influenza A by PCR: NEGATIVE
Influenza B by PCR: NEGATIVE
Resp Syncytial Virus by PCR: NEGATIVE
SARS Coronavirus 2 by RT PCR: NEGATIVE

## 2023-05-30 LAB — GROUP A STREP BY PCR: Group A Strep by PCR: NOT DETECTED

## 2023-05-30 MED ORDER — HYDROCOD POLI-CHLORPHE POLI ER 10-8 MG/5ML PO SUER
5.0000 mL | Freq: Once | ORAL | Status: AC
  Administered 2023-05-30: 5 mL via ORAL
  Filled 2023-05-30: qty 5

## 2023-05-30 MED ORDER — IPRATROPIUM-ALBUTEROL 0.5-2.5 (3) MG/3ML IN SOLN
3.0000 mL | Freq: Once | RESPIRATORY_TRACT | Status: AC
  Administered 2023-05-30: 3 mL via RESPIRATORY_TRACT
  Filled 2023-05-30: qty 3

## 2023-05-30 MED ORDER — ALBUTEROL SULFATE HFA 108 (90 BASE) MCG/ACT IN AERS: 2.0000 | INHALATION_SPRAY | Freq: Four times a day (QID) | RESPIRATORY_TRACT | 2 refills | Status: AC | PRN

## 2023-05-30 MED ORDER — GUAIFENESIN-CODEINE 100-10 MG/5ML PO SOLN: 5.0000 mL | Freq: Four times a day (QID) | ORAL | 0 refills | Status: AC | PRN

## 2023-05-30 NOTE — ED Triage Notes (Signed)
 Coughing, chills, sore throat x 3 days. Feels weak and fatigued.

## 2023-05-30 NOTE — ED Provider Notes (Signed)
   [facility] Provider Note    Event Date/Time   First MD Initiated Contact with Patient 05/30/23 2207     (approximate)  History   Chief Complaint: Sore Throat and Cough  HPI  Manuel Harmon is a 53 y.o. male with a past medical anxiety, gastric reflux, hypertension, presents to the emergency department for 3 days of cough congestion.  According to patient for the past 3 days he has had a cough as well as nasal and sinus congestion.  States somewhat of a sore throat as well has been feeling fatigued.  Patient denies any chest pain but does state some tightness when trying to take a deep breath.  Temperature of 99.2 in the emergency department 97% on room air.  Physical Exam   Triage Vital Signs: ED Triage Vitals [05/30/23 1841]  Encounter Vitals Group     BP (!) 140/100     Systolic BP Percentile      Diastolic BP Percentile      Pulse Rate 91     Resp 18     Temp 99.2 F (37.3 C)     Temp Source Oral     SpO2 97 %     Weight 202 lb 13.2 oz (92 kg)     Height      Head Circumference      Peak Flow      Pain Score 7     Pain Loc      Pain Education      Exclude from Growth Chart     Most recent vital signs: Vitals:   05/30/23 1841  BP: (!) 140/100  Pulse: 91  Resp: 18  Temp: 99.2 F (37.3 C)  SpO2: 97%    General: Awake, no distress.  Mild rhinorrhea. CV:  Good peripheral perfusion.  Regular rate and rhythm  Resp:  Normal effort.  Equal breath sounds bilaterally.  Very slight end expiratory wheeze bilaterally.  Occasional cough.   ED Results / Procedures / Treatments   MEDICATIONS ORDERED IN ED: Medications  chlorpheniramine-HYDROcodone (TUSSIONEX) 10-8 MG/5ML suspension 5 mL (has no administration in time range)  ipratropium-albuterol (DUONEB) 0.5-2.5 (3) MG/3ML nebulizer solution 3 mL (has no administration in time range)     IMPRESSION / MDM / ASSESSMENT AND PLAN / ED COURSE  I reviewed the triage vital signs and the nursing  notes.  Patient's presentation is most consistent with acute illness / injury with system symptoms.  Patient presents to the emergency department for 3 days of cough congestion fatigue.  Overall patient appears well, does have an occasional cough slight end expiratory wheeze consistent with upper respiratory infection.  Patient denies any history of reactive airway disease/asthma previously.  Patient's respiratory panel is negative, strep test is negative.  Given the patient's upper respiratory symptoms and mild end expiratory wheeze we will dose a DuoNeb as well as a cough medication.  Will discharge with albuterol and codeine/guaifenesin.  Discussed supportive care otherwise and will have the patient follow-up with his doctor.  Provided my typical URI return precautions.  FINAL CLINICAL IMPRESSION(S) / ED DIAGNOSES   Upper respiratory infection  Note:  This document was prepared using Dragon voice recognition software and may include unintentional dictation errors.   Minna Antis, MD 05/30/23 2230

## 2023-11-07 ENCOUNTER — Other Ambulatory Visit: Payer: Self-pay

## 2023-11-07 ENCOUNTER — Emergency Department
Admission: EM | Admit: 2023-11-07 | Discharge: 2023-11-07 | Disposition: A | Discharge status: Home / Self Care | Payer: MEDICAID | Attending: Emergency Medicine | Admitting: Emergency Medicine

## 2023-11-07 DIAGNOSIS — I10 Essential (primary) hypertension: Secondary | ICD-10-CM | POA: Diagnosis not present

## 2023-11-07 DIAGNOSIS — Z720 Tobacco use: Secondary | ICD-10-CM | POA: Diagnosis not present

## 2023-11-07 DIAGNOSIS — R051 Acute cough: Secondary | ICD-10-CM | POA: Diagnosis not present

## 2023-11-07 DIAGNOSIS — K0889 Other specified disorders of teeth and supporting structures: Secondary | ICD-10-CM | POA: Diagnosis present

## 2023-11-07 DIAGNOSIS — K029 Dental caries, unspecified: Secondary | ICD-10-CM | POA: Diagnosis not present

## 2023-11-07 LAB — RESP PANEL BY RT-PCR (RSV, FLU A&B, COVID)  RVPGX2
Influenza A by PCR: NEGATIVE
Influenza B by PCR: NEGATIVE
Resp Syncytial Virus by PCR: NEGATIVE
SARS Coronavirus 2 by RT PCR: NEGATIVE

## 2023-11-07 LAB — GROUP A STREP BY PCR: Group A Strep by PCR: NOT DETECTED

## 2023-11-07 MED ORDER — CLINDAMYCIN HCL 300 MG PO CAPS: 300.0000 mg | ORAL_CAPSULE | Freq: Three times a day (TID) | ORAL | 0 refills | Status: AC

## 2023-11-07 MED ORDER — CHLORHEXIDINE GLUCONATE 0.12 % MT SOLN: 15.0000 mL | Freq: Two times a day (BID) | OROMUCOSAL | 0 refills | Status: AC

## 2023-11-07 MED ORDER — ACETAMINOPHEN 325 MG PO TABS
650.0000 mg | ORAL_TABLET | Freq: Once | ORAL | Status: AC
Start: 2023-11-07 — End: 2023-11-07
  Administered 2023-11-07: 650 mg via ORAL
  Filled 2023-11-07: qty 2

## 2023-11-07 NOTE — Discharge Instructions (Signed)
 Your COVID/flu/RSV and strep swabs were negative.  Please take the antibiotics as described.  Please return for any new, worsening, or change in symptoms or other concerns.  It was a pleasure caring for you today.

## 2023-11-07 NOTE — ED Triage Notes (Signed)
 Patient states swelling and dental pain to upper and lower gums; patient reports these symptoms have made him weak.

## 2023-11-07 NOTE — ED Provider Notes (Signed)
 [facility] Provider Note    Event Date/Time   First MD Initiated Contact with Patient 11/07/23 1031     (approximate)   History   Dental Pain   HPI  Manuel Harmon is a 53 y.o. male with a past medical history of depression, anxiety who presents today for evaluation of cough, nasal congestion, and dental pain.  Patient reports that this has been ongoing for the past 2 days.  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  He has not had any fevers or chills.  He has not had any difficulty swallowing or voice change.  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   Anxiety disorder 03/13/2014   PTSD (post-traumatic stress disorder) 03/13/2014   Cocaine use disorder, mild, abuse (HCC) 03/13/2014   Cannabis use disorder, moderate, dependence (HCC) 03/13/2014   Suture reaction 01/15/2014          Physical Exam   Triage Vital Signs: ED Triage Vitals  Encounter Vitals Group     BP 11/07/23 1000 (!) 150/104     Girls Systolic BP Percentile --      Girls Diastolic BP Percentile --      Boys Systolic BP Percentile --      Boys Diastolic BP Percentile --      Pulse Rate 11/07/23 1000 89     Resp 11/07/23 1000 18     Temp 11/07/23 1000 98.5 F (36.9 C)     Temp Source 11/07/23 1000 Oral     SpO2 11/07/23 1000 94 %     Weight --      Height --      Head Circumference --      Peak Flow --      Pain Score 11/07/23 0959 10     Pain Loc --      Pain Education --      Exclude from Growth Chart --     Most recent vital signs: Vitals:   11/07/23 1000  BP: (!) 150/104  Pulse: 89  Resp: 18  Temp: 98.5 F (36.9 C)  SpO2: 94%    Physical Exam Vitals and nursing note reviewed.   Constitutional:      General: Awake and alert. No acute distress.    Appearance: Normal appearance. The patient is normal weight.  HENT:     Head: Normocephalic and atraumatic.     Mouth: Mucous membranes are moist. Uvula midline.  No tonsillar exudate.  Mild posterior oropharyngeal erythema.  No soft palate fluctuance.  No trismus.  No voice change.  No sublingual swelling.  No tender cervical lymphadenopathy.  No nuchal rigidity Poor dentition diffusely.  Tenderness to palpation to upper posterior gum lines where posterior molars are missing.  No sublingual swelling or woodiness. Eyes:     General: PERRL. Normal EOMs        Right eye: No discharge.        Left eye: No discharge.     Conjunctiva/sclera: Conjunctivae normal.  Cardiovascular:     Rate and Rhythm: Normal rate and regular rhythm.     Pulses: Normal pulses.  Pulmonary:     Effort: Pulmonary effort is normal. No respiratory distress.  Breath sounds: Normal breath sounds.  Abdominal:     Abdomen is soft. There is no abdominal tenderness. No rebound or guarding. No distention. Musculoskeletal:        General: No swelling. Normal range of motion.     Cervical back: Normal range of motion and neck supple.  Skin:    General: Skin is warm and dry.     Capillary Refill: Capillary refill takes less than 2 seconds.     Findings: No rash.  Neurological:     Mental Status: The patient is awake and alert.      ED Results / Procedures / Treatments   Labs (all labs ordered are listed, but only abnormal results are displayed) Labs Reviewed  RESP PANEL BY RT-PCR (RSV, FLU A&B, COVID)  [id number]  GROUP A STREP BY PCR     EKG     RADIOLOGY     PROCEDURES:  Critical Care performed:   Procedures   MEDICATIONS ORDERED IN ED: Medications  acetaminophen  (TYLENOL ) tablet 650 mg (650 mg Oral Given 11/07/23 1049)     IMPRESSION / MDM / ASSESSMENT AND PLAN / ED COURSE  I reviewed the triage vital signs and the  nursing notes.   Differential diagnosis includes, but is not limited to, dental infection, pulpitis, abscess, tonsillitis, COVID-19, influenza, strep pharyngitis.  Patient is awake and alert, hemodynamically stable and afebrile.  He is nontoxic in appearance.  Given his concurrent complaints of cough and nasal congestion swabs obtained which were negative.  Lungs are clear to auscultation bilaterally, no fever, do not suspect pneumonia.  He does have tenderness to the gums in the upper teeth where his posterior molars are missing, will treat for possible dental etiology. No gingival swelling or fluctuance concerning for gingival abscess.  No trismus, nuchal rigidity, neck pain, hot potato voice, uvular deviation or malocclusion to suggest deep space infection. No sublingual swelling concerning for Ludwig's angina.  Patient was started on antibiotics and chlorhexidine  mouth rinse.  Patient was treated symptomatically in the emergency department. Discussed care plan, return precautions, and advised close outpatient follow-up with dentist/outpatient provider. Patient agrees with plan of care.  Patient's presentation is most consistent with acute complicated illness / injury requiring diagnostic workup.    FINAL CLINICAL IMPRESSION(S) / ED DIAGNOSES   Final diagnoses:  Pain due to dental caries  Acute cough     Rx / DC Orders   ED Discharge Orders          Ordered    clindamycin  (CLEOCIN ) 300 MG capsule  3 times daily        11/07/23 1159    chlorhexidine  (PERIDEX ) 0.12 % solution  2 times daily        11/07/23 1159             Note:  This document was prepared using Dragon voice recognition software and may include unintentional dictation errors.   Richey Doolittle E, PA-C 11/07/23 1215    Arlander Charleston, MD 11/07/23 [phone]

## 2023-12-04 ENCOUNTER — Emergency Department: Payer: MEDICAID

## 2023-12-04 ENCOUNTER — Other Ambulatory Visit: Payer: Self-pay

## 2023-12-04 ENCOUNTER — Emergency Department
Admission: EM | Admit: 2023-12-04 | Discharge: 2023-12-04 | Disposition: A | Discharge status: Home / Self Care | Payer: MEDICAID | Attending: Emergency Medicine | Admitting: Emergency Medicine

## 2023-12-04 DIAGNOSIS — Z720 Tobacco use: Secondary | ICD-10-CM | POA: Insufficient documentation

## 2023-12-04 DIAGNOSIS — D72829 Elevated white blood cell count, unspecified: Secondary | ICD-10-CM | POA: Insufficient documentation

## 2023-12-04 DIAGNOSIS — I10 Essential (primary) hypertension: Secondary | ICD-10-CM | POA: Diagnosis not present

## 2023-12-04 DIAGNOSIS — K5732 Diverticulitis of large intestine without perforation or abscess without bleeding: Secondary | ICD-10-CM | POA: Diagnosis not present

## 2023-12-04 DIAGNOSIS — K5792 Diverticulitis of intestine, part unspecified, without perforation or abscess without bleeding: Secondary | ICD-10-CM

## 2023-12-04 DIAGNOSIS — R1033 Periumbilical pain: Secondary | ICD-10-CM | POA: Diagnosis present

## 2023-12-04 LAB — COMPREHENSIVE METABOLIC PANEL WITH GFR
ALT: 11 U/L (ref 0–44)
AST: 20 U/L (ref 15–41)
Albumin: 3.8 g/dL (ref 3.5–5.0)
Alkaline Phosphatase: 77 U/L (ref 38–126)
Anion gap: 7 (ref 5–15)
BUN: 13 mg/dL (ref 6–20)
CO2: 26 mmol/L (ref 22–32)
Calcium: 9.1 mg/dL (ref 8.9–10.3)
Chloride: 104 mmol/L (ref 98–111)
Creatinine, Ser: 1.4 mg/dL — ABNORMAL HIGH (ref 0.61–1.24)
GFR, Estimated: >60 mL/min (ref >60)
Glucose, Bld: 159 mg/dL — ABNORMAL HIGH (ref 70–99)
Potassium: 4.7 mmol/L (ref 3.5–5.1)
Sodium: 137 mmol/L (ref 135–145)
Total Bilirubin: 0.9 mg/dL (ref 0.0–1.2)
Total Protein: 7.4 g/dL (ref 6.5–8.1)

## 2023-12-04 LAB — URINALYSIS, ROUTINE W REFLEX MICROSCOPIC
Bilirubin Urine: NEGATIVE
Glucose, UA: NEGATIVE mg/dL
Ketones, ur: NEGATIVE mg/dL
Nitrite: NEGATIVE
Protein, ur: NEGATIVE mg/dL
Specific Gravity, Urine: 1.025 (ref 1.005–1.030)
pH: 5 (ref 5.0–8.0)

## 2023-12-04 LAB — CBC
HCT: 44.4 % (ref 39.0–52.0)
Hemoglobin: 14.7 g/dL (ref 13.0–17.0)
MCH: 29.3 pg (ref 26.0–34.0)
MCHC: 33.1 g/dL (ref 30.0–36.0)
MCV: 88.6 fL (ref 80.0–100.0)
Platelets: 326 K/uL (ref 150–400)
RBC: 5.01 MIL/uL (ref 4.22–5.81)
RDW: 15.1 % (ref 11.5–15.5)
WBC: 11.5 K/uL — ABNORMAL HIGH (ref 4.0–10.5)
nRBC: 0 % (ref 0.0–0.2)

## 2023-12-04 LAB — LIPASE, BLOOD: Lipase: 24 U/L (ref 11–51)

## 2023-12-04 MED ORDER — MORPHINE SULFATE (PF) 4 MG/ML IV SOLN
4.0000 mg | Freq: Once | INTRAVENOUS | Status: AC
  Administered 2023-12-04: 4 mg via INTRAVENOUS
  Filled 2023-12-04: qty 1

## 2023-12-04 MED ORDER — CIPROFLOXACIN HCL 500 MG PO TABS: 750.0000 mg | ORAL_TABLET | Freq: Two times a day (BID) | ORAL | 0 refills | Status: AC

## 2023-12-04 MED ORDER — IOHEXOL 300 MG/ML  SOLN
100.0000 mL | Freq: Once | INTRAMUSCULAR | Status: AC | PRN
Start: 2023-12-04 — End: 2023-12-04
  Administered 2023-12-04: 100 mL via INTRAVENOUS

## 2023-12-04 MED ORDER — METRONIDAZOLE 500 MG PO TABS: 500.0000 mg | ORAL_TABLET | Freq: Four times a day (QID) | ORAL | 0 refills | Status: AC

## 2023-12-04 NOTE — ED Triage Notes (Signed)
 Pt to ED for sharp periumbilical abdominal pain 2 days. Hx diverticulitis. Has had pain since Sunday after eating sushi. LBM today, small.

## 2023-12-04 NOTE — ED Provider Notes (Signed)
 [facility] Provider Note    Event Date/Time   First MD Initiated Contact with Patient 12/04/23 1043     (approximate)   History   Abdominal Pain   HPI  Manuel Harmon is a 53 y.o. male with a past medical history of diverticulitis status post partial colectomy and appendectomy who presents today for evaluation of periumbilical abdominal pain for the past 2 days.  Patient reports that his symptoms began after eating sushi on Sunday.  He reports that he has also had loose stools.  No nausea or vomiting.  He feels like he has had a fever but he has not taken his temperature.  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   Anxiety disorder 03/13/2014   PTSD (post-traumatic stress disorder) 03/13/2014   Cocaine use disorder, mild, abuse (HCC) 03/13/2014   Cannabis use disorder, moderate, dependence (HCC) 03/13/2014   Suture reaction 01/15/2014          Physical Exam   Triage Vital Signs: ED Triage Vitals  Encounter Vitals Group     BP 12/04/23 1040 (!) 144/97     Girls Systolic BP Percentile --      Girls Diastolic BP Percentile --      Boys Systolic BP Percentile --      Boys Diastolic BP Percentile --      Pulse Rate 12/04/23 1040 89     Resp 12/04/23 1040 20     Temp 12/04/23 1040 99.5 F (37.5 C)     Temp Source 12/04/23 1040 Oral     SpO2 12/04/23 1040 99 %     Weight 12/04/23 1038 205 lb (93 kg)     Height 12/04/23 1038 5' 11 (1.803 m)     Head Circumference --      Peak Flow --      Pain Score 12/04/23 1036 10     Pain Loc --      Pain Education --      Exclude from Growth Chart --     Most recent vital signs: Vitals:   12/04/23 1040  BP: (!) 144/97  Pulse: 89  Resp: 20  Temp: 99.5 F (37.5  C)  SpO2: 99%    Physical Exam Vitals and nursing note reviewed.  Constitutional:      General: Awake and alert. No acute distress, though appears mildly uncomfortable    Appearance: Normal appearance. The patient is normal weight.  HENT:     Head: Normocephalic and atraumatic.     Mouth: Mucous membranes are moist.  Eyes:     General: PERRL. Normal EOMs        Right eye: No discharge.        Left eye: No discharge.     Conjunctiva/sclera: Conjunctivae normal.  Cardiovascular:     Rate and Rhythm: Normal rate and regular rhythm.     Pulses: Normal pulses.  Pulmonary:     Effort: Pulmonary effort is normal. No respiratory distress.     Breath sounds: Normal breath sounds.  Abdominal:     Abdomen is soft.  Large midline surgical incision.  Well-healed.  There is periumbilical abdominal tenderness, and tenderness along the surgical scar. No rebound or guarding. No distention. Musculoskeletal:  General: No swelling. Normal range of motion.     Cervical back: Normal range of motion and neck supple.  Skin:    General: Skin is warm and dry.     Capillary Refill: Capillary refill takes less than 2 seconds.     Findings: No rash.  Neurological:     Mental Status: The patient is awake and alert.      ED Results / Procedures / Treatments   Labs (all labs ordered are listed, but only abnormal results are displayed) Labs Reviewed  COMPREHENSIVE METABOLIC PANEL WITH GFR - Abnormal; Notable for the following components:      Result Value   Glucose, Bld 159 (*)    Creatinine, Ser 1.40 (*)    All other components within normal limits  CBC - Abnormal; Notable for the following components:   WBC 11.5 (*)    All other components within normal limits  URINALYSIS, ROUTINE W REFLEX MICROSCOPIC - Abnormal; Notable for the following components:   Color, Urine YELLOW (*)    APPearance HAZY (*)    Hgb urine dipstick MODERATE (*)    Leukocytes,Ua TRACE (*)    Bacteria, UA RARE (*)     All other components within normal limits  LIPASE, BLOOD     EKG     RADIOLOGY I independently reviewed and interpreted imaging and agree with radiologists findings.     PROCEDURES:  Critical Care performed:   Procedures   MEDICATIONS ORDERED IN ED: Medications  iohexol  (OMNIPAQUE ) 300 MG/ML solution 100 mL (100 mLs Intravenous Contrast Given 12/04/23 1144)  morphine  (PF) 4 MG/ML injection 4 mg (4 mg Intravenous Given 12/04/23 1203)  morphine  (PF) 4 MG/ML injection 4 mg (4 mg Intravenous Given 12/04/23 1331)     IMPRESSION / MDM / ASSESSMENT AND PLAN / ED COURSE  I reviewed the triage vital signs and the nursing notes.   Differential diagnosis includes, but is not limited to, diverticulitis, surgical complication, perforation, abscess, hernia.  Patient is awake and alert, hemodynamically stable and afebrile.  He appears to be uncomfortable, though nontoxic.  Labs obtained reveal leukocytosis to 11.1, otherwise reassuring.  He was treated with morphine  for pain control with good effect.  He is reportedly unable to take NSAIDs.  I reviewed the patient's chart.  I am unable to see the operative note from his previous surgery.  CT scan obtained for further evaluation reveals diverticulitis, uncomplicated, of the ascending colon at the hepatic flexure which is where his tenderness is primarily.  Will treat with antibiotics.  Given that he has an allergy to Augmentin, will do Cipro /Flagyl  instead.  Recommended close outpatient follow-up with general surgery and the appropriate follow-up information was provided.  We also discussed that uncomplicated diverticulitis can turn into complicated diverticulitis, so we discussed very strict return precautions as well.  Incidental findings also discussed.  Patient understands and agrees with plan.  He was discharged in stable condition.   Patient's presentation is most consistent with acute presentation with potential threat to life or  bodily function.   Clinical Course as of 12/04/23 1435  Tue Dec 04, 2023  1304 Patient reevaluated.  Reports that he feels improved but would like 1 more dose of morphine . [JP]    Clinical Course User Index [JP] Timur Nibert E, PA-C     FINAL CLINICAL IMPRESSION(S) / ED DIAGNOSES   Final diagnoses:  Diverticulitis     Rx / DC Orders   ED Discharge Orders  Ordered    metroNIDAZOLE  (FLAGYL ) 500 MG tablet  4 times daily        12/04/23 1411    ciprofloxacin  (CIPRO ) 500 MG tablet  2 times daily        12/04/23 1411             Note:  This document was prepared using Dragon voice recognition software and may include unintentional dictation errors.   Emelynn Rance E, PA-C 12/04/23 1435    Viviann Pastor, MD 12/05/23 1140

## 2023-12-04 NOTE — ED Notes (Signed)
Discharged by other RN.

## 2023-12-04 NOTE — ED Notes (Signed)
 See triage note Presents with mid abd pain  States pain started 2 days ago. Low grade temp on arrival Some nausea

## 2023-12-04 NOTE — Discharge Instructions (Signed)
 Please take the antibiotics as prescribed.  Please return for any new, worsening, or change in symptoms or other concerns.  Please follow-up with general surgery.  It was a pleasure caring for you today.

## 2024-01-02 IMAGING — CR DG CHEST 2V
1 series · 2 of 2 positions shown · non-contrast
Comparison: March 23, 2021

CLINICAL DATA: Chest tightness.

EXAM:
CHEST - 2 VIEW

[Series 1: dg chest 2 view · 0.14mm/px · 2 of 2 slices shown]
[im 1/2]
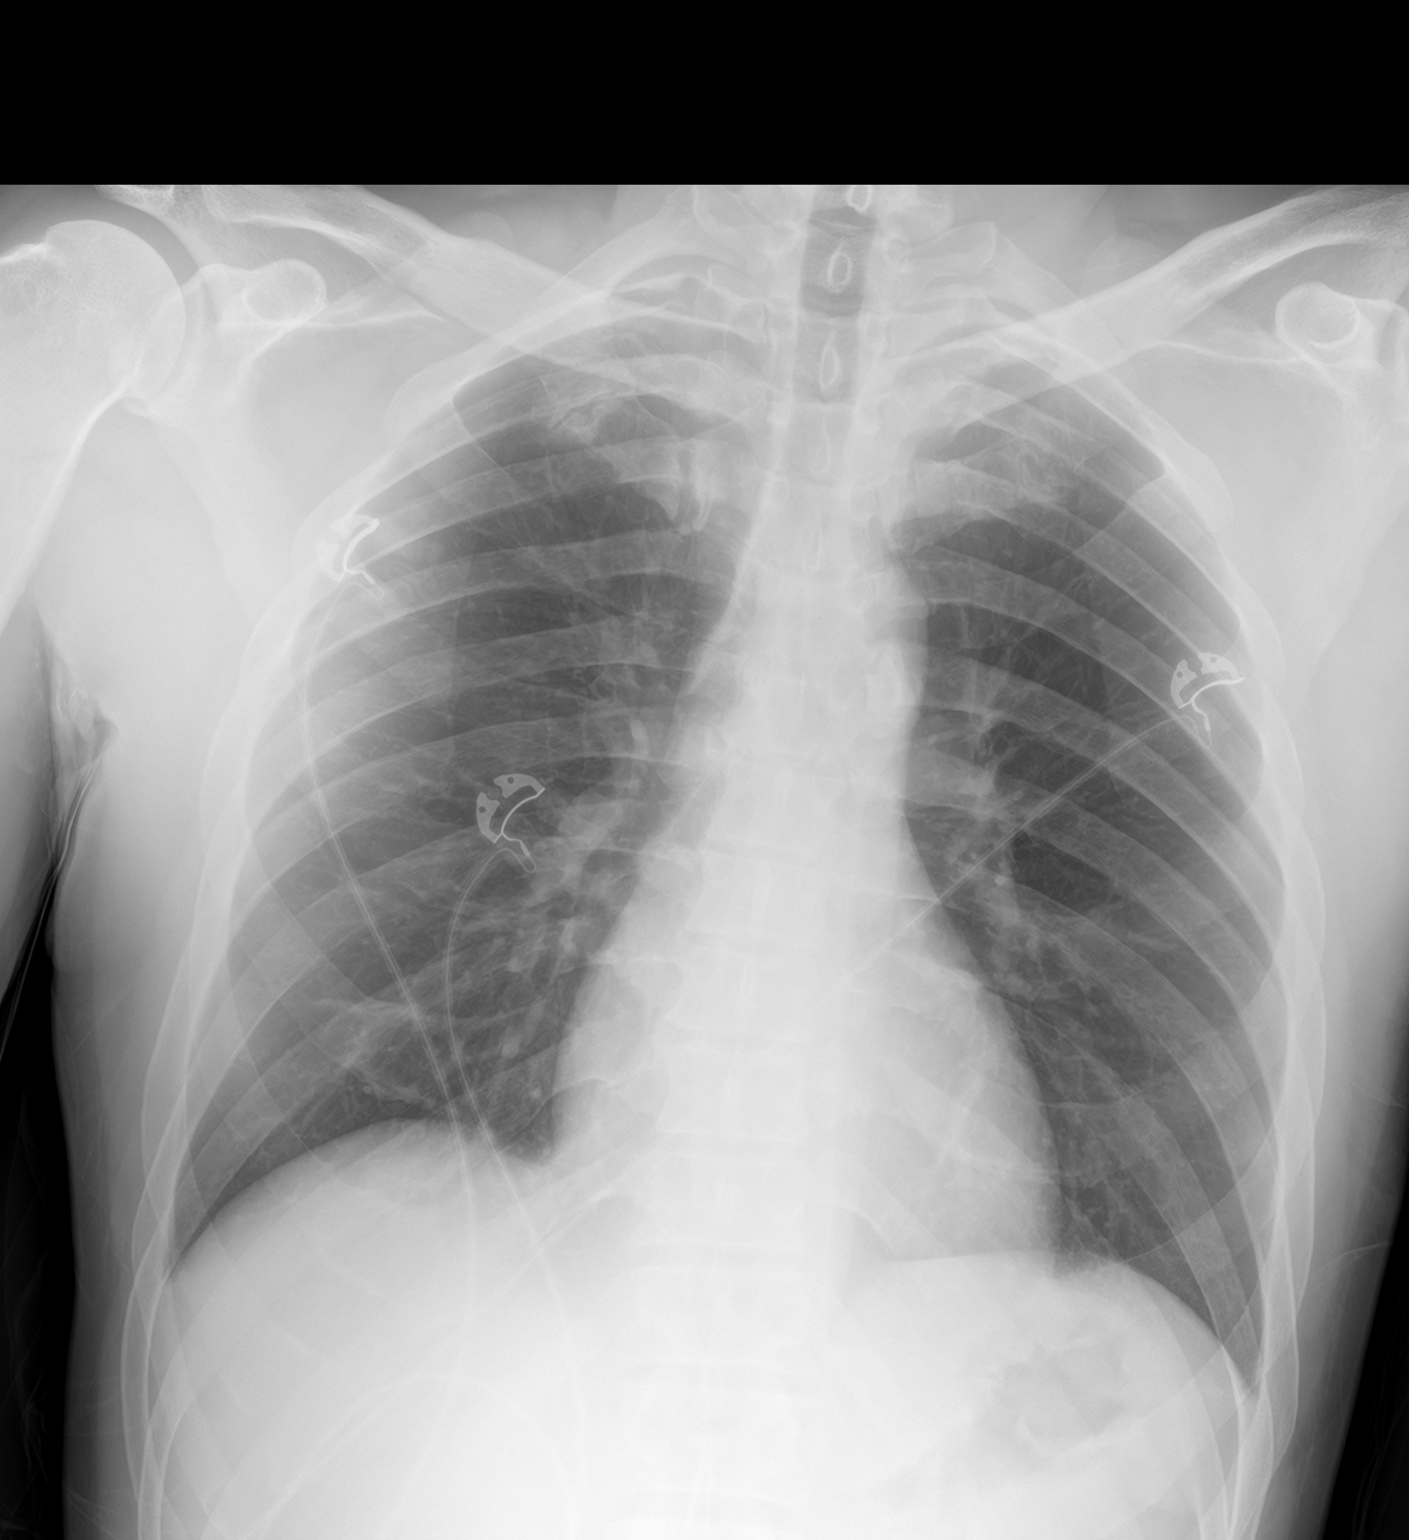
[im 2/2]
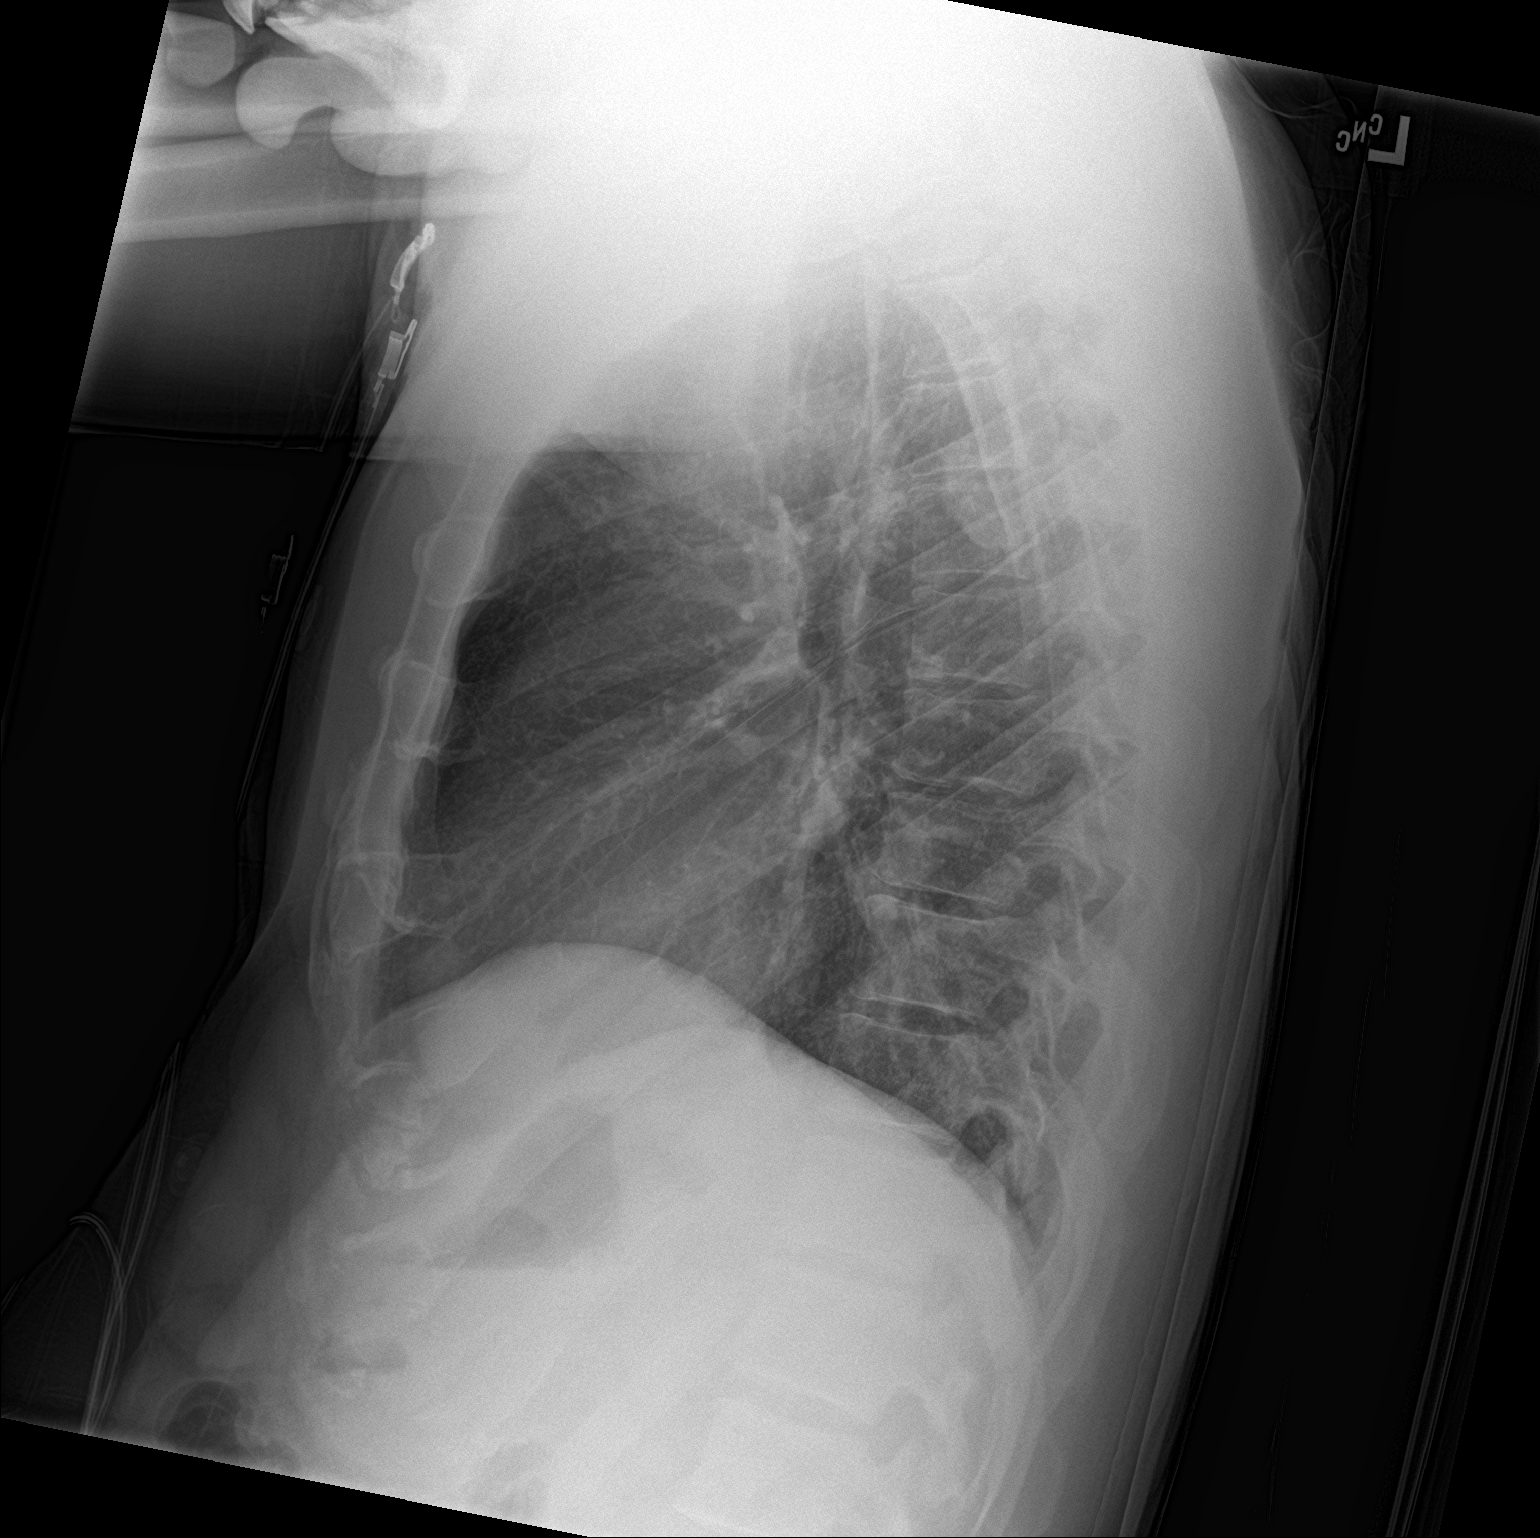

[2 of 2 positions shown; findings below may reference images not displayed]

FINDINGS: The heart size and mediastinal contours are within normal limits.
Both lungs are clear. The visualized skeletal structures are
unremarkable.
IMPRESSION: No active cardiopulmonary disease.

## 2024-01-28 ENCOUNTER — Emergency Department
Admission: EM | Admit: 2024-01-28 | Discharge: 2024-01-28 | Disposition: A | Discharge status: Home / Self Care | Payer: MEDICAID | Attending: Emergency Medicine | Admitting: Emergency Medicine

## 2024-01-28 ENCOUNTER — Emergency Department: Payer: MEDICAID

## 2024-01-28 ENCOUNTER — Other Ambulatory Visit: Payer: Self-pay

## 2024-01-28 DIAGNOSIS — W25XXXA Contact with sharp glass, initial encounter: Secondary | ICD-10-CM | POA: Diagnosis not present

## 2024-01-28 DIAGNOSIS — S61411A Laceration without foreign body of right hand, initial encounter: Secondary | ICD-10-CM | POA: Diagnosis not present

## 2024-01-28 DIAGNOSIS — I10 Essential (primary) hypertension: Secondary | ICD-10-CM | POA: Diagnosis not present

## 2024-01-28 DIAGNOSIS — S6991XA Unspecified injury of right wrist, hand and finger(s), initial encounter: Secondary | ICD-10-CM | POA: Diagnosis present

## 2024-01-28 DIAGNOSIS — Z23 Encounter for immunization: Secondary | ICD-10-CM | POA: Diagnosis not present

## 2024-01-28 MED ORDER — HYDROCODONE-ACETAMINOPHEN 5-325 MG PO TABS
1.0000 | ORAL_TABLET | Freq: Once | ORAL | Status: AC
  Administered 2024-01-28: 1 via ORAL
  Filled 2024-01-28: qty 1

## 2024-01-28 MED ORDER — TETANUS-DIPHTH-ACELL PERTUSSIS 5-2-15.5 LF-MCG/0.5 IM SUSP
0.5000 mL | Freq: Once | INTRAMUSCULAR | Status: AC
  Administered 2024-01-28: 0.5 mL via INTRAMUSCULAR
  Filled 2024-01-28: qty 0.5

## 2024-01-28 MED ORDER — CEPHALEXIN 500 MG PO CAPS: 500.0000 mg | ORAL_CAPSULE | Freq: Two times a day (BID) | ORAL | 0 refills | Status: AC

## 2024-01-28 MED ORDER — LIDOCAINE HCL (PF) 1 % IJ SOLN
5.0000 mL | Freq: Once | INTRAMUSCULAR | Status: AC
  Administered 2024-01-28: 5 mL via INTRADERMAL
  Filled 2024-01-28: qty 5

## 2024-01-28 NOTE — ED Provider Notes (Signed)
 [facility] Provider Note    Event Date/Time   First MD Initiated Contact with Patient 01/28/24 1026     (approximate)   History   Extremity Laceration   HPI  Manuel Harmon is a 53 y.o. male with PMH of HTN, GERD, anxiety and depression who presents for evaluation of a laceration to the right hand. Patient states he cut it on broken glass last night while trying to put together a picture frame. He states he washed it out with hydrogen peroxide. Reports significant pain to the area.      Physical Exam   Triage Vital Signs: ED Triage Vitals  Encounter Vitals Group     BP 01/28/24 1005 (!) 152/89     Girls Systolic BP Percentile --      Girls Diastolic BP Percentile --      Boys Systolic BP Percentile --      Boys Diastolic BP Percentile --      Pulse Rate 01/28/24 1005 94     Resp 01/28/24 1005 18     Temp 01/28/24 1005 98.8 F (37.1 C)     Temp Source 01/28/24 1005 Oral     SpO2 01/28/24 1005 96 %     Weight 01/28/24 1007 195 lb (88.5 kg)     Height 01/28/24 1007 5' 10 (1.778 m)     Head Circumference --      Peak Flow --      Pain Score 01/28/24 1005 10     Pain Loc --      Pain Education --      Exclude from Growth Chart --     Most recent vital signs: Vitals:   01/28/24 1005  BP: (!) 152/89  Pulse: 94  Resp: 18  Temp: 98.8 F (37.1 C)  SpO2: 96%   General: Awake, no distress.  CV:  Good peripheral perfusion.  Resp:  Normal effort.  Abd:  No distention.  Other:  Approx 2 cm L shaped laceration between the thumb and index finger with small amount of subcutaneous fat exposed, no active bleeding, able to flex and extend all fingers, neurovascularly intact distal to the wound   ED Results / Procedures / Treatments   Labs (all labs ordered are listed, but only abnormal results are displayed) Labs Reviewed - No data to display  RADIOLOGY  Right hand xray obtained, I interpreted the images as well as reviewed the radiologist  report, images were negative for retained foreign body.  PROCEDURES:  Critical Care performed: No  .Laceration Repair  Date/Time: 01/28/2024 11:32 AM  Performed by: Cleaster Tinnie LABOR, PA-C Authorized by: Cleaster Tinnie LABOR, PA-C   Consent:    Consent obtained:  Verbal   Consent given by:  Patient   Risks, benefits, and alternatives were discussed: yes     Risks discussed:  Infection, pain, retained foreign body and poor cosmetic result   Alternatives discussed:  No treatment Universal protocol:    Patient identity confirmed:  Verbally with patient Anesthesia:    Anesthesia method:  Local infiltration   Local anesthetic:  Lidocaine  1% w/o epi Laceration details:    Location:  Hand   Hand location:  R palm   Length (cm):  3   Depth (mm):  8 Pre-procedure details:    Preparation:  Patient was prepped and draped in usual sterile fashion and imaging obtained to evaluate for foreign bodies Exploration:    Limited defect created (wound extended): no  Hemostasis achieved with:  Direct pressure   Imaging obtained: x-ray     Imaging outcome: foreign body not noted     Wound exploration: wound explored through full range of motion and entire depth of wound visualized     Contaminated: no   Treatment:    Area cleansed with:  Povidone-iodine   Amount of cleaning:  Standard   Irrigation solution:  Sterile saline   Irrigation method:  Syringe   Debridement:  Minimal Skin repair:    Repair method:  Sutures   Suture size:  5-0   Suture material:  Nylon   Suture technique:  Simple interrupted   Number of sutures:  4 Approximation:    Approximation:  Close Repair type:    Repair type:  Simple Post-procedure details:    Dressing:  Adhesive bandage   Procedure completion:  Tolerated well, no immediate complications    MEDICATIONS ORDERED IN ED: Medications  lidocaine  (PF) (XYLOCAINE ) 1 % injection 5 mL (5 mLs Intradermal Given by Other 01/28/24 1054)  Tdap (ADACEL)  injection 0.5 mL (0.5 mLs Intramuscular Given 01/28/24 1053)  HYDROcodone -acetaminophen  (NORCO/VICODIN) 5-325 MG per tablet 1 tablet (1 tablet Oral Given 01/28/24 1052)     IMPRESSION / MDM / ASSESSMENT AND PLAN / ED COURSE  I reviewed the triage vital signs and the nursing notes.                             53 year old male presents for evaluation of a laceration to the right palm.  Blood pressure was elevated but patient is quite uncomfortable does have history of hypertension.  Vital signs stable otherwise.  Differential diagnosis includes, but is not limited to, laceration, retained foreign body, less likely tendon/nerve/vascular injury.  Patient's presentation is most consistent with acute complicated illness / injury requiring diagnostic workup.  X-ray of the right hand does not show any retained foreign bodies.  Laceration repaired as described by the procedure note above.  Patient was instructed on wound care.  Will place patient on oral antibiotics since there is risk of microscopic retained foreign body and wound occurred last night.  Discussed signs of infection to watch for.  Tetanus shot was updated today. Patient voiced understanding, all questions were answered and he was stable at discharge.      FINAL CLINICAL IMPRESSION(S) / ED DIAGNOSES   Final diagnoses:  Laceration of right hand, foreign body presence unspecified, initial encounter     Rx / DC Orders   ED Discharge Orders          Ordered    cephALEXin  (KEFLEX ) 500 MG capsule  2 times daily        01/28/24 1136             Note:  This document was prepared using Dragon voice recognition software and may include unintentional dictation errors.   Cleaster Tinnie LABOR, PA-C 01/28/24 1503    Arlander Charleston, MD 01/28/24 1526

## 2024-01-28 NOTE — ED Triage Notes (Signed)
 Pt arrives via POV with c/o a laceration to the right hand. Pt states that they were putting a picture frame together last night and the glass broke and cut their hand. Bleeding is controlled at this time. Pt is A&Ox4 during triage.

## 2024-01-28 NOTE — Discharge Instructions (Addendum)
 The sutures in your hand will need to be removed in 7 to 10 days.  This can be done by your primary care provider, urgent care or the emergency department.  Please wash the wound with soap and water daily, pat dry and then cover with a bandage. Watch for signs of infection including redness, warmth, swelling, pain and pus drainage.  If you develop any of these please return to the ED, urgent care or your primary care provider.  I have sent antibiotics for infection prevention.  Please take them as prescribed.  Make sure you take all of the medication.

## 2024-03-12 ENCOUNTER — Emergency Department
Admission: EM | Admit: 2024-03-12 | Discharge: 2024-03-12 | Disposition: A | Discharge status: Home / Self Care | Payer: MEDICAID | Attending: Emergency Medicine | Admitting: Emergency Medicine

## 2024-03-12 ENCOUNTER — Emergency Department: Payer: MEDICAID

## 2024-03-12 DIAGNOSIS — J069 Acute upper respiratory infection, unspecified: Secondary | ICD-10-CM | POA: Diagnosis not present

## 2024-03-12 DIAGNOSIS — Z716 Tobacco abuse counseling: Secondary | ICD-10-CM | POA: Diagnosis not present

## 2024-03-12 DIAGNOSIS — F17200 Nicotine dependence, unspecified, uncomplicated: Secondary | ICD-10-CM | POA: Diagnosis not present

## 2024-03-12 DIAGNOSIS — R6889 Other general symptoms and signs: Secondary | ICD-10-CM

## 2024-03-12 DIAGNOSIS — R55 Syncope and collapse: Secondary | ICD-10-CM | POA: Diagnosis not present

## 2024-03-12 DIAGNOSIS — I10 Essential (primary) hypertension: Secondary | ICD-10-CM | POA: Insufficient documentation

## 2024-03-12 DIAGNOSIS — R059 Cough, unspecified: Secondary | ICD-10-CM | POA: Diagnosis present

## 2024-03-12 LAB — COMPREHENSIVE METABOLIC PANEL WITH GFR
ALT: 13 U/L (ref 0–44)
AST: 23 U/L (ref 15–41)
Albumin: 4.6 g/dL (ref 3.5–5.0)
Alkaline Phosphatase: 98 U/L (ref 38–126)
Anion gap: 11 (ref 5–15)
BUN: 11 mg/dL (ref 6–20)
CO2: 27 mmol/L (ref 22–32)
Calcium: 9.8 mg/dL (ref 8.9–10.3)
Chloride: 99 mmol/L (ref 98–111)
Creatinine, Ser: 1.56 mg/dL — ABNORMAL HIGH (ref 0.61–1.24)
GFR, Estimated: 53 mL/min — ABNORMAL LOW
Glucose, Bld: 127 mg/dL — ABNORMAL HIGH (ref 70–99)
Potassium: 4.8 mmol/L (ref 3.5–5.1)
Sodium: 137 mmol/L (ref 135–145)
Total Bilirubin: 0.2 mg/dL (ref 0.0–1.2)
Total Protein: 8 g/dL (ref 6.5–8.1)

## 2024-03-12 LAB — CBC WITH DIFFERENTIAL/PLATELET
Abs Immature Granulocytes: 0.02 K/uL (ref 0.00–0.07)
Basophils Absolute: 0 K/uL (ref 0.0–0.1)
Basophils Relative: 0 %
Eosinophils Absolute: 0 K/uL (ref 0.0–0.5)
Eosinophils Relative: 0 %
HCT: 51.4 % (ref 39.0–52.0)
Hemoglobin: 16.8 g/dL (ref 13.0–17.0)
Immature Granulocytes: 0 %
Lymphocytes Relative: 23 %
Lymphs Abs: 1.2 K/uL (ref 0.7–4.0)
MCH: 28.4 pg (ref 26.0–34.0)
MCHC: 32.7 g/dL (ref 30.0–36.0)
MCV: 87 fL (ref 80.0–100.0)
Monocytes Absolute: 0.8 K/uL (ref 0.1–1.0)
Monocytes Relative: 15 %
Neutro Abs: 3.2 K/uL (ref 1.7–7.7)
Neutrophils Relative %: 62 %
Platelets: 309 K/uL (ref 150–400)
RBC: 5.91 MIL/uL — ABNORMAL HIGH (ref 4.22–5.81)
RDW: 14.7 % (ref 11.5–15.5)
WBC: 5.2 K/uL (ref 4.0–10.5)
nRBC: 0 % (ref 0.0–0.2)

## 2024-03-12 LAB — TROPONIN T, HIGH SENSITIVITY
Troponin T High Sensitivity: <15 ng/L (ref 0–19)
Troponin T High Sensitivity: <15 ng/L (ref 0–19)

## 2024-03-12 LAB — MAGNESIUM: Magnesium: 2.2 mg/dL (ref 1.7–2.4)

## 2024-03-12 MED ORDER — NICOTINE POLACRILEX 4 MG MT LOZG: 4.0000 mg | LOZENGE | OROMUCOSAL | 0 refills | Status: AC | PRN

## 2024-03-12 MED ORDER — KETOROLAC TROMETHAMINE 15 MG/ML IJ SOLN
15.0000 mg | Freq: Once | INTRAMUSCULAR | Status: AC
  Administered 2024-03-12: 15 mg via INTRAVENOUS
  Filled 2024-03-12: qty 1

## 2024-03-12 MED ORDER — ONDANSETRON HCL 4 MG/2ML IJ SOLN
4.0000 mg | Freq: Once | INTRAMUSCULAR | Status: AC
  Administered 2024-03-12: 4 mg via INTRAVENOUS
  Filled 2024-03-12: qty 2

## 2024-03-12 MED ORDER — OSELTAMIVIR PHOSPHATE 75 MG PO CAPS: 75.0000 mg | ORAL_CAPSULE | Freq: Two times a day (BID) | ORAL | 0 refills | Status: AC

## 2024-03-12 MED ORDER — NICOTINE 7 MG/24HR TD PT24: 7.0000 mg | MEDICATED_PATCH | Freq: Every day | TRANSDERMAL | 0 refills | Status: AC

## 2024-03-12 MED ORDER — ACETAMINOPHEN 500 MG PO TABS
1000.0000 mg | ORAL_TABLET | Freq: Once | ORAL | Status: AC
  Administered 2024-03-12: 1000 mg via ORAL
  Filled 2024-03-12: qty 2

## 2024-03-12 MED ORDER — SODIUM CHLORIDE 0.9 % IV BOLUS
1000.0000 mL | Freq: Once | INTRAVENOUS | Status: AC
  Administered 2024-03-12: 1000 mL via INTRAVENOUS

## 2024-03-12 NOTE — ED Triage Notes (Signed)
 Pt arrived via EMS from home for weakness for the past 3 days. Pt states tonight he was using the bathroom and became diaphoretic and passed out. Pt states he fell onto the floor but did not hit his head. Pt states he was on the floor for about 5 minutes before he was able to crawl into bed. Also states he has been congested, been having diarrhea and lost of appetite x3days

## 2024-03-12 NOTE — ED Provider Notes (Signed)
 "  [facility] Provider Note    Event Date/Time   First MD Initiated Contact with Patient 03/12/24 0236     (approximate)   History   No chief complaint on file.   HPI  Manuel Harmon is a 53 y.o. male   Past medical history of GERD, hypertension, anxiety and depression, cannabis use, smoker, here with cough congestion myalgia headache and subjective fevers and chills for 2 to 3 day days.  He has also had diarrhea without GI bleeding, and when on the toilet to defecate today he had a flushed warm feeling felt lightheaded and then passed out.  Brief loss of consciousness.  No trauma sustained.  He reports mild substernal chest discomfort/pressure sensation.  He denies shortness of breath or palpitations.  He is up-to-date on childhood vaccinations but did not receive influenza vaccination this year.  Independent Historian contributed to assessment above: His partner is at bedside to corroborate information past medical history as above  External Medical Documents Reviewed: Previous hospital notes      Physical Exam   Triage Vital Signs: ED Triage Vitals  Encounter Vitals Group     BP 03/12/24 0221 (!) 142/91     Girls Systolic BP Percentile --      Girls Diastolic BP Percentile --      Boys Systolic BP Percentile --      Boys Diastolic BP Percentile --      Pulse Rate 03/12/24 0221 91     Resp 03/12/24 0221 20     Temp 03/12/24 0221 98.9 F (37.2 C)     Temp Source 03/12/24 0221 Oral     SpO2 03/12/24 0221 99 %     Weight 03/12/24 0222 202 lb (91.6 kg)     Height 03/12/24 0222 5' 10 (1.778 m)     Head Circumference --      Peak Flow --      Pain Score 03/12/24 0222 10     Pain Loc --      Pain Education --      Exclude from Growth Chart --     Most recent vital signs: Vitals:   03/12/24 0221  BP: (!) 142/91  Pulse: 91  Resp: 20  Temp: 98.9 F (37.2 C)  SpO2: 99%    General: Awake, no distress.  CV:  Good peripheral perfusion.   Resp:  Normal effort.  Abd:  No distention.  Other:  Overall well-appearing nontoxic appearing with normal vital signs a little little bit hypertensive.  He is afebrile.  His neck is supple forage of motion.  His posterior oropharynx appears slightly erythematous but no masses or exudates and airway intact. Benign soft non tender abd exam. Lungs without focality or wheeze. No murmurs.    ED Results / Procedures / Treatments   Labs (all labs ordered are listed, but only abnormal results are displayed) Labs Reviewed  COMPREHENSIVE METABOLIC PANEL WITH GFR - Abnormal; Notable for the following components:      Result Value   Glucose, Bld 127 (*)    Creatinine, Ser 1.56 (*)    GFR, Estimated 53 (*)    All other components within normal limits  CBC WITH DIFFERENTIAL/PLATELET - Abnormal; Notable for the following components:   RBC 5.91 (*)    All other components within normal limits  MAGNESIUM   TROPONIN T, HIGH SENSITIVITY     I ordered and reviewed the above labs they are notable for GFR 53, initial  trop neg  EKG  ED ECG REPORT I, Ginnie Shams, the attending physician, personally viewed and interpreted this ECG.   Date: 03/12/2024  EKG Time: 0307  Rate: 95  Rhythm: sinus  Axis: nl  Intervals:nl  ST&T Change: no stemi    RADIOLOGY I independently reviewed and interpreted cxr and see no focality I also reviewed radiologist's formal read.   PROCEDURES:  Critical Care performed: No  Procedures   MEDICATIONS ORDERED IN ED: Medications  acetaminophen  (TYLENOL ) tablet 1,000 mg (1,000 mg Oral Given 03/12/24 0306)  ketorolac  (TORADOL ) 15 MG/ML injection 15 mg (15 mg Intravenous Given 03/12/24 0306)  ondansetron  (ZOFRAN ) injection 4 mg (4 mg Intravenous Given 03/12/24 0306)  sodium chloride  0.9 % bolus 1,000 mL (1,000 mLs Intravenous New Bag/Given 03/12/24 0312)     IMPRESSION / MDM / ASSESSMENT AND PLAN / ED COURSE  I reviewed the triage vital signs and the nursing  notes.                                Patient's presentation is most consistent with acute presentation with potential threat to life or bodily function.  Differential diagnosis includes, but is not limited to, viral uri, influenza, acs, pe, dissection, sepsis, vasovagal syncope, cardiac syncope   The patient is on the cardiac monitor to evaluate for evidence of arrhythmia and/or significant heart rate changes.  MDM:    Several days of flulike illness we will treat as presumptive positive for influenza given the high rate of this illness in the community at this time.  Given guidance from our departmental leadership, will defer viral testing and treat presumptively at this time unless otherwise healthy patient not requiring hospital admission.  In terms of the syncopal episode while on the toilet, most likely vasovagal.  Will check labs, electrolytes, troponin and EKG.  No trauma sustained from his syncopal episode.  Otherwise looks well.  Given the cough and congestion will check chest x-ray to see if any focality to suggest bacterial pneumonia.  Workup thus far unremarkable with normal white blood cell count, no focality on chest x-ray, will defer antibiotics.  Treat presumptively with Tamiflu  prescription sent to pharmacy.  Give fluids given mild AKI.  Pending the repeat troponin, if normal anticipate discharge  I considered hospitalization for admission or observation however given overall well appearance, very low clinical suspicion for serious bacterial infection or sepsis, likely be managed outpatient with PMD follow-up and return questions.  I spent 5 minutes counseling this patient on smoking cessation.  We spoke about the patient's current tobacco use, impact of smoking, assessed willingness to quit, methods for cessation including medical management and nicotine  replacement therapy (which I prescribed to the patient) and advised follow-up with primary doctor to continue to  address smoking cessation.       FINAL CLINICAL IMPRESSION(S) / ED DIAGNOSES   Final diagnoses:  Flu-like symptoms  Viral URI with cough  Vasovagal syncope  Encounter for smoking cessation counseling     Rx / DC Orders   ED Discharge Orders          Ordered    oseltamivir  (TAMIFLU ) 75 MG capsule  2 times daily        03/12/24 0238    nicotine  (NICODERM CQ  - DOSED IN MG/24 HR) 7 mg/24hr patch  Daily        03/12/24 0238    nicotine  polacrilex (NICOTINE  MINI) 4 MG lozenge  As needed        03/12/24 9761             Note:  This document was prepared using Dragon voice recognition software and may include unintentional dictation errors.    Cyrena Mylar, MD 03/12/24 [phone]  "

## 2024-03-12 NOTE — Discharge Instructions (Addendum)
 Fortunately your evaluation in the emergency department did not show any additional emergency conditions like heart attack or other more dangerous heart conditions that would have accounted for your fainting episode.  You are suffering from a viral upper respiratory tract infection and likely have influenza.  Take Tylenol  650 mg every 6 hours as needed for fever/aches/pains.  Use Tamiflu  for 5 days as prescribed.  Thank you for choosing us  for your health care today!  Please see your primary doctor this week for a follow up appointment.   If you have any new, worsening, or unexpected symptoms call your doctor right away or come back to the emergency department for reevaluation.  It was my pleasure to care for you today.   Ginnie EDISON Cyrena, MD
